# Patient Record
Sex: Female | Born: 1960
Health system: Southern US, Community
[De-identification: ages and names within clinical notes are randomized; demographics above are authoritative.]

## PROBLEM LIST (undated history)

## (undated) DIAGNOSIS — I471 Supraventricular tachycardia: Secondary | ICD-10-CM

## (undated) DIAGNOSIS — L732 Hidradenitis suppurativa: Secondary | ICD-10-CM

## (undated) DIAGNOSIS — R109 Unspecified abdominal pain: Secondary | ICD-10-CM

## (undated) DIAGNOSIS — E785 Hyperlipidemia, unspecified: Secondary | ICD-10-CM

## (undated) DIAGNOSIS — J329 Chronic sinusitis, unspecified: Secondary | ICD-10-CM

## (undated) DIAGNOSIS — I341 Nonrheumatic mitral (valve) prolapse: Secondary | ICD-10-CM

## (undated) DIAGNOSIS — N390 Urinary tract infection, site not specified: Secondary | ICD-10-CM

## (undated) DIAGNOSIS — R0600 Dyspnea, unspecified: Secondary | ICD-10-CM

## (undated) HISTORY — DX: Unspecified abdominal pain: R10.9

## (undated) HISTORY — DX: Dyspnea, unspecified: R06.00

## (undated) HISTORY — PX: HERNIA REPAIR: SHX51

## (undated) HISTORY — DX: Hidradenitis suppurativa: L73.2

## (undated) HISTORY — DX: Hyperlipidemia, unspecified: E78.5

## (undated) HISTORY — DX: Chronic sinusitis, unspecified: J32.9

## (undated) HISTORY — PX: CHOLECYSTECTOMY: SHX55

## (undated) HISTORY — PX: UMBILICAL HERNIA REPAIR: SHX196

## (undated) HISTORY — DX: Urinary tract infection, site not specified: N39.0

---

## 2006-03-11 ENCOUNTER — Encounter (INDEPENDENT_AMBULATORY_CARE_PROVIDER_SITE_OTHER): Payer: Self-pay | Admitting: Internal Medicine

## 2006-07-04 ENCOUNTER — Ambulatory Visit: Payer: Self-pay | Admitting: Internal Medicine

## 2006-07-19 ENCOUNTER — Ambulatory Visit: Payer: Self-pay | Admitting: Internal Medicine

## 2006-07-22 ENCOUNTER — Ambulatory Visit: Payer: Self-pay | Admitting: *Deleted

## 2006-07-30 ENCOUNTER — Ambulatory Visit: Payer: Self-pay | Admitting: Internal Medicine

## 2006-08-23 ENCOUNTER — Ambulatory Visit: Payer: Self-pay | Admitting: Internal Medicine

## 2006-08-23 DIAGNOSIS — D508 Other iron deficiency anemias: Secondary | ICD-10-CM | POA: Insufficient documentation

## 2006-08-23 DIAGNOSIS — E782 Mixed hyperlipidemia: Secondary | ICD-10-CM | POA: Insufficient documentation

## 2006-08-23 LAB — CONVERTED CEMR LAB
Glucose, Bld: 116 mg/dL
Hgb A1c MFr Bld: 7 %

## 2006-09-10 ENCOUNTER — Ambulatory Visit: Payer: Self-pay | Admitting: Internal Medicine

## 2006-09-23 ENCOUNTER — Ambulatory Visit: Payer: Self-pay | Admitting: Internal Medicine

## 2006-10-31 ENCOUNTER — Emergency Department (HOSPITAL_COMMUNITY): Admission: EM | Admit: 2006-10-31 | Discharge: 2006-10-31 | Payer: Self-pay | Admitting: Emergency Medicine

## 2006-10-31 DIAGNOSIS — R930 Abnormal findings on diagnostic imaging of skull and head, not elsewhere classified: Secondary | ICD-10-CM | POA: Insufficient documentation

## 2006-11-07 ENCOUNTER — Ambulatory Visit: Payer: Self-pay | Admitting: Internal Medicine

## 2006-11-07 LAB — CONVERTED CEMR LAB
Digitoxin Lvl: 0.4 ng/mL
LDL Cholesterol: 79 mg/dL

## 2006-11-11 ENCOUNTER — Ambulatory Visit (HOSPITAL_COMMUNITY): Admission: RE | Admit: 2006-11-11 | Discharge: 2006-11-11 | Payer: Self-pay | Admitting: Internal Medicine

## 2006-11-19 ENCOUNTER — Ambulatory Visit: Payer: Self-pay | Admitting: Internal Medicine

## 2006-11-19 DIAGNOSIS — I471 Supraventricular tachycardia, unspecified: Secondary | ICD-10-CM | POA: Insufficient documentation

## 2006-11-19 DIAGNOSIS — E119 Type 2 diabetes mellitus without complications: Secondary | ICD-10-CM

## 2006-11-19 DIAGNOSIS — E1159 Type 2 diabetes mellitus with other circulatory complications: Secondary | ICD-10-CM | POA: Insufficient documentation

## 2006-11-19 DIAGNOSIS — E320 Persistent hyperplasia of thymus: Secondary | ICD-10-CM | POA: Insufficient documentation

## 2006-11-19 DIAGNOSIS — I059 Rheumatic mitral valve disease, unspecified: Secondary | ICD-10-CM | POA: Insufficient documentation

## 2006-11-19 LAB — CONVERTED CEMR LAB: Digitoxin Lvl: 1.4 ng/mL

## 2006-12-19 ENCOUNTER — Ambulatory Visit: Payer: Self-pay | Admitting: Internal Medicine

## 2006-12-20 ENCOUNTER — Ambulatory Visit: Payer: Self-pay | Admitting: Internal Medicine

## 2006-12-26 ENCOUNTER — Ambulatory Visit: Payer: Self-pay | Admitting: Internal Medicine

## 2007-01-08 ENCOUNTER — Ambulatory Visit: Payer: Self-pay | Admitting: Family Medicine

## 2007-02-26 ENCOUNTER — Encounter (INDEPENDENT_AMBULATORY_CARE_PROVIDER_SITE_OTHER): Payer: Self-pay | Admitting: *Deleted

## 2007-03-03 ENCOUNTER — Telehealth (INDEPENDENT_AMBULATORY_CARE_PROVIDER_SITE_OTHER): Payer: Self-pay | Admitting: *Deleted

## 2007-03-18 ENCOUNTER — Ambulatory Visit: Payer: Self-pay | Admitting: Internal Medicine

## 2007-03-18 DIAGNOSIS — F341 Dysthymic disorder: Secondary | ICD-10-CM | POA: Insufficient documentation

## 2007-03-18 DIAGNOSIS — R350 Frequency of micturition: Secondary | ICD-10-CM | POA: Insufficient documentation

## 2007-03-18 LAB — CONVERTED CEMR LAB
Blood in Urine, dipstick: NEGATIVE
Ketones, urine, test strip: NEGATIVE
Protein, U semiquant: 30
Urobilinogen, UA: NEGATIVE
WBC Urine, dipstick: NEGATIVE
pH: 5.5

## 2007-03-25 LAB — CONVERTED CEMR LAB
Eosinophils Relative: 1 % (ref 0–5)
HCT: 38 % (ref 36.0–46.0)
Hemoglobin: 11.9 g/dL — ABNORMAL LOW (ref 12.0–15.0)
Lymphocytes Relative: 24 % (ref 12–46)
MCHC: 31.3 g/dL (ref 30.0–36.0)
MCV: 72.1 fL — ABNORMAL LOW (ref 78.0–100.0)
Neutro Abs: 8.9 10*3/uL — ABNORMAL HIGH (ref 1.7–7.7)
Platelets: 274 10*3/uL (ref 150–400)
RDW: 15.9 % — ABNORMAL HIGH (ref 11.5–14.0)

## 2007-03-28 ENCOUNTER — Ambulatory Visit: Payer: Self-pay | Admitting: Internal Medicine

## 2007-03-28 DIAGNOSIS — R143 Flatulence: Secondary | ICD-10-CM

## 2007-03-28 DIAGNOSIS — R142 Eructation: Secondary | ICD-10-CM

## 2007-03-28 DIAGNOSIS — R141 Gas pain: Secondary | ICD-10-CM | POA: Insufficient documentation

## 2007-03-28 DIAGNOSIS — B029 Zoster without complications: Secondary | ICD-10-CM | POA: Insufficient documentation

## 2007-03-28 LAB — CONVERTED CEMR LAB
Bilirubin Urine: NEGATIVE
Blood in Urine, dipstick: NEGATIVE
Protein, U semiquant: NEGATIVE
Specific Gravity, Urine: >=1.03
Urobilinogen, UA: 0.2
WBC Urine, dipstick: NEGATIVE
pH: 5

## 2007-04-28 ENCOUNTER — Telehealth (INDEPENDENT_AMBULATORY_CARE_PROVIDER_SITE_OTHER): Payer: Self-pay | Admitting: *Deleted

## 2007-04-28 ENCOUNTER — Telehealth (INDEPENDENT_AMBULATORY_CARE_PROVIDER_SITE_OTHER): Payer: Self-pay | Admitting: Nurse Practitioner

## 2007-06-26 ENCOUNTER — Encounter (INDEPENDENT_AMBULATORY_CARE_PROVIDER_SITE_OTHER): Payer: Self-pay | Admitting: Internal Medicine

## 2007-07-24 ENCOUNTER — Ambulatory Visit: Payer: Self-pay | Admitting: Internal Medicine

## 2007-07-24 LAB — CONVERTED CEMR LAB
Blood Glucose, Fingerstick: 139
Protein, U semiquant: 30
Specific Gravity, Urine: >=1.03
pH: 5

## 2007-07-26 ENCOUNTER — Encounter (INDEPENDENT_AMBULATORY_CARE_PROVIDER_SITE_OTHER): Payer: Self-pay | Admitting: Internal Medicine

## 2007-07-26 LAB — CONVERTED CEMR LAB
ALT: 33 units/L (ref 0–35)
AST: 31 units/L (ref 0–37)
Albumin: 3.8 g/dL (ref 3.5–5.2)
BUN: 7 mg/dL (ref 6–23)
Basophils Absolute: 0 10*3/uL (ref 0.0–0.1)
CO2: 25 meq/L (ref 19–32)
Calcium: 9.3 mg/dL (ref 8.4–10.5)
HCT: 38.4 % (ref 36.0–46.0)
HDL: 44 mg/dL (ref >39)
LDL Cholesterol: 71 mg/dL (ref 0–99)
Lymphocytes Relative: 20 % (ref 12–46)
Lymphs Abs: 2.3 10*3/uL (ref 0.7–4.0)
MCHC: 30.5 g/dL (ref 30.0–36.0)
MCV: 73.3 fL — ABNORMAL LOW (ref 78.0–100.0)
Potassium: 4.5 meq/L (ref 3.5–5.3)
RBC: 5.24 M/uL — ABNORMAL HIGH (ref 3.87–5.11)
Sodium: 139 meq/L (ref 135–145)
Total CHOL/HDL Ratio: 3.6
Triglycerides: 224 mg/dL — ABNORMAL HIGH (ref <150)
WBC: 11.5 10*3/uL — ABNORMAL HIGH (ref 4.0–10.5)

## 2007-07-31 ENCOUNTER — Telehealth (INDEPENDENT_AMBULATORY_CARE_PROVIDER_SITE_OTHER): Payer: Self-pay | Admitting: Internal Medicine

## 2007-10-21 ENCOUNTER — Ambulatory Visit: Payer: Self-pay | Admitting: Internal Medicine

## 2007-10-21 ENCOUNTER — Encounter (INDEPENDENT_AMBULATORY_CARE_PROVIDER_SITE_OTHER): Payer: Self-pay | Admitting: Internal Medicine

## 2007-10-21 DIAGNOSIS — R809 Proteinuria, unspecified: Secondary | ICD-10-CM | POA: Insufficient documentation

## 2007-10-21 LAB — CONVERTED CEMR LAB
Glucose, Urine, Semiquant: NEGATIVE
Hgb A1c MFr Bld: 6.8 %
Urobilinogen, UA: 0.2
pH: 5

## 2007-10-26 ENCOUNTER — Encounter (INDEPENDENT_AMBULATORY_CARE_PROVIDER_SITE_OTHER): Payer: Self-pay | Admitting: Internal Medicine

## 2007-10-26 LAB — CONVERTED CEMR LAB
Cholesterol: 162 mg/dL (ref 0–200)
Triglycerides: 225 mg/dL — ABNORMAL HIGH (ref <150)
VLDL: 45 mg/dL — ABNORMAL HIGH (ref 0–40)

## 2007-11-11 ENCOUNTER — Ambulatory Visit: Payer: Self-pay | Admitting: Family Medicine

## 2007-11-11 DIAGNOSIS — J209 Acute bronchitis, unspecified: Secondary | ICD-10-CM | POA: Insufficient documentation

## 2007-11-12 ENCOUNTER — Ambulatory Visit (HOSPITAL_COMMUNITY): Admission: RE | Admit: 2007-11-12 | Discharge: 2007-11-12 | Payer: Self-pay | Admitting: Internal Medicine

## 2007-11-21 ENCOUNTER — Ambulatory Visit: Payer: Self-pay | Admitting: Internal Medicine

## 2007-12-05 ENCOUNTER — Ambulatory Visit: Payer: Self-pay | Admitting: Internal Medicine

## 2007-12-11 ENCOUNTER — Ambulatory Visit: Payer: Self-pay | Admitting: Internal Medicine

## 2007-12-11 LAB — CONVERTED CEMR LAB: Blood Glucose, Fingerstick: 131

## 2008-01-13 ENCOUNTER — Ambulatory Visit: Payer: Self-pay | Admitting: Internal Medicine

## 2008-02-27 ENCOUNTER — Ambulatory Visit: Payer: Self-pay | Admitting: Internal Medicine

## 2008-04-16 ENCOUNTER — Ambulatory Visit: Payer: Self-pay | Admitting: Internal Medicine

## 2008-04-16 LAB — CONVERTED CEMR LAB
Blood Glucose, Fingerstick: 144
Blood in Urine, dipstick: NEGATIVE
Calcium: 9.7 mg/dL (ref 8.4–10.5)
Chloride: 103 meq/L (ref 96–112)
Cholesterol: 166 mg/dL (ref 0–200)
Digitoxin Lvl: 0.6 ng/mL — ABNORMAL LOW (ref 0.8–2.0)
Glucose, Urine, Semiquant: NEGATIVE
HDL: 48 mg/dL (ref >39)
LDL Cholesterol: 73 mg/dL (ref 0–99)
Sodium: 139 meq/L (ref 135–145)
Total CHOL/HDL Ratio: 3.5
VLDL: 45 mg/dL — ABNORMAL HIGH (ref 0–40)

## 2008-04-22 ENCOUNTER — Telehealth (INDEPENDENT_AMBULATORY_CARE_PROVIDER_SITE_OTHER): Payer: Self-pay | Admitting: Internal Medicine

## 2008-06-11 DIAGNOSIS — D259 Leiomyoma of uterus, unspecified: Secondary | ICD-10-CM | POA: Insufficient documentation

## 2008-07-06 ENCOUNTER — Ambulatory Visit: Payer: Self-pay | Admitting: Internal Medicine

## 2008-07-06 DIAGNOSIS — K5289 Other specified noninfective gastroenteritis and colitis: Secondary | ICD-10-CM | POA: Insufficient documentation

## 2008-07-06 DIAGNOSIS — N760 Acute vaginitis: Secondary | ICD-10-CM | POA: Insufficient documentation

## 2008-07-06 LAB — CONVERTED CEMR LAB
Bilirubin Urine: NEGATIVE
Chlamydia, DNA Probe: NEGATIVE
GC Probe Amp, Genital: NEGATIVE
Glucose, Urine, Semiquant: NEGATIVE
Specific Gravity, Urine: >=1.03
pH: 5

## 2008-07-15 ENCOUNTER — Telehealth (INDEPENDENT_AMBULATORY_CARE_PROVIDER_SITE_OTHER): Payer: Self-pay | Admitting: Internal Medicine

## 2008-07-26 ENCOUNTER — Telehealth (INDEPENDENT_AMBULATORY_CARE_PROVIDER_SITE_OTHER): Payer: Self-pay | Admitting: Internal Medicine

## 2008-07-29 ENCOUNTER — Ambulatory Visit: Payer: Self-pay | Admitting: Internal Medicine

## 2008-07-29 DIAGNOSIS — B9789 Other viral agents as the cause of diseases classified elsewhere: Secondary | ICD-10-CM | POA: Insufficient documentation

## 2008-07-29 LAB — CONVERTED CEMR LAB
Blood Glucose, Fingerstick: 137
Hgb A1c MFr Bld: 6.8 %

## 2008-09-23 ENCOUNTER — Telehealth (INDEPENDENT_AMBULATORY_CARE_PROVIDER_SITE_OTHER): Payer: Self-pay | Admitting: Internal Medicine

## 2008-09-24 ENCOUNTER — Ambulatory Visit: Payer: Self-pay | Admitting: Internal Medicine

## 2008-09-24 LAB — CONVERTED CEMR LAB

## 2008-10-03 ENCOUNTER — Encounter (INDEPENDENT_AMBULATORY_CARE_PROVIDER_SITE_OTHER): Payer: Self-pay | Admitting: Internal Medicine

## 2008-10-06 ENCOUNTER — Telehealth (INDEPENDENT_AMBULATORY_CARE_PROVIDER_SITE_OTHER): Payer: Self-pay | Admitting: *Deleted

## 2008-12-10 ENCOUNTER — Ambulatory Visit: Payer: Self-pay | Admitting: Internal Medicine

## 2008-12-10 LAB — CONVERTED CEMR LAB: Blood Glucose, Fingerstick: 195

## 2008-12-17 ENCOUNTER — Ambulatory Visit: Payer: Self-pay | Admitting: Internal Medicine

## 2008-12-17 LAB — CONVERTED CEMR LAB
Albumin: 3.6 g/dL (ref 3.5–5.2)
HDL: 49 mg/dL (ref >39)
Indirect Bilirubin: 0.3 mg/dL (ref 0.0–0.9)
Triglycerides: 254 mg/dL — ABNORMAL HIGH (ref <150)

## 2008-12-23 ENCOUNTER — Encounter (INDEPENDENT_AMBULATORY_CARE_PROVIDER_SITE_OTHER): Payer: Self-pay | Admitting: Internal Medicine

## 2008-12-23 ENCOUNTER — Telehealth (INDEPENDENT_AMBULATORY_CARE_PROVIDER_SITE_OTHER): Payer: Self-pay | Admitting: Internal Medicine

## 2009-01-18 ENCOUNTER — Inpatient Hospital Stay (HOSPITAL_COMMUNITY): Admission: AD | Admit: 2009-01-18 | Discharge: 2009-01-19 | Payer: Self-pay | Admitting: Obstetrics & Gynecology

## 2009-04-22 ENCOUNTER — Encounter (INDEPENDENT_AMBULATORY_CARE_PROVIDER_SITE_OTHER): Payer: Self-pay | Admitting: Internal Medicine

## 2009-04-22 ENCOUNTER — Ambulatory Visit: Payer: Self-pay | Admitting: Internal Medicine

## 2009-04-22 LAB — CONVERTED CEMR LAB
Blood Glucose, Fingerstick: 117
Blood in Urine, dipstick: NEGATIVE
Glucose, Urine, Semiquant: 250
Hgb A1c MFr Bld: 7.1 %
Ketones, urine, test strip: NEGATIVE
Nitrite: NEGATIVE
Protein, U semiquant: 30
Urobilinogen, UA: 0.2
pH: 5

## 2009-04-28 ENCOUNTER — Ambulatory Visit: Payer: Self-pay | Admitting: Internal Medicine

## 2009-04-28 LAB — CONVERTED CEMR LAB
ALT: 18 units/L (ref 0–35)
BUN: 9 mg/dL (ref 6–23)
Basophils Absolute: 0 10*3/uL (ref 0.0–0.1)
CO2: 24 meq/L (ref 19–32)
Calcium: 8.8 mg/dL (ref 8.4–10.5)
Eosinophils Absolute: 0.1 10*3/uL (ref 0.0–0.7)
HCT: 37.6 % (ref 36.0–46.0)
Lymphocytes Relative: 22 % (ref 12–46)
MCHC: 29.5 g/dL — ABNORMAL LOW (ref 30.0–36.0)
MCV: 75.7 fL — ABNORMAL LOW (ref 78.0–100.0)
Monocytes Absolute: 0.5 10*3/uL (ref 0.1–1.0)
Monocytes Relative: 4 % (ref 3–12)
Neutro Abs: 9.1 10*3/uL — ABNORMAL HIGH (ref 1.7–7.7)
Neutrophils Relative %: 73 % (ref 43–77)
Potassium: 5.2 meq/L (ref 3.5–5.3)
RBC: 4.97 M/uL (ref 3.87–5.11)
Sodium: 142 meq/L (ref 135–145)
Total Protein: 6.7 g/dL (ref 6.0–8.3)

## 2009-05-10 ENCOUNTER — Telehealth (INDEPENDENT_AMBULATORY_CARE_PROVIDER_SITE_OTHER): Payer: Self-pay | Admitting: Internal Medicine

## 2009-05-10 ENCOUNTER — Ambulatory Visit (HOSPITAL_COMMUNITY): Admission: RE | Admit: 2009-05-10 | Discharge: 2009-05-10 | Payer: Self-pay | Admitting: Internal Medicine

## 2009-05-12 ENCOUNTER — Ambulatory Visit: Payer: Self-pay | Admitting: Family Medicine

## 2009-05-12 DIAGNOSIS — J301 Allergic rhinitis due to pollen: Secondary | ICD-10-CM | POA: Insufficient documentation

## 2009-05-31 ENCOUNTER — Ambulatory Visit: Payer: Self-pay | Admitting: Internal Medicine

## 2009-05-31 LAB — CONVERTED CEMR LAB
Bilirubin Urine: NEGATIVE
Glucose, Urine, Semiquant: NEGATIVE
Ketones, urine, test strip: NEGATIVE
Nitrite: NEGATIVE
Specific Gravity, Urine: >=1.03
WBC Urine, dipstick: NEGATIVE
Whiff Test: POSITIVE

## 2009-06-01 ENCOUNTER — Encounter (INDEPENDENT_AMBULATORY_CARE_PROVIDER_SITE_OTHER): Payer: Self-pay | Admitting: Internal Medicine

## 2009-08-26 ENCOUNTER — Emergency Department (HOSPITAL_COMMUNITY): Admission: EM | Admit: 2009-08-26 | Discharge: 2009-08-26 | Payer: Self-pay | Admitting: Emergency Medicine

## 2009-11-25 ENCOUNTER — Ambulatory Visit: Payer: Self-pay | Admitting: Internal Medicine

## 2009-11-25 DIAGNOSIS — K5900 Constipation, unspecified: Secondary | ICD-10-CM | POA: Insufficient documentation

## 2009-11-25 DIAGNOSIS — R109 Unspecified abdominal pain: Secondary | ICD-10-CM | POA: Insufficient documentation

## 2009-11-25 DIAGNOSIS — D72829 Elevated white blood cell count, unspecified: Secondary | ICD-10-CM | POA: Insufficient documentation

## 2009-11-25 DIAGNOSIS — F17200 Nicotine dependence, unspecified, uncomplicated: Secondary | ICD-10-CM | POA: Insufficient documentation

## 2009-11-25 LAB — CONVERTED CEMR LAB
Bilirubin Urine: NEGATIVE
Blood in Urine, dipstick: NEGATIVE
Ketones, urine, test strip: NEGATIVE
Nitrite: NEGATIVE
Protein, U semiquant: NEGATIVE
Rapid HIV Screen: NEGATIVE
Specific Gravity, Urine: >=1.03
WBC Urine, dipstick: NEGATIVE
Whiff Test: NEGATIVE
pH: 5

## 2009-11-29 DIAGNOSIS — N72 Inflammatory disease of cervix uteri: Secondary | ICD-10-CM | POA: Insufficient documentation

## 2009-11-29 LAB — CONVERTED CEMR LAB
Basophils Absolute: 0 10*3/uL (ref 0.0–0.1)
Basophils Relative: 0 % (ref 0–1)
Chlamydia, Swab/Urine, PCR: POSITIVE — AB
Cholesterol: 174 mg/dL (ref 0–200)
Eosinophils Absolute: 0.2 10*3/uL (ref 0.0–0.7)
Eosinophils Relative: 1 % (ref 0–5)
GC Probe Amp, Urine: NEGATIVE
HCT: 37.8 % (ref 36.0–46.0)
HDL: 46 mg/dL (ref >39)
Hemoglobin: 11.4 g/dL — ABNORMAL LOW (ref 12.0–15.0)
Hgb A1c MFr Bld: 7 % — ABNORMAL HIGH (ref <5.7)
LDL Cholesterol: 83 mg/dL (ref 0–99)
Lymphocytes Relative: 20 % (ref 12–46)
Lymphs Abs: 2.4 10*3/uL (ref 0.7–4.0)
MCHC: 30.2 g/dL (ref 30.0–36.0)
MCV: 73.5 fL — ABNORMAL LOW (ref 78.0–100.0)
Monocytes Absolute: 0.5 10*3/uL (ref 0.1–1.0)
Monocytes Relative: 4 % (ref 3–12)
Neutro Abs: 9 10*3/uL — ABNORMAL HIGH (ref 1.7–7.7)
Neutrophils Relative %: 75 % (ref 43–77)
Platelets: 268 10*3/uL (ref 150–400)
RBC: 5.14 M/uL — ABNORMAL HIGH (ref 3.87–5.11)
RDW: 16.4 % — ABNORMAL HIGH (ref 11.5–15.5)
Total CHOL/HDL Ratio: 3.8
Triglycerides: 224 mg/dL — ABNORMAL HIGH (ref <150)
VLDL: 45 mg/dL — ABNORMAL HIGH (ref 0–40)
WBC: 12.1 10*3/uL — ABNORMAL HIGH (ref 4.0–10.5)

## 2009-11-30 ENCOUNTER — Telehealth (INDEPENDENT_AMBULATORY_CARE_PROVIDER_SITE_OTHER): Payer: Self-pay | Admitting: Internal Medicine

## 2009-12-09 ENCOUNTER — Telehealth (INDEPENDENT_AMBULATORY_CARE_PROVIDER_SITE_OTHER): Payer: Self-pay | Admitting: Internal Medicine

## 2009-12-10 ENCOUNTER — Emergency Department (HOSPITAL_COMMUNITY): Admission: EM | Admit: 2009-12-10 | Discharge: 2009-12-10 | Payer: Self-pay | Admitting: Emergency Medicine

## 2009-12-26 ENCOUNTER — Encounter (INDEPENDENT_AMBULATORY_CARE_PROVIDER_SITE_OTHER): Payer: Self-pay | Admitting: Internal Medicine

## 2010-01-06 ENCOUNTER — Ambulatory Visit: Payer: Self-pay | Admitting: Internal Medicine

## 2010-01-06 DIAGNOSIS — R609 Edema, unspecified: Secondary | ICD-10-CM | POA: Insufficient documentation

## 2010-01-06 DIAGNOSIS — M722 Plantar fascial fibromatosis: Secondary | ICD-10-CM | POA: Insufficient documentation

## 2010-01-06 LAB — CONVERTED CEMR LAB: Blood Glucose, Fingerstick: 199

## 2010-01-09 ENCOUNTER — Encounter (INDEPENDENT_AMBULATORY_CARE_PROVIDER_SITE_OTHER): Payer: Self-pay | Admitting: Internal Medicine

## 2010-01-12 LAB — CONVERTED CEMR LAB
Eosinophils Absolute: 0.2 10*3/uL (ref 0.0–0.7)
GC Probe Amp, Genital: NEGATIVE
Lymphocytes Relative: 24 % (ref 12–46)
Lymphs Abs: 3 10*3/uL (ref 0.7–4.0)
Monocytes Absolute: 0.5 10*3/uL (ref 0.1–1.0)
Monocytes Relative: 4 % (ref 3–12)
RDW: 16.5 % — ABNORMAL HIGH (ref 11.5–15.5)

## 2010-01-26 LAB — CONVERTED CEMR LAB
Retic Ct Pct: 1.2 % (ref 0.4–3.1)
Saturation Ratios: 8 % — ABNORMAL LOW (ref 20–55)
TIBC: 389 ug/dL (ref 250–470)
UIBC: 357 ug/dL

## 2010-02-21 ENCOUNTER — Telehealth (INDEPENDENT_AMBULATORY_CARE_PROVIDER_SITE_OTHER): Payer: Self-pay | Admitting: Internal Medicine

## 2010-02-22 ENCOUNTER — Ambulatory Visit: Payer: Self-pay | Admitting: Internal Medicine

## 2010-02-22 LAB — CONVERTED CEMR LAB: Chlamydia, DNA Probe: NEGATIVE

## 2010-02-23 ENCOUNTER — Telehealth (INDEPENDENT_AMBULATORY_CARE_PROVIDER_SITE_OTHER): Payer: Self-pay | Admitting: Internal Medicine

## 2010-03-02 ENCOUNTER — Encounter (INDEPENDENT_AMBULATORY_CARE_PROVIDER_SITE_OTHER): Payer: Self-pay | Admitting: Internal Medicine

## 2010-03-31 ENCOUNTER — Ambulatory Visit: Payer: Self-pay | Admitting: Internal Medicine

## 2010-03-31 DIAGNOSIS — R5383 Other fatigue: Secondary | ICD-10-CM

## 2010-03-31 DIAGNOSIS — R5381 Other malaise: Secondary | ICD-10-CM | POA: Insufficient documentation

## 2010-03-31 LAB — CONVERTED CEMR LAB
Blood Glucose, Fingerstick: 121
Glucose, Urine, Semiquant: NEGATIVE

## 2010-05-12 ENCOUNTER — Ambulatory Visit: Payer: Self-pay | Admitting: Internal Medicine

## 2010-05-12 ENCOUNTER — Telehealth (INDEPENDENT_AMBULATORY_CARE_PROVIDER_SITE_OTHER): Payer: Self-pay | Admitting: Internal Medicine

## 2010-05-16 LAB — CONVERTED CEMR LAB
ALT: 16 units/L (ref 0–35)
Albumin: 3.9 g/dL (ref 3.5–5.2)
Alkaline Phosphatase: 87 units/L (ref 39–117)
Chloride: 104 meq/L (ref 96–112)
Creatinine, Ser: 0.63 mg/dL (ref 0.40–1.20)
Hemoglobin: 11.5 g/dL — ABNORMAL LOW (ref 12.0–15.0)
MCHC: 31.4 g/dL (ref 30.0–36.0)
MCV: 71.6 fL — ABNORMAL LOW (ref 78.0–100.0)
Microalb Creat Ratio: 4.7 mg/g (ref 0.0–30.0)
Neutro Abs: 9.4 10*3/uL — ABNORMAL HIGH (ref 1.7–7.7)
Neutrophils Relative %: 68 % (ref 43–77)
Platelets: 296 10*3/uL (ref 150–400)
Potassium: 4.3 meq/L (ref 3.5–5.3)
RBC: 5.11 M/uL (ref 3.87–5.11)
Total Bilirubin: 0.3 mg/dL (ref 0.3–1.2)
Total Protein: 6.7 g/dL (ref 6.0–8.3)
WBC: 13.7 10*3/uL — ABNORMAL HIGH (ref 4.0–10.5)

## 2010-05-24 ENCOUNTER — Emergency Department (HOSPITAL_COMMUNITY)
Admission: EM | Admit: 2010-05-24 | Discharge: 2010-05-25 | Payer: Self-pay | Source: Home / Self Care | Admitting: Emergency Medicine

## 2010-06-09 ENCOUNTER — Ambulatory Visit (HOSPITAL_COMMUNITY)
Admission: RE | Admit: 2010-06-09 | Discharge: 2010-06-09 | Payer: Self-pay | Source: Home / Self Care | Attending: Internal Medicine | Admitting: Internal Medicine

## 2010-06-14 ENCOUNTER — Encounter (INDEPENDENT_AMBULATORY_CARE_PROVIDER_SITE_OTHER): Payer: Self-pay | Admitting: Internal Medicine

## 2010-06-16 ENCOUNTER — Encounter
Admission: RE | Admit: 2010-06-16 | Discharge: 2010-06-16 | Payer: Self-pay | Source: Home / Self Care | Attending: Internal Medicine | Admitting: Internal Medicine

## 2010-06-22 ENCOUNTER — Ambulatory Visit: Admit: 2010-06-22 | Payer: Self-pay | Admitting: Internal Medicine

## 2010-07-11 NOTE — Letter (Signed)
Summary: [facility]   Imported By: Arta Bruce 03/02/2010 10:36:14  _____________________________________________________________________  External Attachment:    Type:   Image     Comment:   External Document

## 2010-07-11 NOTE — Assessment & Plan Note (Signed)
Summary: Needs to be tested for STDs // tl  Nurse Visit   Impression & Recommendations:  Problem # 1:  SEXUAL ACTIVITY, HIGH RISK (ICD-V69.2)  Orders: T- GC Chlamydia (78295)  Complete Medication List: 1)  Metformin Hcl 500 Mg Tabs (Metformin hcl) .... 2 tabs by mouth two times a day with meals 2)  Atenolol 25 Mg Tabs (Atenolol) .Marland Kitchen.. 1 tab by mouth daily 3)  Lisinopril 5 Mg Tabs (Lisinopril) .Marland Kitchen.. 1 tab by mouth daily 4)  Lanoxin 0.25 Mg Tabs (Digoxin) .Marland Kitchen.. 1 1/2 tabs by mouth daily 5)  Claritin 10 Mg Tabs (Loratadine) .... Take 1 tablet by mouth once a day as needed runnynose,itching,sneezing 6)  Lovaza 1 Gm Caps (Omega-3-acid ethyl esters) .... 4 caps by mouth daily 7)  Glucometer  .... Once daily testing 8)  Glucometer Test Strips  .... Once daily glucose testing 9)  Metronidazole 500 Mg Tabs (Metronidazole) .Marland Kitchen.. 1 tab by mouth two times a day for 7 days 10)  Miralax Powd (Polyethylene glycol 3350) .Marland KitchenMarland KitchenMarland Kitchen 17 g in 8 oz water by mouth daily 11)  Azithromycin 250 Mg Tabs (Azithromycin) .... 4 tabs by mouth for one dose 12)  Nu-iron 150 Mg Caps (Polysaccharide iron complex) .Marland Kitchen.. 1 tab by mouth daily   Patient Instructions: 1)  We will notify you when the lab results come back. 2)  You should always practice safe sex; condoms have been provided to help you with that. 3)  Call this office if symptoms worsen.   Review of Systems       c/o abdominal cramping GU:  Complains of discharge.  CC: Thinks she has chlamydia, having abdominal cramping   Primary Care Cyndal Kasson:  Julieanne Manson MD  CC:  Thinks she has chlamydia and having abdominal cramping.  History of Present Illness: Having abdominal cramping, states she had chlamydia about a month ago, thinks it's come back.  Also having bloating and fullness, slight discharge, yellowish.  Doesn't want to get a pap, just a wet prep    Physical Exam  General:  alert, well-developed, well-nourished, and well-hydrated.      Allergies: 1)  Doxycycline Hyclate (Doxycycline Hyclate)  Orders Added: 1)  Est. Patient Level I [62130] 2)  T- GC Chlamydia [86578]

## 2010-07-11 NOTE — Miscellaneous (Signed)
Summary: Diabetic eye check:  12/26/09  Clinical Lists Changes  Observations: Added new observation of DBT EY CK DT: [facility]--Dr. Willis Modena:  No diabetic retinopathy (12/26/2009 9:18)

## 2010-07-11 NOTE — Progress Notes (Signed)
Summary: TESTED FOR STD ASAP  Phone Note Call from Patient Call back at [phone]   Caller: Patient Reason for Call: Acute Illness Complaint: Urinary/GYN Problems Summary of Call: PT THINKS SHE GOT AN STD AND WANTS TO GET TESTED FOR IT, SHE IS CRAMPING ALOT  Initial call taken by: Oscar La,  February 21, 2010 3:52 PM  Follow-up for Phone Call        Having abdominal cramping, had chlamydia about a month ago, thinks it's come back.  Also having bloating and fullness, slight discharge, yellowish.  Doesn't want to get a pap, just a wet prep and maybe a U/A.  Given triage appt. for 02/22/10. Follow-up by: Dutch Quint RN,  February 21, 2010 4:28 PM  Additional Follow-up for Phone Call Additional follow up Details #1::        In office.  Dutch Quint RN  February 22, 2010 5:07 PM

## 2010-07-11 NOTE — Progress Notes (Signed)
Summary: Needs another glucose meter  Phone Note Call from Patient   Summary of Call: Pt. states that she lost her meter and needs another one. Initial call taken by: Dutch Quint RN,  February 23, 2010 10:45 AM  Follow-up for Phone Call        Rx sent. Let her know Follow-up by: Julieanne Manson MD,  February 23, 2010 11:00 PM  Additional Follow-up for Phone Call Additional follow up Details #1::        MS Gambrell ALSO CALLING TO GET HER LAB RESULTS Additional Follow-up by: Leodis Rains,   February 24, 2010 5:01 PM    Additional Follow-up for Phone Call Additional follow up Details #2::    pt aware of lab results [facility]  February 27, 2010 11:05 AM   New/Updated Medications: WAVESENSE PRESTO W/DEVICE KIT (BLOOD GLUCOSE MONITORING SUPPL) daily testing of blood sugar WAVESENSE PRESTO TEST  STRP (GLUCOSE BLOOD) daily sugar testing Prescriptions: WAVESENSE PRESTO TEST  STRP (GLUCOSE BLOOD) daily sugar testing  #30 x 11   Entered and Authorized by:   Julieanne Manson MD   Signed by:   Julieanne Manson MD on 02/23/2010   Method used:   Faxed to ...       [facility] - [facility] (retail)       [address] [place], [place]  [address]       Ph: [phone]       Fax: [phone]   RxID:   [id number] WAVESENSE PRESTO W/DEVICE KIT (BLOOD GLUCOSE MONITORING SUPPL) daily testing of blood sugar  #1 x 0   Entered and Authorized by:   Julieanne Manson MD   Signed by:   Julieanne Manson MD on 02/23/2010   Method used:   Faxed to ...       [facility] - [facility] (retail)       [address] [place], [place]  [address]       Ph: [phone]       Fax: [phone]   RxID:   [id number]

## 2010-07-11 NOTE — Progress Notes (Signed)
  Medications Added AZITHROMYCIN 250 MG TABS (AZITHROMYCIN) 4 tabs by mouth for one dose       Phone Note Outgoing Call   Summary of Call: Called pt--cannot get Wellbutrin any longer--she plans to try patches--starting on 14 mg for 1 month, then 7 mg fo 2 weeks.  Stop smoking day 1. Let her know about chlamydia and to get the Doxy and have partner treated, other labs and need to get CBC scheduled in 2 weeks--please call to schedule the latter tomorrow--see append to labs if this does not make sense Initial call taken by: Julieanne Manson MD,  November 30, 2009 9:33 PM  Follow-up for Phone Call        No answer at [phone]. Follow-up by: Vesta Mixer CMA,  December 01, 2009 11:48 AM  Additional Follow-up for Phone Call Additional follow up Details #1::        No answer still......... Tiffany McCoy CMA  December 02, 2009 10:00 AM     Additional Follow-up for Phone Call Additional follow up Details #2::    Pt aware, also she has been itching a lot from the doxy, but she is still taking it.  She is taking bendryl with it.  Also in regards to rechecking her cbc in 2 weeks, she has not been taking her iron while on the antibx because it said to not take any vitamins while on it.  So she won't start back on iron until Sunday, July 3.  Should she still come on 7/6 to recheck cbc? Follow-up by: Tiffany McCoy CMA,  December 05, 2009 4:53 PM  Additional Follow-up for Phone Call Additional follow up Details #3:: Details for Additional Follow-up Action Taken: Stop Doxycycline. To pick up 1 gm of Azithromycin from [facility] tomorrow and take all of it as one dose. Reschedule CBC in 1 month Giannis Corpuz MD  December 06, 2009 5:01 PM   Pt aware and appt scheduled. Additional Follow-up by: Tiffany McCoy CMA,  December 06, 2009 5:03 PM  New/Updated Medications: AZITHROMYCIN 250 MG TABS (AZITHROMYCIN) 4 tabs by mouth for one dose Prescriptions: AZITHROMYCIN 250 MG TABS (AZITHROMYCIN) 4 tabs by mouth  for one dose  #4 x 0   Entered and Authorized by:   Tephanie Escorcia MD   Signed by:   Syrus Nakama MD on 12/06/2009   Method used:   Faxed to ...       [facility] - [facility] (retail)       [address] [place], [place]  [address]       Ph: [phone]       Fax: [phone]   RxID:   [id number]

## 2010-07-11 NOTE — Progress Notes (Signed)
Summary: constipation  Phone Note Call from Patient   Summary of Call: pt in office for lab draw and has been having alot of constipation and she wants to know what she can do to get some relief.  Please call her ASAP. Initial call taken by: Levon Hedger,  May 12, 2010 5:12 PM  Follow-up for Phone Call        Drink 6 glasses of water daily, Eat 5 servings of fruits and veggies daily.  Take Metamucil or Citrucel in 8 oz water daily.  Avoid a lot of starches. Follow-up by: Julieanne Manson MD,  May 16, 2010 6:14 PM  Additional Follow-up for Phone Call Additional follow up Details #1::        left message on machine for pt to return call to the office. Levon Hedger  May 18, 2010 12:32 PM  Levon Hedger  May 18, 2010 12:49 PM pt informed of above information.  Additional Follow-up by: Levon Hedger,  May 18, 2010 12:50 PM

## 2010-07-11 NOTE — Assessment & Plan Note (Signed)
Summary: FU WITH DR Kiernan Atkerson IN 6 WKSCBC//GK   Vital Signs:  Patient profile:   50 year old female Menstrual status:  regular Weight:      256 pounds Temp:     98.2 degrees F Pulse rate:   76 / minute Pulse rhythm:   regular Resp:     18 per minute BP sitting:   118 / 80  (left arm) Cuff size:   large  Vitals Entered By: Vesta Mixer CMA (January 06, 2010 4:02 PM) CC: f/u smoking cessation and constipation no better with either one. Is Patient Diabetic? Yes CBG Result 199  Does patient need assistance? Ambulation Normal   Primary Care Provider:  Julieanne Manson MD  CC:  f/u smoking cessation and constipation no better with either one.Marland Kitchen  History of Present Illness: 1.  Constipation:  Never tried the MIralax.  Wants to try a natural body cleanser that her sister recommends instead.  Just started the body cleanser.  Unsurprisingly, not improved.  2.  Feet and legs swelling:  More so in evening.  Better after sleeping.  Not really watching sodium intake.   Has noted this just recently--but then adds was at [employer] walking around and was on an airplane for travel to [place] and back--just returned.  3.  Bottoms of feet started hurting for a bout 1 month.  On hard floors a lot.  Wears black flats a lot.  Barefoot at times at home.  Floors carpeted at home.    4.  Tobacco Abuse: Could not get the Wellbutrin.  Has not started the patches.  Allergies (verified): 1)  Doxycycline Hyclate (Doxycycline Hyclate)  Physical Exam  General:  Obese, NAD Lungs:  Normal respiratory effort, chest expands symmetrically. Lungs are clear to auscultation, no crackles or wheezes. Heart:  Normal rate and regular rhythm. S1 and S2 normal without gallop, murmur, click, rub or other extra sounds.  Radial pulses normal and equal Abdomen:  soft and non-tender.   Extremities:  Tender on plantar aspect of heels--right more so than left No edema of legs today   Impression &  Recommendations:  Problem # 1:  CONSTIPATION (ICD-564.00) Discussed trying Miralax if her bowel cleansing plan not successful. Her updated medication list for this problem includes:    Miralax Powd (Polyethylene glycol 3350) .Marland KitchenMarland KitchenMarland KitchenMarland Kitchen 17 g in 8 oz water by mouth daily  Problem # 2:  Hx of EDEMA (ICD-782.3) No findings today Discussed avoiding sodium rich foods and added sodium, elevation of legs, weight loss as ways of avoiding edema.   Suspect also related to being on feet for extended periods of time in the heat and with air travel.  Problem # 3:  PLANTAR FASCIITIS, BILATERAL (ICD-728.71) Discussed getting Heel cups bilaterally and not to wear Mule shoes No barefootedness when bearing weight Stretches two times a day  To call if does not resolve and will consider PT  Problem # 4:  CERVICITIS, CHLAMYDIAL (ICD-616.0) Recheck to see if cleared--self swab Orders: T- GC Chlamydia (04540)  Problem # 5:  TOBACCO ABUSE (ICD-305.1) Nicotine patches  Complete Medication List: 1)  Metformin Hcl 500 Mg Tabs (Metformin hcl) .... 2 tabs by mouth two times a day with meals 2)  Atenolol 25 Mg Tabs (Atenolol) .Marland Kitchen.. 1 tab by mouth daily 3)  Lisinopril 5 Mg Tabs (Lisinopril) .Marland Kitchen.. 1 tab by mouth daily 4)  Lanoxin 0.25 Mg Tabs (Digoxin) .Marland Kitchen.. 1 1/2 tabs by mouth daily 5)  Claritin 10 Mg Tabs (Loratadine) .Marland KitchenMarland KitchenMarland Kitchen  Take 1 tablet by mouth once a day as needed runnynose,itching,sneezing 6)  Lovaza 1 Gm Caps (Omega-3-acid ethyl esters) .... 4 caps by mouth daily 7)  Glucometer  .... Once daily testing 8)  Glucometer Test Strips  .... Once daily glucose testing 9)  Metronidazole 500 Mg Tabs (Metronidazole) .Marland Kitchen.. 1 tab by mouth two times a day for 7 days 10)  Miralax Powd (Polyethylene glycol 3350) .Marland KitchenMarland KitchenMarland Kitchen 17 g in 8 oz water by mouth daily 11)  Azithromycin 250 Mg Tabs (Azithromycin) .... 4 tabs by mouth for one dose  Other Orders: Capillary Blood Glucose/CBG (34742) T-CBC w/Diff (59563-87564)  Patient  Instructions: 1)  Need to use heel cups in all shoes--get at medical supply company 2)  Get started on nicotine patches 3)  Follow up with Dr. Delrae Alfred in 3 months--heels

## 2010-07-11 NOTE — Assessment & Plan Note (Signed)
Summary: FU ON HER DIABETES///KT   Vital Signs:  Patient profile:   50 year old female Menstrual status:  regular Weight:      251.4 pounds Temp:     97.4 degrees F oral Pulse rate:   68 / minute Pulse rhythm:   regular Resp:     18 per minute BP sitting:   116 / 78  (left arm) Cuff size:   large  Vitals Entered By: Michelle Nasuti (March 31, 2010 4:01 PM) CC: RECENT LIFESTYLES CHANGES. MULTIPLE CONCERNS WITH FAMILY OVER THE PAST TWO WEEKS. SINCE CHANGES IN LIFESTYLE PT C/O INCREASE IN FATIGUE AND INCREASE IN HA Is Patient Diabetic? Yes Pain Assessment Patient in pain? yes      Intensity: 6 Type: aching CBG Result 121 CBG Device ID MON B [id number]  Does patient need assistance? Ambulation Normal   Primary Care Kevan Prouty:  Julieanne Manson MD  CC:  RECENT LIFESTYLES CHANGES. MULTIPLE CONCERNS WITH FAMILY OVER THE PAST TWO WEEKS. SINCE CHANGES IN LIFESTYLE PT C/O INCREASE IN FATIGUE AND INCREASE IN HA.  History of Present Illness: 1.  DM:  Never picked up glucometer, so does not know what her sugars have been running. No dry mouth.  No polyuria.  Does get dizzy on an occasion and wonders if sugar is low.  Has not had flu vaccine--not sure if she wants.  Our lab reports out a 5.5%  2.  Plantar fasciitis:  has been doing exercise intermittently.  Did not get heel cups.    3.  Social issues:  youngest daughter recently had a "nervous breakdown"  Was hospitalized for 1 week--psychotic episode and on Abilify.  Not clear if felt related to depression.  Mother in hospital with anemia and DVT in [place].  Not certain she feels she needs counseling.  4.  Tobacco Abuse:  with all of difficulties recently, now smoking more.    5.  Fatigue:  for past 3 days.  6.  Hyperlipidemia:  not taking Lovaza--cannot say why stopped.  Also not taking Lisinopril--again, just stopped taking.  Allergies: 1)  Doxycycline Hyclate (Doxycycline Hyclate)  Physical Exam  General:  Morbidly obese. Neck:  No  deformities, masses, or tenderness noted. Lungs:  Normal respiratory effort, chest expands symmetrically. Lungs are clear to auscultation, no crackles or wheezes. Heart:  Normal rate and regular rhythm. S1 and S2 normal without gallop, murmur, click, rub or other extra sounds.  Radial pulses normal and equal. Abdomen:  Bowel sounds positive,abdomen soft and non-tender without masses, organomegaly or hernias noted.   Impression & Recommendations:  Problem # 1:  FATIGUE (ICD-780.79) Possibly early viral illness Orders: T- Hemoglobin A1C (35573-22025) T-Comprehensive Metabolic Panel (42706-23762) T-CBC No Diff (83151-76160) T-TSH (73710-62694)  Problem # 2:  PLANTAR FASCIITIS, BILATERAL (ICD-728.71) IMproved with exercises--encouraged heel cups  Problem # 3:  TOBACCO ABUSE (ICD-305.1) Encouraged pt. to quit. To call [facility] cessation program  Problem # 4:  DM, UNCOMPLICATED, TYPE II (ICD-250.00) Not clear if getting good A1C readings from our lab today--send to [facility] to confirm Her updated medication list for this problem includes:    Metformin Hcl 500 Mg Tabs (Metformin hcl) .Marland Kitchen... 2 tabs by mouth two times a day with meals    Lisinopril 5 Mg Tabs (Lisinopril) .Marland Kitchen... 1 tab by mouth daily  Orders: T- Hemoglobin A1C (85462-70350) T-Urine Microalbumin w/creat. ratio [phone])  Problem # 5:  Social issues Discussed possibility of counseling here if she chooses  Complete Medication List: 1)  Metformin  Hcl 500 Mg Tabs (Metformin hcl) .... 2 tabs by mouth two times a day with meals 2)  Atenolol 25 Mg Tabs (Atenolol) .Marland Kitchen.. 1 tab by mouth daily 3)  Lisinopril 5 Mg Tabs (Lisinopril) .Marland Kitchen.. 1 tab by mouth daily 4)  Lanoxin 0.25 Mg Tabs (Digoxin) .Marland Kitchen.. 1 1/2 tabs by mouth daily 5)  Claritin 10 Mg Tabs (Loratadine) .... Take 1 tablet by mouth once a day as needed runnynose,itching,sneezing 6)  Lovaza 1 Gm Caps (Omega-3-acid ethyl esters) .... 4 caps by mouth daily 7)  Glenetta Borg W/device Kit (Blood glucose monitoring suppl) .... Daily testing of blood sugar 8)  Wavesense Presto Test Strp (Glucose blood) .... Daily sugar testing 9)  Miralax Powd (Polyethylene glycol 3350) .Marland KitchenMarland KitchenMarland Kitchen 17 g in 8 oz water by mouth daily 10)  Nu-iron 150 Mg Caps (Polysaccharide iron complex) .Marland Kitchen.. 1 tab by mouth daily  Other Orders: Influenza Vaccine NON MCR (60454)  Patient Instructions: 1)  Encourage flu vaccine when feeling better. 2)  Follow up with Dr. Delrae Alfred in 6 months --to check and see if we cover [employer]  then--will check cholesterol at next visit. 3)  Encourage you to restart Lovaza and Lisinopril Prescriptions: LOVAZA 1 GM CAPS (OMEGA-3-ACID ETHYL ESTERS) 4 caps by mouth daily  #120 x 6   Entered and Authorized by:   Julieanne Manson MD   Signed by:   Julieanne Manson MD on 03/31/2010   Method used:   Electronically to        [facility] [phone]* (retail)       [address]       [place], [place]  [address]       Ph: [phone]       Fax: [phone]   RxID:   [id number] MIRALAX  POWD (POLYETHYLENE GLYCOL 3350) 17 g in 8 oz water by mouth daily  #1 month x 6   Entered and Authorized by:   Julieanne Manson MD   Signed by:   Julieanne Manson MD on 03/31/2010   Method used:   Electronically to        [facility] [phone] (retail)       [address]       [place], [place]  [address]       Ph: [phone]       Fax: [phone]   RxID:   [id number] LANOXIN 0.25 MG  TABS (DIGOXIN) 1 1/2 tabs by mouth daily  #45 Each x 6   Entered and Authorized by:   Julieanne Manson MD   Signed by:   Julieanne Manson MD on 03/31/2010   Method used:   Electronically to        [facility] [phone]* (retail)       [address]       [place], [place]  [address]       Ph: [phone]       Fax: [phone]   RxID:   [id number] LISINOPRIL 5 MG  TABS (LISINOPRIL) 1 tab by mouth daily  #30 x 6   Entered and Authorized by:   Julieanne Manson MD   Signed by:   Julieanne Manson MD on 03/31/2010   Method used:   Electronically to        [facility] [phone]* (retail)       [address]       [place], [place]  [address]       Ph: [phone]  Fax: [phone]   RxID:   [id number] ATENOLOL 25 MG  TABS (ATENOLOL) 1 tab by mouth daily  #30 Each x 6   Entered and Authorized by:   Julieanne Manson MD   Signed by:   Julieanne Manson MD on 03/31/2010   Method used:   Electronically to        [facility] [phone]* (retail)       [address]       [place], [place]  [address]       Ph: [phone]       Fax: [phone]   RxID:   [id number] METFORMIN HCL 500 MG  TABS (METFORMIN HCL) 2 tabs by mouth two times a day with meals  #120 Each x 6   Entered and Authorized by:   Julieanne Manson MD   Signed by:   Julieanne Manson MD on 03/31/2010   Method used:   Electronically to        [facility] [phone]* (retail)       [address]       [place], [place]  [address]       Ph: [phone]       Fax: [phone]   RxID:   [id number]    Orders Added: 1)  T- Hemoglobin A1C [83036-23375] 2)  T-Comprehensive Metabolic Panel [80053-22900] 3)  T-CBC No Diff [[address]27-10000] 4)  T-TSH [37628-31517] 5)  T-Urine Microalbumin w/creat. ratio [82043-[address]] 6)  Est. Patient Level IV [61607] 7)  Influenza Vaccine NON MCR [00028]   Immunizations Administered:  Influenza Vaccine # 1:    Vaccine Type: Fluvax Non-MCR    Site: left deltoid    Mfr: GlaxoSmithKline    Dose: 0.5 ml    Route: IM    Given by: Michelle Nasuti    Exp. Date: 12/02/2010    Lot #: [id number]    VIS given: 01/03/10 version given March 31, 2010.  Flu Vaccine Consent Questions:    Do you have a history of severe allergic reactions to this vaccine? no    Any prior history of allergic reactions to egg and/or gelatin? no    Do you have a sensitivity to the preservative Thimersol? no    Do you have a past  history of Guillan-Barre Syndrome? no    Do you currently have an acute febrile illness? no    Have you ever had a severe reaction to latex? no    Vaccine information given and explained to patient? yes    Are you currently pregnant? no   Immunizations Administered:  Influenza Vaccine # 1:    Vaccine Type: Fluvax Non-MCR    Site: left deltoid    Mfr: GlaxoSmithKline    Dose: 0.5 ml    Route: IM    Given by: Michelle Nasuti    Exp. Date: 12/02/2010    Lot #: [id number]    VIS given: 01/03/10 version given March 31, 2010.     Laboratory Results   Urine Tests  Date/Time Received: March 31, 2010 Date/Time Reported: 4:59 PM  Routine Urinalysis   Color: lt. yellow Appearance: Clear Glucose: negative   (Normal Range: Negative) Bilirubin: negative   (Normal Range: Negative) Ketone: trace (5)   (Normal Range: Negative) Spec. Gravity: 1.025   (Normal Range: 1.003-1.035) Blood: negative   (Normal Range: Negative) pH: 6.0   (Normal Range: 5.0-8.0) Protein: negative   (Normal Range: Negative) Urobilinogen: 0.2   (Normal Range: 0-1) Nitrite: negative   (Normal Range: Negative)  Leukocyte Esterace: trace   (Normal Range: Negative)     Blood Tests     CBG Random:: 121

## 2010-07-11 NOTE — Progress Notes (Signed)
Summary: REACTION TO MEDICATION   Phone Note Call from Patient Call back at Home Phone [phone]   Reason for Call: Talk to Nurse Summary of Call: Breanna Berger PT. MS [name] IS CALLING ANDSAYING THAT A  MEDICINE THAT DR Rubbie Goostree PRESCRIBED, IS CAUSINIG HER TO HAVE A REACTION (RASH) TO THE SCOND MEDICATION THAT WAS GIVEN.  AND SHE WANTS TO KNOW WHAT TO DO. OR SHOULD SHE [facility] THE ER[facility] Initial call taken by: Leodis Rains,  December 09, 2009 4:56 PM  Follow-up for Phone Call        Spoke with pt and she is doing better, the itching and rash are almost completely gone. Follow-up by: Vesta Mixer CMA,  December 15, 2009 4:56 PM

## 2010-07-11 NOTE — Assessment & Plan Note (Signed)
Summary: LAST SEEN 12/???///KT   Vital Signs:  Patient profile:   50 year old female Menstrual status:  regular Weight:      254 pounds BMI:     38.20 Temp:     98.3 degrees F Pulse rate:   68 / minute Pulse rhythm:   regular Resp:     18 per minute BP sitting:   104 / 70  (left arm) Cuff size:   large  Vitals Entered By: Vesta Mixer CMA (November 25, 2009 12:43 PM) CC: f/u dm/htn   Pt is fasting Is Patient Diabetic? Yes Pain Assessment Patient in pain? yes     Location: back Intensity: 6 CBG Result 125  Does patient need assistance? Ambulation Normal   Primary Care Provider:  Julieanne Manson MD  CC:  f/u dm/htn   Pt is fasting.  History of Present Illness: 1.  Pt. has questions about labs done last fall/winter.  States she never heard anything, but documented that she was told.  Has not followed up as asked however.  2.  Anemia:  still need stool cards done--pt. given those again today.  Is taking iron once daily regularly.  Still with heavy periods.  3.  Hyperlipidemia:  for some reason, cholesterol not done with last labs, though ordered.  Pt. has not had redrawn as asked-not sure why this was not scheduled.  She is fasting today.  4.  Elevated WBC:  has been so for at least past 2 years--just needs routine follow up.  5.  Low back pain:  Problem for past week.  No injury that she recalls.  Has not had before that she recalls.  Has had some urinary frequency.  No dysuria.  No fever.  No hematuria.  Trying to drink more fluids.  Is having some vaginal discharge.  No odor and white. Pt. states could be her usual vaginal lubrication.  Having discomfort actually all the way around her lower abdomen to the low back where she is complaining of pain.  Discomfort without change when eats, urinates, defecates.  Stools are harder and having a BM every couple of days.  Some discomfort trying to defecate--but not the same discomfort as above.  6.  Resumed smoking secondary to  stressors about 1 month ago.  Smoking 1/2 ppd.  Would like some help with quitting again as she cannot get started.  Feeling a little overwhelmed, but not really depressed.   7.  DM:  Sugars running in 150-160 range.  "Trying to work on diet and exercise."    Allergies (verified): No Known Drug Allergies  Physical Exam  General:  NAD Lungs:  Normal respiratory effort, chest expands symmetrically. Lungs are clear to auscultation, no crackles or wheezes. Heart:  Normal rate and regular rhythm. S1 and S2 normal without gallop, murmur, click, rub or other extra sounds. Abdomen:  No flank tenderness, some LLQ tenderness and suprapubic tendernesssoft, normal bowel sounds, no masses, no guarding, no rigidity, no rebound tenderness, no hepatomegaly, and no splenomegaly.     Impression & Recommendations:  Problem # 1:  PELVIC  PAIN (ICD-789.09) Suspect related to problem #2 Orders: T-GC Probe, urine [id number]) T-Chlamydia  Probe, urine [phone]) KOH/ WET Mount [phone]) UA Dipstick w/o Micro (manual) (06269):  normal  Problem # 2:  CONSTIPATION (ICD-564.00) To increase fluid intake Her updated medication list for this problem includes:    Miralax Powd (Polyethylene glycol 3350) .Marland KitchenMarland KitchenMarland KitchenMarland Kitchen 17 g in 8 oz water by mouth daily  Problem #  3:  SEXUAL ACTIVITY, HIGH RISK (ICD-V69.2) Pt. admits at end of exam that had intercourse with someone and condom broke--worried about STDs Hx of BTL Orders: T-HIV Antibody  (Reflex) [id number]) T-Syphilis Test (RPR) [id number]) T-GC Probe, urine [id number]) T-Chlamydia  Probe, urine [id number]) KOH/ WET Mount [phone])  Problem # 4:  LEUKOCYTOSIS, CHRONIC (ICD-288.60)  Orders: T-CBC w/Diff ([id number])  Problem # 5:  ANEMIA, IRON DEFICIENCY, MICROCYTIC (ICD-280.8)  Orders: T-CBC w/Diff ([id number])  Problem # 6:  HYPERLIPIDEMIA, MIXED (ICD-272.2)  Her updated medication list for this problem includes:    Lovaza 1 Gm Caps (Omega-3-acid  ethyl esters) .Marland KitchenMarland KitchenMarland KitchenMarland Kitchen 4 caps by mouth daily  Orders: T-Lipid Profile ([id number])  Problem # 7:  TOBACCO ABUSE (ICD-305.1) Start Wellbutrin.  Complete Medication List: 1)  Metformin Hcl 500 Mg Tabs (Metformin hcl) .... 2 tabs by mouth two times a day with meals 2)  Atenolol 25 Mg Tabs (Atenolol) .Marland Kitchen.. 1 tab by mouth daily 3)  Lisinopril 5 Mg Tabs (Lisinopril) .Marland Kitchen.. 1 tab by mouth daily 4)  Lanoxin 0.25 Mg Tabs (Digoxin) .Marland Kitchen.. 1 1/2 tabs by mouth daily 5)  Claritin 10 Mg Tabs (Loratadine) .... Take 1 tablet by mouth once a day as needed runnynose,itching,sneezing 6)  Lovaza 1 Gm Caps (Omega-3-acid ethyl esters) .... 4 caps by mouth daily 7)  Glucometer  .... Once daily testing 8)  Glucometer Test Strips  .... Once daily glucose testing 9)  Metronidazole 500 Mg Tabs (Metronidazole) .Marland Kitchen.. 1 tab by mouth two times a day for 7 days 10)  Miralax Powd (Polyethylene glycol 3350) .Marland KitchenMarland KitchenMarland Kitchen 17 g in 8 oz water by mouth daily 11)  Wellbutrin Sr 150 Mg Xr12h-tab (Bupropion hcl) .Marland Kitchen.. 1 tab by mouth daily for 3 days, then increase to 1 tab two times a day.  quit smoking in second week of taking wellbutrin  Other Orders: Capillary Blood Glucose/CBG (82948) Hgb A1C (43329JJ)  Patient Instructions: 1)  Follow up with Dr. Delrae Alfred in 6 weeks for smoking cessation, constipation Prescriptions: WELLBUTRIN SR 150 MG XR12H-TAB (BUPROPION HCL) 1 tab by mouth daily for 3 days, then increase to 1 tab two times a day.  Quit smoking in second week of taking Wellbutrin  #60 x 2   Entered and Authorized by:   Julieanne Manson MD   Signed by:   Julieanne Manson MD on 11/25/2009   Method used:   Faxed to ...       [facility] (retail)       [address] [place], [place]  [address]       Ph: [phone]       Fax: [phone]   RxID:   [id number] MIRALAX  POWD (POLYETHYLENE GLYCOL 3350) 17 g in 8 oz water by mouth daily  #1 month x 11   Entered and Authorized by:    Julieanne Manson MD   Signed by:   Julieanne Manson MD on 11/25/2009   Method used:   Faxed to ...       [facility] - [facility] (retail)       [address] [place], [place]  [address]       Ph: [phone]       Fax: [phone]   RxID:   [id number]   Laboratory Results   Urine Tests    Routine Urinalysis   Glucose: negative   (Normal Range: Negative) Bilirubin: negative   (Normal  Range: Negative) Ketone: negative   (Normal Range: Negative) Spec. Gravity: >=1.030   (Normal Range: 1.003-1.035) Blood: negative   (Normal Range: Negative) pH: 5.0   (Normal Range: 5.0-8.0) Protein: negative   (Normal Range: Negative) Urobilinogen: negative   (Normal Range: 0-1) Nitrite: negative   (Normal Range: Negative) Leukocyte Esterace: negative   (Normal Range: Negative)     Blood Tests     CBG Random:: 125    Wet Mount/KOH Source: self swab WBC/hpf: 1-5 Bacteria/hpf: 1+ Clue cells/hpf: none  Negative whiff Yeast/hpf: none Trichomonas/hpf: none  Other Tests  Rapid HIV: negative

## 2010-07-12 ENCOUNTER — Encounter: Payer: Self-pay | Admitting: Internal Medicine

## 2010-08-04 ENCOUNTER — Telehealth (INDEPENDENT_AMBULATORY_CARE_PROVIDER_SITE_OTHER): Payer: Self-pay | Admitting: Internal Medicine

## 2010-08-08 ENCOUNTER — Telehealth (INDEPENDENT_AMBULATORY_CARE_PROVIDER_SITE_OTHER): Payer: Self-pay | Admitting: Internal Medicine

## 2010-08-08 NOTE — Progress Notes (Signed)
Summary: NEEDS TEST STRIPS  Phone Note Call from Patient Call back at Home Phone [phone]   Summary of Call: Briany Aye PT. MS [employer] SAYS SHE NEEDS A REX SENT TO [facility] ON [address] FOR WAVE SENSE PRESTO TEST STRIPS. SHE'S NEVER HAD A RX BECAUSE THE BOX CAME WITH SOME. Initial call taken by: Leodis Rains,  August 04, 2010 12:35 PM  Follow-up for Phone Call        Pt. no longer has an orange card -- [facility] [address] to call [facility] to have strips refills transferred.  Pt. aware to call [facility] pharmacy later today.  Dutch Quint RN  August 04, 2010 3:50 PM

## 2010-08-21 LAB — CBC
HCT: 39.5 % (ref 36.0–46.0)
MCH: 23.1 pg — ABNORMAL LOW (ref 26.0–34.0)
MCHC: 31.9 g/dL (ref 30.0–36.0)
RDW: 16.2 % — ABNORMAL HIGH (ref 11.5–15.5)

## 2010-08-21 LAB — COMPREHENSIVE METABOLIC PANEL
AST: 15 U/L (ref 0–37)
Albumin: 3.3 g/dL — ABNORMAL LOW (ref 3.5–5.2)
Alkaline Phosphatase: 77 U/L (ref 39–117)
BUN: 9 mg/dL (ref 6–23)
Calcium: 9.2 mg/dL (ref 8.4–10.5)
Creatinine, Ser: 0.7 mg/dL (ref 0.4–1.2)
Glucose, Bld: 159 mg/dL — ABNORMAL HIGH (ref 70–99)
Sodium: 135 mEq/L (ref 135–145)
Total Bilirubin: 0.3 mg/dL (ref 0.3–1.2)
Total Protein: 6.9 g/dL (ref 6.0–8.3)

## 2010-08-21 LAB — DIFFERENTIAL
Basophils Absolute: 0 10*3/uL (ref 0.0–0.1)
Eosinophils Absolute: 0.4 10*3/uL (ref 0.0–0.7)
Eosinophils Relative: 3 % (ref 0–5)
Lymphocytes Relative: 30 % (ref 12–46)
Neutrophils Relative %: 62 % (ref 43–77)

## 2010-08-21 LAB — URINALYSIS, ROUTINE W REFLEX MICROSCOPIC
Bilirubin Urine: NEGATIVE
Hgb urine dipstick: NEGATIVE
Protein, ur: NEGATIVE mg/dL
Urobilinogen, UA: 0.2 mg/dL (ref 0.0–1.0)

## 2010-08-21 LAB — URINE CULTURE: Colony Count: 25000

## 2010-08-21 LAB — LIPASE, BLOOD: Lipase: 25 U/L (ref 11–59)

## 2010-08-22 NOTE — Progress Notes (Signed)
Summary: GLUCOSE METER REQUEST   Phone Note Call from Patient   Caller: Patient Summary of Call: PT CALLED AND SHE SAID THAT SHE HAS A PRIVATE INSURANCE NOW SHE NEEDS A METER ACCUCHECK COMPACT PLUS AND STRIPS AND PT USES [facility] [address] . PLEASE CALL PT [phone] THANK YOU   PT CALLED BK AGAIN TODAY BECAUSE SHE HASN'T HEARD ANYTHING BK AND WANTED TO STRESS THAT SHE HASN'T TESTED IN SEVERAL DAYS AND WANTED TO STRESS THAT SHE'S HAVING PROBLEMS WITH HER DM Initial call taken by: Cheryll Dessert,  August 08, 2010 10:45 AM  Follow-up for Phone Call        States couldn't get the strips for the wavesense.  Needs new Rx for accucheck compact strips -- already bought the meter.  [facility] [address]  Needs enough strips to check CBG three times per day. Follow-up by: Dutch Quint RN,  August 10, 2010 5:44 PM  Additional Follow-up for Phone Call Additional follow up Details #1::        Insurance won't cover three times a day check as she was controlled last time we checked an A1C.  They will generally only cover once daily testing when only taking oral meds. Does she have Medicaid or another insurance? Additional Follow-up by: Julieanne Manson MD,  August 10, 2010 6:12 PM    Additional Follow-up for Phone Call Additional follow up Details #2::    Left message on answering machine for pt. to return call.  Dutch Quint RN  August 14, 2010 2:35 PM  Already picked up testing supplies and monitor.  Advised pt. of provider's response - verbalized understanding and agreement.  Has [facility]. -- will have to find another provider since we don't accept [facility] Medicare.  Dutch Quint RN  August 15, 2010 12:58 PM   New/Updated Medications: ACCU-CHEK COMPACT PLUS CARE  KIT (BLOOD GLUCOSE MONITORING SUPPL) Twice daily blood sugar checks.  250.00 ACCU-CHEK COMPACT  STRP (GLUCOSE BLOOD) Bid blood sugar checks 250.00 Prescriptions: ACCU-CHEK COMPACT  STRP (GLUCOSE  BLOOD) Bid blood sugar checks 250.00  #45 x 11   Entered and Authorized by:   Julieanne Manson MD   Signed by:   Julieanne Manson MD on 08/10/2010   Method used:   [facility] [address] [phone][facility] (retail)       [address]       [place], [place]  [id number]       Ph: [phone] or [phone]       Fax: [phone]   RxID:   [id number] ACCU-CHEK COMPACT PLUS CARE  KIT (BLOOD GLUCOSE MONITORING SUPPL) Twice daily blood sugar checks.  250.00  #1 x 0   Entered and Authorized by:   Julieanne Manson MD   Signed by:   Julieanne Manson MD on 08/10/2010   Method used:   [facility]        [facility] [address] [phone][facility] (retail)       [address]       [place], [place]  [id number]       Ph: [phone] or [phone]       Fax: [phone]   RxID:   [id number]

## 2010-08-27 LAB — URINALYSIS, ROUTINE W REFLEX MICROSCOPIC
Bilirubin Urine: NEGATIVE
Ketones, ur: NEGATIVE mg/dL
Nitrite: NEGATIVE
Protein, ur: NEGATIVE mg/dL
Urobilinogen, UA: 0.2 mg/dL (ref 0.0–1.0)

## 2010-08-27 LAB — CBC
Hemoglobin: 11 g/dL — ABNORMAL LOW (ref 12.0–15.0)
MCH: 24.5 pg — ABNORMAL LOW (ref 26.0–34.0)
MCV: 75.1 fL — ABNORMAL LOW (ref 78.0–100.0)
RBC: 4.51 MIL/uL (ref 3.87–5.11)

## 2010-08-27 LAB — DIFFERENTIAL
Eosinophils Absolute: 0.3 10*3/uL (ref 0.0–0.7)
Lymphs Abs: 3.5 10*3/uL (ref 0.7–4.0)
Monocytes Relative: 5 % (ref 3–12)
Neutrophils Relative %: 65 % (ref 43–77)

## 2010-08-27 LAB — POCT I-STAT, CHEM 8
Creatinine, Ser: 0.8 mg/dL (ref 0.4–1.2)
Glucose, Bld: 135 mg/dL — ABNORMAL HIGH (ref 70–99)
Hemoglobin: 12.2 g/dL (ref 12.0–15.0)
Potassium: 4.6 mEq/L (ref 3.5–5.1)

## 2010-09-03 LAB — POCT I-STAT, CHEM 8
BUN: 7 mg/dL (ref 6–23)
Calcium, Ion: 1.14 mmol/L (ref 1.12–1.32)
Chloride: 105 mEq/L (ref 96–112)
Creatinine, Ser: 0.6 mg/dL (ref 0.4–1.2)
Glucose, Bld: 160 mg/dL — ABNORMAL HIGH (ref 70–99)
HCT: 43 % (ref 36.0–46.0)

## 2010-09-03 LAB — POCT CARDIAC MARKERS: Troponin i, poc: <0.05 ng/mL (ref 0.00–0.09)

## 2010-09-17 LAB — URINALYSIS, ROUTINE W REFLEX MICROSCOPIC
Nitrite: NEGATIVE
Specific Gravity, Urine: >1.03 — ABNORMAL HIGH (ref 1.005–1.030)
pH: 6 (ref 5.0–8.0)

## 2010-09-17 LAB — POCT PREGNANCY, URINE
Preg Test, Ur: NEGATIVE
Preg Test, Ur: NEGATIVE

## 2010-09-17 LAB — WET PREP, GENITAL: Yeast Wet Prep HPF POC: NONE SEEN

## 2010-12-15 ENCOUNTER — Emergency Department (HOSPITAL_COMMUNITY)
Admission: EM | Admit: 2010-12-15 | Discharge: 2010-12-16 | Disposition: A | Discharge status: Home / Self Care | Payer: No Typology Code available for payment source | Attending: Emergency Medicine | Admitting: Emergency Medicine

## 2010-12-15 DIAGNOSIS — M542 Cervicalgia: Secondary | ICD-10-CM | POA: Insufficient documentation

## 2010-12-15 DIAGNOSIS — E119 Type 2 diabetes mellitus without complications: Secondary | ICD-10-CM | POA: Insufficient documentation

## 2010-12-15 DIAGNOSIS — M549 Dorsalgia, unspecified: Secondary | ICD-10-CM | POA: Insufficient documentation

## 2010-12-15 DIAGNOSIS — I059 Rheumatic mitral valve disease, unspecified: Secondary | ICD-10-CM | POA: Insufficient documentation

## 2010-12-27 ENCOUNTER — Emergency Department (HOSPITAL_COMMUNITY): Payer: 59

## 2010-12-27 ENCOUNTER — Emergency Department (HOSPITAL_COMMUNITY)
Admission: EM | Admit: 2010-12-27 | Discharge: 2010-12-27 | Disposition: A | Discharge status: Home / Self Care | Payer: 59 | Attending: Emergency Medicine | Admitting: Emergency Medicine

## 2010-12-27 DIAGNOSIS — E119 Type 2 diabetes mellitus without complications: Secondary | ICD-10-CM | POA: Insufficient documentation

## 2010-12-27 DIAGNOSIS — R002 Palpitations: Secondary | ICD-10-CM | POA: Insufficient documentation

## 2010-12-27 DIAGNOSIS — R0609 Other forms of dyspnea: Secondary | ICD-10-CM | POA: Insufficient documentation

## 2010-12-27 DIAGNOSIS — R0989 Other specified symptoms and signs involving the circulatory and respiratory systems: Secondary | ICD-10-CM | POA: Insufficient documentation

## 2010-12-27 DIAGNOSIS — R05 Cough: Secondary | ICD-10-CM | POA: Insufficient documentation

## 2010-12-27 DIAGNOSIS — R0602 Shortness of breath: Secondary | ICD-10-CM | POA: Insufficient documentation

## 2010-12-27 DIAGNOSIS — R059 Cough, unspecified: Secondary | ICD-10-CM | POA: Insufficient documentation

## 2010-12-27 DIAGNOSIS — Z79899 Other long term (current) drug therapy: Secondary | ICD-10-CM | POA: Insufficient documentation

## 2010-12-27 DIAGNOSIS — K5900 Constipation, unspecified: Secondary | ICD-10-CM | POA: Insufficient documentation

## 2010-12-27 LAB — CBC
Hemoglobin: 11.8 g/dL — ABNORMAL LOW (ref 12.0–15.0)
MCH: 23 pg — ABNORMAL LOW (ref 26.0–34.0)
MCV: 71.7 fL — ABNORMAL LOW (ref 78.0–100.0)
RBC: 5.13 MIL/uL — ABNORMAL HIGH (ref 3.87–5.11)
WBC: 11.1 10*3/uL — ABNORMAL HIGH (ref 4.0–10.5)

## 2010-12-27 LAB — TROPONIN I: Troponin I: <0.3 ng/mL (ref <0.30)

## 2010-12-27 LAB — BASIC METABOLIC PANEL
CO2: 26 mEq/L (ref 19–32)
Calcium: 9 mg/dL (ref 8.4–10.5)
Chloride: 104 mEq/L (ref 96–112)
GFR calc Af Amer: >60 mL/min (ref >60)
Sodium: 137 mEq/L (ref 135–145)

## 2010-12-27 LAB — CK TOTAL AND CKMB (NOT AT ARMC): Relative Index: 1.1 (ref 0.0–2.5)

## 2011-07-09 ENCOUNTER — Other Ambulatory Visit: Payer: Self-pay | Admitting: Cardiology

## 2011-11-16 ENCOUNTER — Emergency Department (INDEPENDENT_AMBULATORY_CARE_PROVIDER_SITE_OTHER)
Admission: EM | Admit: 2011-11-16 | Discharge: 2011-11-16 | Disposition: A | Discharge status: Home / Self Care | Payer: Self-pay | Source: Home / Self Care | Attending: Emergency Medicine | Admitting: Emergency Medicine

## 2011-11-16 ENCOUNTER — Encounter (HOSPITAL_COMMUNITY): Payer: Self-pay | Admitting: *Deleted

## 2011-11-16 DIAGNOSIS — IMO0002 Reserved for concepts with insufficient information to code with codable children: Secondary | ICD-10-CM

## 2011-11-16 DIAGNOSIS — L0231 Cutaneous abscess of buttock: Secondary | ICD-10-CM

## 2011-11-16 DIAGNOSIS — R739 Hyperglycemia, unspecified: Secondary | ICD-10-CM

## 2011-11-16 DIAGNOSIS — R7309 Other abnormal glucose: Secondary | ICD-10-CM

## 2011-11-16 DIAGNOSIS — L03112 Cellulitis of left axilla: Secondary | ICD-10-CM

## 2011-11-16 HISTORY — DX: Nonrheumatic mitral (valve) prolapse: I34.1

## 2011-11-16 LAB — GLUCOSE, CAPILLARY: Glucose-Capillary: 271 mg/dL — ABNORMAL HIGH (ref 70–99)

## 2011-11-16 MED ORDER — HYDROCODONE-ACETAMINOPHEN 5-325 MG PO TABS: 2.0000 | ORAL_TABLET | ORAL | Status: AC | PRN

## 2011-11-16 MED ORDER — BACITRACIN 500 UNIT/GM EX OINT
1.0000 "application " | TOPICAL_OINTMENT | Freq: Once | CUTANEOUS | Status: AC
  Administered 2011-11-16: 1 via TOPICAL

## 2011-11-16 MED ORDER — IBUPROFEN 600 MG PO TABS: 600.0000 mg | ORAL_TABLET | Freq: Four times a day (QID) | ORAL | Status: AC | PRN

## 2011-11-16 MED ORDER — SULFAMETHOXAZOLE-TRIMETHOPRIM 800-160 MG PO TABS: 1.0000 | ORAL_TABLET | Freq: Two times a day (BID) | ORAL | Status: AC

## 2011-11-16 MED ORDER — LIDOCAINE-EPINEPHRINE 2 %-1:100000 IJ SOLN
5.0000 mL | Freq: Once | INTRAMUSCULAR | Status: AC
  Administered 2011-11-16: 5 mL

## 2011-11-16 NOTE — Discharge Instructions (Signed)
Warm compresses 4x/day. Keep your blood sugar under good control. Return here or follow up with your doctor in 2 days for a wound check. Return sooner if you get worse, have a fever >100.4, or any other concerns. Take the medication as written. Take 1 gram of tylenol with the motrin up to 4 times a day as needed for pain and fever. This is an effective combination for pain. Take the hydrocodone/norco only for severe pain. Do not take the tylenol and hydrocodone/norco as they both have tylenol in them and too much can hurt your liver.

## 2011-11-16 NOTE — ED Provider Notes (Signed)
History     CSN: [id number]  Arrival date & time 11/16/11  8295   First MD Initiated Contact with Patient 11/16/11 1912      Chief Complaint  Patient presents with  . Recurrent Skin Infections    (Consider location/radiation/quality/duration/timing/severity/associated sxs/prior treatment) HPI Comments: Patient reports painful mass gradually increasing size over her right buttock starting several days ago. Doesn't recall any trauma to the area. Reports no drainage. Pain is worse with palpation. She has been using rubbing alcohol on it without relief. No other alleviating factors. No nausea, vomiting, fevers. Patient is a diabetic, and She states that her sugars have been running higher than usual, in the 200s. They're normally below 200.  patient also noted a painful area posterior to her left axilla starting earlier today.  ROS as noted in HPI. All other ROS negative.   Patient is a 51 y.o. female presenting with abscess. The history is provided by the patient. No language interpreter was used.  Abscess  This is a new problem. The current episode started less than one week ago. The onset was gradual. The problem has been gradually worsening. The abscess is present on the right buttock. The abscess is characterized by redness and painfulness. It is unknown what she was exposed to. The abscess first occurred at home. Pertinent negatives include no fever and no vomiting. There were no sick contacts.    Past Medical History  Diagnosis Date  . Diabetes mellitus   . Hypertension   . MVP (mitral valve prolapse)     Past Surgical History  Procedure Date  . Hernia repair   . Cholecystectomy   . Cesarean section     History reviewed. No pertinent family history.  History  Substance Use Topics  . Smoking status: Current Everyday Smoker  . Smokeless tobacco: Not on file  . Alcohol Use: No    OB History    Grav Para Term Preterm Abortions TAB SAB Ect Mult Living                   Review of Systems  Constitutional: Negative for fever.  Gastrointestinal: Negative for vomiting.    Allergies  Doxycycline hyclate  Home Medications   Current Outpatient Rx  Name Route Sig Dispense Refill  . ATENOLOL 25 MG PO TABS Oral Take 25 mg by mouth daily.    Marland Kitchen DIGOXIN 0.25 MG PO TABS Oral Take 250 mcg by mouth daily.    Marland Kitchen METFORMIN HCL 500 MG PO TABS Oral Take 500 mg by mouth 2 (two) times daily with a meal.    . HYDROCODONE-ACETAMINOPHEN 5-325 MG PO TABS Oral Take 2 tablets by mouth every 4 (four) hours as needed for pain. 20 tablet 0  . IBUPROFEN 600 MG PO TABS Oral Take 1 tablet (600 mg total) by mouth every 6 (six) hours as needed for pain. 30 tablet 0  . SULFAMETHOXAZOLE-TRIMETHOPRIM 800-160 MG PO TABS Oral Take 1 tablet by mouth 2 (two) times daily. X 10 days 20 tablet 0    BP 141/70  Pulse 74  Temp(Src) 99.1 F (37.3 C) (Oral)  Resp 18  SpO2 100%  Physical Exam  Nursing note and vitals reviewed. Constitutional: She is oriented to person, place, and time. She appears well-developed and well-nourished. No distress.  HENT:  Head: Normocephalic and atraumatic.  Eyes: Conjunctivae and EOM are normal.  Neck: Normal range of motion.  Cardiovascular: Normal rate.   Pulmonary/Chest: Effort normal.  Abdominal: She exhibits no  distension.  Musculoskeletal: Normal range of motion.       Legs:      3 x 3.5 cm area of erythema and induration with small amount of central fluctuance. 1 x 1.5 cm area of induration posterior to left axilla. No central fluctuance. Marked both these sites with a permanent marker for reference.  Neurological: She is alert and oriented to person, place, and time.  Skin: Skin is warm and dry.  Psychiatric: She has a normal mood and affect. Her behavior is normal. Judgment and thought content normal.    ED Course  INCISION AND DRAINAGE Date/Time: 11/16/2011 8:42 PM Performed by: Luiz Blare Authorized by: Luiz Blare Consent: Verbal consent obtained. Risks and benefits: risks, benefits and alternatives were discussed Consent given by: patient Patient understanding: patient states understanding of the procedure being performed Patient consent: the patient's understanding of the procedure matches consent given Site marked: the operative site was marked Required items: required blood products, implants, devices, and special equipment available Patient identity confirmed: verbally with patient Time out: Immediately prior to procedure a "time out" was called to verify the correct patient, procedure, equipment, support staff and site/side marked as required. Type: abscess Body area: lower extremity (right buttock) Anesthesia: local infiltration Local anesthetic: lidocaine 2% with epinephrine Anesthetic total: 3 ml Patient sedated: no Scalpel size: 11 Incision type: cruciate. Complexity: simple Drainage: purulent Drainage amount: scant Wound treatment: wound left open Patient tolerance: Patient tolerated the procedure well with no immediate complications. Comments: Marked area of induration for reference. Performed blunt dissection to break up loculations. Sending off for culture. Irrigated the wound with remainder of lidocaine with epi solution, 7 mL. Applied bacitracin, sterile pressure dressing.   (including critical care time)  Labs Reviewed  GLUCOSE, CAPILLARY - Abnormal; Notable for the following:    Glucose-Capillary 271 (*)    All other components within normal limits  CULTURE, ROUTINE-ABSCESS   No results found.   1. Abscess of buttock, right   2. Cellulitis of axilla, left   3. Hyperglycemia      MDM  There is nothing to I&D under her left axilla this time. Will have her start warm compresses. Marked area of induration for reference. She is returning in 2 days for a wound check, and we will I&D this if necessary at that time. Sending home with Bactrim, warm compresses, pain  medication. Emphasized importance of keeping her sugar under adequate control. Discussed signs and symptoms that should prompt return to the ED. Patient agrees with plan.  Luiz Blare, MD 11/16/11 2047

## 2011-11-16 NOTE — ED Notes (Signed)
Bacitracin  Ointment  Applied   dsd

## 2011-11-16 NOTE — ED Notes (Signed)
Pt  Has  Two  Boils  One  Is  Under l  Armpit  And  The  Other  Is  R   Upper  Thigh/buttock  Area       Which  She  Has  Had  For  4  Days      She  Is  A  Diabetic

## 2011-11-19 ENCOUNTER — Telehealth (HOSPITAL_COMMUNITY): Payer: Self-pay | Admitting: *Deleted

## 2011-11-19 LAB — CULTURE, ROUTINE-ABSCESS

## 2011-11-19 NOTE — ED Notes (Signed)
Wound culture positive for MRSA.  Pt adequately treated at visit with Septra-DS.  Pt notified and MRSA education performed.

## 2011-12-17 ENCOUNTER — Other Ambulatory Visit: Payer: Self-pay | Admitting: Cardiology

## 2012-01-04 ENCOUNTER — Encounter (HOSPITAL_COMMUNITY): Payer: Self-pay | Admitting: *Deleted

## 2012-01-04 ENCOUNTER — Emergency Department (INDEPENDENT_AMBULATORY_CARE_PROVIDER_SITE_OTHER)
Admission: EM | Admit: 2012-01-04 | Discharge: 2012-01-04 | Disposition: A | Discharge status: Home / Self Care | Payer: Self-pay | Source: Home / Self Care

## 2012-01-04 DIAGNOSIS — J019 Acute sinusitis, unspecified: Secondary | ICD-10-CM

## 2012-01-04 LAB — POCT RAPID STREP A: Streptococcus, Group A Screen (Direct): NEGATIVE

## 2012-01-04 MED ORDER — AMOXICILLIN 500 MG PO CAPS: 500.0000 mg | ORAL_CAPSULE | Freq: Two times a day (BID) | ORAL | Status: AC

## 2012-01-04 MED ORDER — FLUTICASONE PROPIONATE 50 MCG/ACT NA SUSP: 2.0000 | Freq: Every day | NASAL | Status: DC

## 2012-01-04 MED ORDER — CETIRIZINE HCL 10 MG PO TABS: 10.0000 mg | ORAL_TABLET | Freq: Every day | ORAL | Status: DC

## 2012-01-04 NOTE — ED Notes (Signed)
pT  REPORTS  SYMPTOMS  OF  BILATERAL  EARACHE  SORETHROAT  SWOLLEN  GLANDS  AS   WELL  AS  SOME  SINUS  CONGESTION  AND  NASAL  STUFFYNESS   SYMPTOMS  SX  1  WEEK           PT   REPORTS  THE  SYMPTOMS  ARE  WORSE  AT  NIGHT

## 2012-01-04 NOTE — ED Provider Notes (Signed)
Medical screening examination/treatment/procedure(s) were performed by non-physician practitioner and as supervising physician I was immediately available for consultation/collaboration.  Leslee Home, M.D.   Reuben Likes, MD 01/04/12 2142

## 2012-01-04 NOTE — ED Provider Notes (Signed)
History     CSN: [id number]  Arrival date & time 01/04/12  1628   First MD Initiated Contact with Patient 01/04/12 1734      Chief Complaint  Patient presents with  . Otalgia    HPI   History of Present Illness   Patient Identification Breanna Berger is a 51 y.o. female.  Patient information was obtained from patient.  Chief Complaint  Otalgia  The patient complains of nasal congestion, sore throat, dry cough, hoarseness, earache bilateral, sweats and chills. Onset of symptoms was gradual starting 6 days ago and has been gradually worsening since that time. The patient has not been seen by another provider for this illness, her PCP is Dr. Delrae Alfred. Other symptoms include plugged sensation right ear and nasal, facial, and chest congestion.  Nasal discharge is yellow-green in color.  Care prior to arrival consisted of Mucinex DM, with minimal relief.  Patient has not taken temperature at home, but says she feels chills at bedtime.  Symptoms are worse at night time.  She denies any nausea/vomiting or decreased appetite.    Past Medical History  Diagnosis Date  . Diabetes mellitus   . Hypertension   . MVP (mitral valve prolapse)    No family history on file. No current facility-administered medications for this encounter.   Current Outpatient Prescriptions  Medication Sig Dispense Refill  . amoxicillin (AMOXIL) 500 MG capsule Take 1 capsule (500 mg total) by mouth 2 (two) times daily.  20 capsule  0  . atenolol (TENORMIN) 25 MG tablet Take 25 mg by mouth daily.      . cetirizine (ZYRTEC) 10 MG tablet Take 1 tablet (10 mg total) by mouth daily.  30 tablet  0  . digoxin (LANOXIN) 0.25 MG tablet Take 250 mcg by mouth daily.      . fluticasone (FLONASE) 50 MCG/ACT nasal spray Place 2 sprays into the nose daily.  16 g  0  . metFORMIN (GLUCOPHAGE) 500 MG tablet Take 500 mg by mouth 2 (two) times daily with a meal.       Allergies  Allergen Reactions  . Doxycycline Hyclate    REACTION: rash   History   Social History  . Marital Status: Single    Spouse Name: N/A    Number of Children: N/A  . Years of Education: N/A   Occupational History  . Not on file.   Social History Main Topics  . Smoking status: Current Everyday Smoker  . Smokeless tobacco: Not on file  . Alcohol Use: No  . Drug Use: No  . Sexually Active:    Other Topics Concern  . Not on file   Social History Narrative  . No narrative on file   Review of Systems Pertinent items are noted in HPI.    Physical Exam   BP 128/72  Pulse 72  Temp 98.6 F (37 C) (Oral)  SpO2 100% Head: Normocephalic, without obvious abnormality, atraumatic Eyes: positive findings: conjunctiva: trace injection bilaterally Ears: abnormal TM right ear - erythematous and dull, no bulging or drainage; normal TM left ear Nose: Nares normal. Septum midline. Mucosa normal. No drainage or sinus tenderness. Throat: lips, mucosa, and tongue normal; teeth and gums normal Neck: no adenopathy and supple, symmetrical, trachea midline Lungs: clear to auscultation bilaterally Heart: regular rate and rhythm, S1, S2 normal, no murmur, click, rub or gallop Skin: Skin color, texture, turgor normal. No rashes or lesions  ED Course    Past Medical History  Diagnosis  Date  . Diabetes mellitus   . Hypertension   . MVP (mitral valve prolapse)     Past Surgical History  Procedure Date  . Hernia repair   . Cholecystectomy   . Cesarean section     No family history on file.  History  Substance Use Topics  . Smoking status: Current Everyday Smoker  . Smokeless tobacco: Not on file  . Alcohol Use: No    Review of Systems  Per HPI  Allergies  Doxycycline hyclate  Home Medications   Current Outpatient Rx  Name Route Sig Dispense Refill  . AMOXICILLIN 500 MG PO CAPS Oral Take 1 capsule (500 mg total) by mouth 2 (two) times daily. 20 capsule 0  . ATENOLOL 25 MG PO TABS Oral Take 25 mg by mouth daily.    Marland Kitchen  CETIRIZINE HCL 10 MG PO TABS Oral Take 1 tablet (10 mg total) by mouth daily. 30 tablet 0  . DIGOXIN 0.25 MG PO TABS Oral Take 250 mcg by mouth daily.    Marland Kitchen FLUTICASONE PROPIONATE 50 MCG/ACT NA SUSP Nasal Place 2 sprays into the nose daily. 16 g 0  . METFORMIN HCL 500 MG PO TABS Oral Take 500 mg by mouth 2 (two) times daily with a meal.      BP 128/72  Pulse 72  Temp 98.6 F (37 C) (Oral)  SpO2 100%  Physical Exam  See above.  ED Course  Procedures (including critical care time)   Labs Reviewed  POCT RAPID STREP A ([facility] C[facility] ONLY)  NEGATIVE  1. Sinusitis acute       MDM   Acute sinusitis: viral vs. Bacterial.  Rapid strep NEGATIVE.  Because symptoms are gradually worsening, will treat with Amoxicillin x 10 days.  Will treat symptoms with Flonase, Zyrtec, and Mucinex.  If symptoms do not improve in 7-10 days, advised patient to follow up with PCP.  Red flags reviewed per Discharge instructions.         Barnabas Lister, MD 01/04/12 [phone]

## 2012-01-07 ENCOUNTER — Telehealth (HOSPITAL_COMMUNITY): Payer: Self-pay | Admitting: *Deleted

## 2012-01-07 NOTE — ED Notes (Signed)
Pt. called and said she lost the Rx. bottles of Amoxicillin and Zyrtec. She left them in her daughters car in a bag and her daughter threw the bag away.  She had already taken 5 of the 20 pills.  She asked if I could call it to [facility]. I told her they won't fill our Rx.'s any longer but she can call an verify this and call me back. Discussed with Dr. Alfonse Ras- Coll.  She approved for me to call the Amoxicillin 500 mg. BID x 7 days and the Zyrtec as written.  Pt. called back on VM and said [facility] would not fill it and to call it to [facility] [address]  Rx.'s called to the pharmacist @ [phone]. I called pt. back and told her I got her message and had called the Rx.'s into [facility]. Vassie Moselle 01/07/2012

## 2012-03-26 ENCOUNTER — Emergency Department (INDEPENDENT_AMBULATORY_CARE_PROVIDER_SITE_OTHER)
Admission: EM | Admit: 2012-03-26 | Discharge: 2012-03-26 | Disposition: A | Discharge status: Home / Self Care | Payer: No Typology Code available for payment source | Source: Home / Self Care

## 2012-03-26 DIAGNOSIS — L0390 Cellulitis, unspecified: Secondary | ICD-10-CM

## 2012-03-26 DIAGNOSIS — L0291 Cutaneous abscess, unspecified: Secondary | ICD-10-CM

## 2012-03-26 DIAGNOSIS — E1165 Type 2 diabetes mellitus with hyperglycemia: Secondary | ICD-10-CM

## 2012-03-26 DIAGNOSIS — E119 Type 2 diabetes mellitus without complications: Secondary | ICD-10-CM

## 2012-03-26 LAB — GLUCOSE, CAPILLARY: Glucose-Capillary: 291 mg/dL — ABNORMAL HIGH (ref 70–99)

## 2012-03-26 MED ORDER — ATENOLOL 25 MG PO TABS: 25.0000 mg | ORAL_TABLET | Freq: Every day | ORAL | Status: DC

## 2012-03-26 MED ORDER — SULFAMETHOXAZOLE-TRIMETHOPRIM 800-160 MG PO TABS: 1.0000 | ORAL_TABLET | Freq: Two times a day (BID) | ORAL | Status: DC

## 2012-03-26 MED ORDER — DIGOXIN 250 MCG PO TABS: 0.2500 mg | ORAL_TABLET | Freq: Every day | ORAL | Status: DC

## 2012-03-26 MED ORDER — METFORMIN HCL 1000 MG PO TABS: 1000.0000 mg | ORAL_TABLET | Freq: Two times a day (BID) | ORAL | Status: DC

## 2012-03-26 NOTE — ED Provider Notes (Signed)
CC:  Check on diabetes, bumps on buttock. 1 week.  51 y.o. female with Type II Diabetes. [facility] patient with no primary care source at this time. Trying to stretch prescriptions due to lack of care/insurance/finances. Has not been taking metformin as prescribed, only once a day. Last A1C about 2 months ago before [facility] closed - around 9. Pt checks her sugars occasionally and states they run around 130-150. Does have a lot of thirst and frequent urination recently.   Developed 2 "bumpus" on left buttock about a week ago. Only mildly tender, no drainage, not growing, not itchy. Cannot see them so not sure if red/inflamed. Denies fever/chills. Does have hx MRSA skin infection.  ROS:  Intermittent stomach pain, constipation. No nausea/vomiting/diarrhea/dysuria. No current chest pain/pressure, dyspnea  MEDS:  Metformin 1000 mg BID, Digoxin 0.25 mg daily, Atenolol 25 mg daily.   Past Medical History  Diagnosis Date  . Diabetes mellitus   . Hypertension   . MVP (mitral valve prolapse)   Atrial fibrillation (pt not sure but hx of fast/irregular heart rate for which she has been hospitalized and for which she takes digoxin.  Past Surgical History  Procedure Date  . Hernia repair   . Cholecystectomy   . Cesarean section    History   Social History  . Marital Status: Single    Spouse Name: N/A    Number of Children: N/A  . Years of Education: N/A   Occupational History  . Not on file.   Social History Main Topics  . Smoking status: Current Every Day Smoker  . Smokeless tobacco: Not on file  . Alcohol Use: No  . Drug Use: No  . Sexually Active:    Other Topics Concern  . Not on file   Social History Narrative  . No narrative on file   Family HX:  No-contributory  Filed Vitals:   03/26/12 2019  BP: 120/80  Pulse: 69  Temp: 99.7 F (37.6 C)  Resp: 17   GEN:  Obese, no distress HEENT:  NCAT, EOMI CV/RESP:  RRR, no murmur, CTAB SKIN:  0.5 cm erythematous  papule on left buttock, no drainage, non-fluctuant. Second nodule, 1 cm, indurated, non-fluctuant, red in center. Mildly tender.  POCT Blood Sugar;  291  A/P 51 y.o. obese female with 1.  Diabetes - poorly controlled. Refill metformin. Encouraged adherence to diet, exercise, blood sugar checks. Handouts given. Needs to establish care with new PCP. 2.  Skin abscesses - nothing to I&D. Will give 7 days Bactrim. Return to hospital for fever/chills/drainage or increase in size of nodules. 3.  HTN;  Controlled 4.  A Fib or other arrythmia:  Continue/refill digoxin, atenolol.  Napoleon Form, MD   Napoleon Form, MD 03/26/12 [phone]

## 2012-05-03 ENCOUNTER — Other Ambulatory Visit (HOSPITAL_COMMUNITY): Payer: Self-pay | Admitting: Family Medicine

## 2012-05-05 ENCOUNTER — Emergency Department (INDEPENDENT_AMBULATORY_CARE_PROVIDER_SITE_OTHER)
Admission: EM | Admit: 2012-05-05 | Discharge: 2012-05-05 | Disposition: A | Discharge status: Home / Self Care | Payer: No Typology Code available for payment source | Source: Home / Self Care

## 2012-05-05 ENCOUNTER — Encounter (HOSPITAL_COMMUNITY): Payer: Self-pay

## 2012-05-05 DIAGNOSIS — E119 Type 2 diabetes mellitus without complications: Secondary | ICD-10-CM

## 2012-05-05 DIAGNOSIS — E782 Mixed hyperlipidemia: Secondary | ICD-10-CM

## 2012-05-05 DIAGNOSIS — F17200 Nicotine dependence, unspecified, uncomplicated: Secondary | ICD-10-CM

## 2012-05-05 DIAGNOSIS — J301 Allergic rhinitis due to pollen: Secondary | ICD-10-CM

## 2012-05-05 LAB — COMPREHENSIVE METABOLIC PANEL
BUN: 8 mg/dL (ref 6–23)
Calcium: 9.5 mg/dL (ref 8.4–10.5)
GFR calc Af Amer: >90 mL/min (ref >90)
Glucose, Bld: 330 mg/dL — ABNORMAL HIGH (ref 70–99)
Total Protein: 7.2 g/dL (ref 6.0–8.3)

## 2012-05-05 MED ORDER — METFORMIN HCL 1000 MG PO TABS: 1000.0000 mg | ORAL_TABLET | Freq: Two times a day (BID) | ORAL | Status: DC

## 2012-05-05 MED ORDER — CETIRIZINE HCL 10 MG PO TABS: 10.0000 mg | ORAL_TABLET | Freq: Every day | ORAL | Status: DC

## 2012-05-05 NOTE — ED Provider Notes (Signed)
History    CSN: [id number]  Arrival date & time 05/05/12  1246   None    Chief Complaint  Patient presents with  . Eye Drainage  . Itchy Eye  . Nasal Congestion  . Medication Refill    HPI Pt says that she is out of her metformin medication and she has been having some difficulty getting her prescriptions refilled.  She is having itchy eyes and watery eyes.  They have been red, itchy and watery.  Pt has been rubbing eyes a lot.  Pt is checking her BS 2 times per day but BS have been in 200-300 range.     Past Medical History  Diagnosis Date  . Diabetes mellitus   . Hypertension   . MVP (mitral valve prolapse)     Past Surgical History  Procedure Date  . Hernia repair   . Cholecystectomy   . Cesarean section     No family history on file.  History  Substance Use Topics  . Smoking status: Current Every Day Smoker  . Smokeless tobacco: Not on file  . Alcohol Use: No    OB History    Grav Para Term Preterm Abortions TAB SAB Ect Mult Living                  Review of Systems  Constitutional: Negative.   HENT: Positive for congestion, rhinorrhea, sneezing and postnasal drip.   Eyes: Positive for discharge and itching.       Clear eye discharge   Respiratory: Negative.   Cardiovascular: Negative.   Gastrointestinal: Negative.   Genitourinary: Positive for urgency and frequency.  Musculoskeletal: Negative.   Neurological: Negative.   Psychiatric/Behavioral: Negative.     Allergies  Doxycycline hyclate  Home Medications   Current Outpatient Rx  Name  Route  Sig  Dispense  Refill  . ATENOLOL 25 MG PO TABS   Oral   Take 25 mg by mouth daily.         Marland Kitchen DIGOXIN 0.25 MG PO TABS   Oral   Take 250 mcg by mouth daily.         Marland Kitchen METFORMIN HCL 1000 MG PO TABS   Oral   Take 1 tablet (1,000 mg total) by mouth 2 (two) times daily with a meal.   60 tablet   0   . ATENOLOL 25 MG PO TABS   Oral   Take 1 tablet (25 mg total) by mouth daily.   30  tablet   0   . CETIRIZINE HCL 10 MG PO TABS   Oral   Take 1 tablet (10 mg total) by mouth daily.   30 tablet   0   . DIGOXIN 0.25 MG PO TABS   Oral   Take 1 tablet (0.25 mg total) by mouth daily.   30 tablet   0   . FLUTICASONE PROPIONATE 50 MCG/ACT NA SUSP   Nasal   Place 2 sprays into the nose daily.   16 g   0   . METFORMIN HCL 500 MG PO TABS   Oral   Take 500 mg by mouth 2 (two) times daily with a meal.         . SULFAMETHOXAZOLE-TRIMETHOPRIM 800-160 MG PO TABS   Oral   Take 1 tablet by mouth 2 (two) times daily.   14 tablet   0     BP 123/67  Pulse 76  Temp 98.2 F (36.8 C) (Oral)  Resp 21  SpO2 100%  Physical Exam  Constitutional: She is oriented to person, place, and time. She appears well-developed and well-nourished. No distress.  HENT:  Head: Normocephalic and atraumatic.  Nose: Mucosal edema and rhinorrhea present.  Eyes: Conjunctivae normal and EOM are normal. Pupils are equal, round, and reactive to light.  Neck: Normal range of motion. Neck supple. No JVD present. No thyromegaly present.  Pulmonary/Chest: Effort normal and breath sounds normal.  Abdominal: Soft. Bowel sounds are normal. She exhibits no distension and no mass. There is no tenderness. There is no guarding.  Musculoskeletal: Normal range of motion. She exhibits edema. She exhibits no tenderness.  Neurological: She is alert and oriented to person, place, and time.  Skin: Skin is warm and dry. No erythema.  Psychiatric: She has a normal mood and affect. Her behavior is normal. Judgment and thought content normal.    ED Course  Procedures (including critical care time)  Labs Reviewed - No data to display No results found.   No diagnosis found.    MDM  Type 2 DM Hyperglycemia Fatty Liver Elevated Liver Enzymes Noncompliance with meds Tobacco Abuse   A1c CMP cbg Start cetirizine 10 mg daily Visine Allergy OTC recommended Pt declined a flu vaccine Refilled  metformin 1000 mg po bid Counseled on diet, exercise, smoking cessation      Cleora Fleet, MD 05/05/12 2150

## 2012-05-05 NOTE — ED Notes (Signed)
Patient c/o itchy watery eyes past couple of days with slight yellowish d/c from both eyes, nasal congestion as well. Patient also needs refill on former medications

## 2012-05-06 LAB — TSH: TSH: 1.214 u[IU]/mL (ref 0.350–4.500)

## 2012-05-09 ENCOUNTER — Telehealth (HOSPITAL_COMMUNITY): Payer: Self-pay

## 2012-05-09 NOTE — Telephone Encounter (Signed)
Message copied by Lestine Mount on Fri May 09, 2012 10:38 AM ------      Message from: Vassie Moselle      Created: Tue May 06, 2012  2:51 PM      Regarding: Labs                   ----- Message -----         From: Lab In [place] Interface         Sent: 05/05/2012   3:12 PM           To: [facility] Follow Up

## 2012-06-06 ENCOUNTER — Encounter (HOSPITAL_COMMUNITY): Payer: Self-pay

## 2012-06-06 ENCOUNTER — Emergency Department (INDEPENDENT_AMBULATORY_CARE_PROVIDER_SITE_OTHER)
Admission: EM | Admit: 2012-06-06 | Discharge: 2012-06-06 | Disposition: A | Discharge status: Home / Self Care | Payer: No Typology Code available for payment source | Source: Home / Self Care

## 2012-06-06 DIAGNOSIS — J209 Acute bronchitis, unspecified: Secondary | ICD-10-CM

## 2012-06-06 MED ORDER — GUAIFENESIN 100 MG/5ML PO LIQD: 200.0000 mg | Freq: Three times a day (TID) | ORAL | Status: DC | PRN

## 2012-06-06 MED ORDER — ALBUTEROL SULFATE HFA 108 (90 BASE) MCG/ACT IN AERS: 2.0000 | INHALATION_SPRAY | Freq: Four times a day (QID) | RESPIRATORY_TRACT | Status: DC | PRN

## 2012-06-06 MED ORDER — LEVOFLOXACIN 500 MG PO TABS: 750.0000 mg | ORAL_TABLET | Freq: Every day | ORAL | Status: DC

## 2012-06-06 NOTE — ED Provider Notes (Signed)
History     CSN: [id number]  Arrival date & time 06/06/12  1242   None     Chief Complaint  Patient presents with  . URI    (Consider location/radiation/quality/duration/timing/severity/associated sxs/prior treatment) HPI Patient comes to the clinic with upper respiratory symptoms of stuffy nose cough, congestion, wheezing, yellow clear phlegm and runny nose. She also had low grade fever. The symptoms have been going on for past 5 days. She denies any chest pain, admits to having headache watery eyes.  Past Medical History  Diagnosis Date  . Diabetes mellitus   . Hypertension   . MVP (mitral valve prolapse)     Past Surgical History  Procedure Date  . Hernia repair   . Cholecystectomy   . Cesarean section     No family history on file.  History  Substance Use Topics  . Smoking status: Current Every Day Smoker  . Smokeless tobacco: Not on file  . Alcohol Use: No    OB History    Grav Para Term Preterm Abortions TAB SAB Ect Mult Living                  Review of Systems  Chest: No chest pain GI: No nausea vomiting or diarrhea See history of present illness for other review of systems Allergies  Doxycycline hyclate  Home Medications   Current Outpatient Rx  Name  Route  Sig  Dispense  Refill  . ATENOLOL 25 MG PO TABS   Oral   Take 25 mg by mouth daily.         Marland Kitchen DIGOXIN 0.25 MG PO TABS   Oral   Take 250 mcg by mouth daily.         Marland Kitchen METFORMIN HCL 1000 MG PO TABS   Oral   Take 1 tablet (1,000 mg total) by mouth 2 (two) times daily with a meal.   60 tablet   0   . ALBUTEROL SULFATE HFA 108 (90 BASE) MCG/ACT IN AERS   Inhalation   Inhale 2 puffs into the lungs every 6 (six) hours as needed for wheezing.   1 Inhaler   0   . ATENOLOL 25 MG PO TABS   Oral   Take 1 tablet (25 mg total) by mouth daily.   30 tablet   0   . CETIRIZINE HCL 10 MG PO TABS   Oral   Take 1 tablet (10 mg total) by mouth daily.   30 tablet   4   . DIGOXIN  0.25 MG PO TABS   Oral   Take 1 tablet (0.25 mg total) by mouth daily.   30 tablet   0   . GUAIFENESIN 100 MG/5ML PO LIQD   Oral   Take 10 mLs (200 mg total) by mouth 3 (three) times daily as needed for cough.   120 mL   0   . LEVOFLOXACIN 500 MG PO TABS   Oral   Take 1.5 tablets (750 mg total) by mouth daily.   5 tablet   0     BP 122/73  Pulse 72  Temp 98.3 F (36.8 C) (Oral)  Resp 21  SpO2 99%  Physical Exam  Constitutional:   Patient is a well-developed and well-nourished female in no acute distress and cooperative with exam. Head: Normocephalic and atraumatic Mouth: Mucus membranes moist Eyes: PERRL, EOMI, conjunctivae normal Neck: Supple, No Thyromegaly Cardiovascular: RRR, S1 normal, S2 normal Pulmonary/Chest: Bibasilar wheezing Abdominal: Soft. Non-tender, non-distended, bowel  sounds are normal, no masses, organomegaly, or guarding present.     ED Course  Procedures (including critical care time)  Labs Reviewed - No data to display No results found.   1. Acute bronchitis       MDM   We'll start the patient on Levaquin 500 mg by mouth daily for 5 days Albuterol inhaler 2 puffs every 4 hours when necessary for wheezing Will also give Robitussin 2 teaspoon every 4 hours when necessary for upper respiratory symptoms       Meredeth Ide, MD 06/06/12 1338

## 2012-06-06 NOTE — ED Notes (Signed)
Patient complains of cough congestion some wheezing nausea past 3 days

## 2012-06-18 ENCOUNTER — Encounter (HOSPITAL_COMMUNITY): Payer: Self-pay

## 2012-06-18 ENCOUNTER — Emergency Department (INDEPENDENT_AMBULATORY_CARE_PROVIDER_SITE_OTHER)
Admission: EM | Admit: 2012-06-18 | Discharge: 2012-06-18 | Disposition: A | Discharge status: Home / Self Care | Payer: No Typology Code available for payment source | Source: Home / Self Care

## 2012-06-18 DIAGNOSIS — F17200 Nicotine dependence, unspecified, uncomplicated: Secondary | ICD-10-CM

## 2012-06-18 DIAGNOSIS — J301 Allergic rhinitis due to pollen: Secondary | ICD-10-CM

## 2012-06-18 LAB — GLUCOSE, CAPILLARY: Glucose-Capillary: 173 mg/dL — ABNORMAL HIGH (ref 70–99)

## 2012-06-18 MED ORDER — ATENOLOL 25 MG PO TABS: 25.0000 mg | ORAL_TABLET | Freq: Every day | ORAL | Status: DC

## 2012-06-18 MED ORDER — ALBUTEROL SULFATE HFA 108 (90 BASE) MCG/ACT IN AERS: 2.0000 | INHALATION_SPRAY | Freq: Four times a day (QID) | RESPIRATORY_TRACT | Status: DC | PRN

## 2012-06-18 MED ORDER — DIGOXIN 250 MCG PO TABS: 0.2500 mg | ORAL_TABLET | Freq: Every day | ORAL | Status: DC

## 2012-06-18 MED ORDER — METFORMIN HCL 1000 MG PO TABS: 1000.0000 mg | ORAL_TABLET | Freq: Two times a day (BID) | ORAL | Status: DC

## 2012-06-18 NOTE — ED Notes (Signed)
Follow up- re check bronchitis- still complains of SOB cough and slight congestion

## 2012-06-18 NOTE — ED Provider Notes (Signed)
History   CSN: [id number]  Arrival date & time 06/18/12  1515  Chief Complaint  Patient presents with  . Follow-up  . Medication Refill  HPI The patient is presenting today to followup from her recent illness.  She had a severe bout of asthma exacerbation and acute bronchitis.  She was treated with levofloxacin and she is doing much better now.  She is not completely resolved because she reports that she still uses her albuterol inhaler nightly but she reports significant improvement.  She's having a nonproductive cough rarely.  The patient reports that she has been testing her blood glucose regularly and she has had high numbers in the 200 range.  She reports that she is taking her metformin.  She takes it twice per day.  She does continue to try to stop smoking.  She has not been successful at this time.  She reports that she did have one episode of brief palpitations when she was taking albuterol more regularly.  That has since completely resolved.  She continues to take her beta blocker and digoxin.   Past Medical History  Diagnosis Date  . Diabetes mellitus   . Hypertension   . MVP (mitral valve prolapse)     Past Surgical History  Procedure Date  . Hernia repair   . Cholecystectomy   . Cesarean section     No family history on file.  History  Substance Use Topics  . Smoking status: Current Every Day Smoker  . Smokeless tobacco: Not on file  . Alcohol Use: No    OB History    Grav Para Term Preterm Abortions TAB SAB Ect Mult Living                  Review of Systems  Constitutional: Negative.   HENT: Negative.   Eyes: Negative.   Respiratory: Negative.   Cardiovascular: Negative.   Gastrointestinal: Negative.   Musculoskeletal: Negative.   Hematological: Negative.   Psychiatric/Behavioral: Negative.     Allergies  Doxycycline hyclate  Home Medications   Current Outpatient Rx  Name  Route  Sig  Dispense  Refill  . ALBUTEROL SULFATE HFA 108 (90 BASE)  MCG/ACT IN AERS   Inhalation   Inhale 2 puffs into the lungs every 6 (six) hours as needed for wheezing.   1 Inhaler   1   . ATENOLOL 25 MG PO TABS   Oral   Take 1 tablet (25 mg total) by mouth daily.   30 tablet   3   . CETIRIZINE HCL 10 MG PO TABS   Oral   Take 1 tablet (10 mg total) by mouth daily.   30 tablet   4   . DIGOXIN 0.25 MG PO TABS   Oral   Take 250 mcg by mouth daily.         Marland Kitchen DIGOXIN 0.25 MG PO TABS   Oral   Take 1 tablet (0.25 mg total) by mouth daily.   30 tablet   3   . GUAIFENESIN 100 MG/5ML PO LIQD   Oral   Take 10 mLs (200 mg total) by mouth 3 (three) times daily as needed for cough.   120 mL   0   . LEVOFLOXACIN 500 MG PO TABS   Oral   Take 1.5 tablets (750 mg total) by mouth daily.   5 tablet   0   . METFORMIN HCL 1000 MG PO TABS   Oral   Take 1 tablet (1,000  mg total) by mouth 2 (two) times daily with a meal.   60 tablet   3     BP 122/71  Pulse 62  Temp 98.7 F (37.1 C) (Oral)  Resp 18  SpO2 100%  Physical Exam  Nursing note and vitals reviewed. Constitutional: She is oriented to person, place, and time. She appears well-developed and well-nourished. No distress.  HENT:  Head: Normocephalic and atraumatic.  Eyes: EOM are normal. Pupils are equal, round, and reactive to light.  Neck: Normal range of motion. Neck supple.  Cardiovascular: Normal rate, regular rhythm and normal heart sounds.   Pulmonary/Chest: Effort normal and breath sounds normal.  Abdominal: Soft. Bowel sounds are normal.  Musculoskeletal: Normal range of motion. She exhibits no edema and no tenderness.  Neurological: She is alert and oriented to person, place, and time.  Skin: Skin is warm and dry.  Psychiatric: She has a normal mood and affect. Her behavior is normal. Judgment and thought content normal.    ED Course  Procedures (including critical care time)  Labs Reviewed - No data to display No results found.  1. TOBACCO ABUSE   2. ALLERGIC  RHINITIS, SEASONAL    Finger stick Blood Sugar 173   MDM  IMPRESSION  HTN  Palpatations  Type 2 DM, poorly controlled as evidenced by an A1c of 9.8%  Tobacco dependence  RECOMMENDATIONS / PLAN Pt declined to add new diabetes medications to her regimen.  Strongly encouraged her to stop smoking cigarettes and offered medical assistance Pt will come up here in 1 week to get a flu vaccine  FOLLOW UP 2 months   The patient was given clear instructions to go to ER or return to medical center if symptoms don't improve, worsen or new problems develop.  The patient verbalized understanding.  The patient was told to call to get lab results if they haven't heard anything in the next week.            Cleora Fleet, MD 06/18/12 1851

## 2012-07-28 ENCOUNTER — Encounter (HOSPITAL_COMMUNITY): Payer: Self-pay

## 2012-07-28 ENCOUNTER — Emergency Department (INDEPENDENT_AMBULATORY_CARE_PROVIDER_SITE_OTHER)
Admission: EM | Admit: 2012-07-28 | Discharge: 2012-07-28 | Disposition: A | Discharge status: Home / Self Care | Payer: No Typology Code available for payment source | Source: Home / Self Care | Attending: Family Medicine | Admitting: Family Medicine

## 2012-07-28 ENCOUNTER — Emergency Department (INDEPENDENT_AMBULATORY_CARE_PROVIDER_SITE_OTHER): Payer: No Typology Code available for payment source

## 2012-07-28 ENCOUNTER — Ambulatory Visit (HOSPITAL_COMMUNITY)
Admit: 2012-07-28 | Discharge: 2012-07-28 | Disposition: A | Discharge status: Home / Self Care | Payer: No Typology Code available for payment source | Attending: Family Medicine | Admitting: Family Medicine

## 2012-07-28 DIAGNOSIS — M79609 Pain in unspecified limb: Secondary | ICD-10-CM

## 2012-07-28 DIAGNOSIS — F17200 Nicotine dependence, unspecified, uncomplicated: Secondary | ICD-10-CM

## 2012-07-28 DIAGNOSIS — R609 Edema, unspecified: Secondary | ICD-10-CM

## 2012-07-28 DIAGNOSIS — M25561 Pain in right knee: Secondary | ICD-10-CM

## 2012-07-28 DIAGNOSIS — M25569 Pain in unspecified knee: Secondary | ICD-10-CM

## 2012-07-28 DIAGNOSIS — J301 Allergic rhinitis due to pollen: Secondary | ICD-10-CM

## 2012-07-28 DIAGNOSIS — J209 Acute bronchitis, unspecified: Secondary | ICD-10-CM

## 2012-07-28 DIAGNOSIS — E119 Type 2 diabetes mellitus without complications: Secondary | ICD-10-CM

## 2012-07-28 DIAGNOSIS — J309 Allergic rhinitis, unspecified: Secondary | ICD-10-CM

## 2012-07-28 DIAGNOSIS — J40 Bronchitis, not specified as acute or chronic: Secondary | ICD-10-CM

## 2012-07-28 MED ORDER — ALBUTEROL SULFATE (5 MG/ML) 0.5% IN NEBU
2.5000 mg | INHALATION_SOLUTION | Freq: Once | RESPIRATORY_TRACT | Status: AC
  Administered 2012-07-28: 2.5 mg via RESPIRATORY_TRACT

## 2012-07-28 MED ORDER — ALBUTEROL SULFATE HFA 108 (90 BASE) MCG/ACT IN AERS: 2.0000 | INHALATION_SPRAY | Freq: Four times a day (QID) | RESPIRATORY_TRACT | Status: DC | PRN

## 2012-07-28 MED ORDER — CETIRIZINE-PSEUDOEPHEDRINE ER 5-120 MG PO TB12: ORAL_TABLET | ORAL | Status: DC

## 2012-07-28 MED ORDER — NICOTINE 14 MG/24HR TD PT24: 1.0000 | MEDICATED_PATCH | TRANSDERMAL | Status: DC

## 2012-07-28 MED ORDER — IPRATROPIUM BROMIDE 0.02 % IN SOLN
0.5000 mg | Freq: Once | RESPIRATORY_TRACT | Status: AC
  Administered 2012-07-28: 0.5 mg via RESPIRATORY_TRACT

## 2012-07-28 MED ORDER — ALBUTEROL SULFATE (5 MG/ML) 0.5% IN NEBU
INHALATION_SOLUTION | RESPIRATORY_TRACT | Status: AC
  Filled 2012-07-28: qty 0.5

## 2012-07-28 MED ORDER — AMOXICILLIN 875 MG PO TABS: 875.0000 mg | ORAL_TABLET | Freq: Two times a day (BID) | ORAL | Status: DC

## 2012-07-28 NOTE — Progress Notes (Signed)
VASCULAR LAB PRELIMINARY  PRELIMINARY  PRELIMINARY  PRELIMINARY  Right lower extremity venous duplex completed.    Preliminary report:  Right:  No evidence of DVT, superficial thrombosis, or Baker's cyst.  Marsha Gundlach, RVS 07/28/2012, 7:02 PM

## 2012-07-28 NOTE — ED Notes (Signed)
Vascular lab called doppler negative for DVT

## 2012-07-28 NOTE — ED Notes (Signed)
Patient has venous doppler to right leg today at [facility] report to admitting. Spoke with [employer]

## 2012-07-28 NOTE — ED Provider Notes (Signed)
History     CSN: [id number]  Arrival date & time 07/28/12  1519   First MD Initiated Contact with Patient 07/28/12 1549     Chief Complaint  Patient presents with  . URI   The history is provided by the patient.   Pt says that she has been having bronchitis.  She says that she wheezing and having some cough and congestion.  She says that she is also having some pain behind the right knee but says that she is concerned about a blood clot.  She says that it hurts behind the right knee.  She says no swelling in the right knee or leg.  She says it has been present for about 2 weeks.  She says she was having chills a few days ago but started taking some old amoxicillin prescription that she had at home and started to feel better.  She has been taking the amoxicillin for the last 4 days.   She says that she has not been taking the cetirizine that was prescribed to her back in November.   She says that she stopped smoking 4 days ago.      Past Medical History  Diagnosis Date  . Diabetes mellitus   . Hypertension   . MVP (mitral valve prolapse)     Past Surgical History  Procedure Laterality Date  . Hernia repair    . Cholecystectomy    . Cesarean section      No family history on file.  History  Substance Use Topics  . Smoking status: Current Every Day Smoker  . Smokeless tobacco: Not on file  . Alcohol Use: No    OB History   Grav Para Term Preterm Abortions TAB SAB Ect Mult Living                 Review of Systems  HENT: Positive for congestion and rhinorrhea.   Respiratory: Positive for cough, shortness of breath and wheezing.   All other systems reviewed and are negative.    Allergies  Doxycycline hyclate  Home Medications   Current Outpatient Rx  Name  Route  Sig  Dispense  Refill  . albuterol (PROVENTIL HFA;VENTOLIN HFA) 108 (90 BASE) MCG/ACT inhaler   Inhalation   Inhale 2 puffs into the lungs every 6 (six) hours as needed for wheezing.   1 Inhaler   1    . atenolol (TENORMIN) 25 MG tablet   Oral   Take 1 tablet (25 mg total) by mouth daily.   30 tablet   3   . cetirizine (ZYRTEC) 10 MG tablet   Oral   Take 1 tablet (10 mg total) by mouth daily.   30 tablet   4   . digoxin (LANOXIN) 0.25 MG tablet   Oral   Take 250 mcg by mouth daily.         . digoxin (LANOXIN) 0.25 MG tablet   Oral   Take 1 tablet (0.25 mg total) by mouth daily.   30 tablet   3   . guaiFENesin (ROBITUSSIN) 100 MG/5ML liquid   Oral   Take 10 mLs (200 mg total) by mouth 3 (three) times daily as needed for cough.   120 mL   0   . levofloxacin (LEVAQUIN) 500 MG tablet   Oral   Take 1.5 tablets (750 mg total) by mouth daily.   5 tablet   0   . metFORMIN (GLUCOPHAGE) 1000 MG tablet   Oral  Take 1 tablet (1,000 mg total) by mouth 2 (two) times daily with a meal.   60 tablet   3     BP 147/89  Pulse 73  Temp(Src) 98.6 F (37 C) (Oral)  SpO2 95%  Physical Exam  Nursing note and vitals reviewed. Constitutional: She is oriented to person, place, and time. She appears well-developed and well-nourished. No distress.  HENT:  Head: Normocephalic and atraumatic.  Right Ear: External ear normal.  Left Ear: External ear normal.  Nose: Mucosal edema and rhinorrhea present.  Eyes: Conjunctivae and EOM are normal. Pupils are equal, round, and reactive to light.  Neck: Normal range of motion. Neck supple. No JVD present. No tracheal deviation present. No thyromegaly present.  Cardiovascular: Normal rate, regular rhythm and normal heart sounds.   Pulmonary/Chest: Effort normal. No respiratory distress. She has wheezes. She has no rales. She exhibits no tenderness.  Abdominal: Soft. Bowel sounds are normal. She exhibits no distension and no mass. There is no tenderness. There is no rebound and no guarding.  Musculoskeletal: Normal range of motion. She exhibits tenderness. She exhibits no edema.  Tenderness behind the right knee no calf cords or edema  present  Lymphadenopathy:    She has no cervical adenopathy.  Neurological: She is alert and oriented to person, place, and time.  Skin: Skin is warm and dry.  Psychiatric: She has a normal mood and affect. Her behavior is normal. Judgment and thought content normal.    ED Course  Procedures (including critical care time)  Labs Reviewed - No data to display No results found.  No diagnosis found.  MDM  IMPRESSION  Right knee pain   Bronchitis  Smoker  Allergic Rhinitis  RECOMMENDATIONS / PLAN Send for xray right knee and doppler US to rule out DVT Amoxicillin 875 mg po bid  Zyrtec D take 1 po BID Albuterol HFA Duoneb treatment given in office today Xray right knee Update:  Xray negative for acute, Doppler US neg for DVT and baker's cyst  FOLLOW UP 1 month   The patient was given clear instructions to go to ER or return to medical center if symptoms don't improve, worsen or new problems develop.  The patient verbalized understanding.  The patient was told to call to get lab results if they haven't heard anything in the next week.            Cleora Fleet, MD 07/28/12 2016

## 2012-07-28 NOTE — ED Notes (Signed)
Patient states has a history of bronchitis has been congested Hurts to cough, pain to the back of her upper right leg

## 2012-08-04 ENCOUNTER — Telehealth (HOSPITAL_COMMUNITY): Payer: Self-pay

## 2012-08-04 NOTE — Telephone Encounter (Signed)
Message copied by Lestine Mount on Mon Aug 04, 2012 10:02 AM ------      Message from: Cleora Fleet      Created: Mon Jul 28, 2012  8:17 PM      Regarding: xray results        Please notify patient that the x-ray of her knee and the Doppler ultrasound of her right lower extremity negative for DVT and the x-ray was also negative for any acute findings.                  Rodney Langton, MD, CDE, FAAFP      [facility]      [facility]      [place], [place]        ------

## 2012-08-13 ENCOUNTER — Encounter (HOSPITAL_COMMUNITY): Payer: Self-pay | Admitting: *Deleted

## 2012-08-13 ENCOUNTER — Emergency Department (INDEPENDENT_AMBULATORY_CARE_PROVIDER_SITE_OTHER)
Admission: EM | Admit: 2012-08-13 | Discharge: 2012-08-13 | Disposition: A | Discharge status: Home / Self Care | Payer: No Typology Code available for payment source | Source: Home / Self Care

## 2012-08-13 DIAGNOSIS — J301 Allergic rhinitis due to pollen: Secondary | ICD-10-CM

## 2012-08-13 DIAGNOSIS — J309 Allergic rhinitis, unspecified: Secondary | ICD-10-CM

## 2012-08-13 DIAGNOSIS — F17200 Nicotine dependence, unspecified, uncomplicated: Secondary | ICD-10-CM

## 2012-08-13 MED ORDER — METHYLPREDNISOLONE 4 MG PO KIT: PACK | ORAL | Status: DC

## 2012-08-13 MED ORDER — ALBUTEROL SULFATE HFA 108 (90 BASE) MCG/ACT IN AERS: 2.0000 | INHALATION_SPRAY | Freq: Four times a day (QID) | RESPIRATORY_TRACT | Status: DC | PRN

## 2012-08-13 MED ORDER — FLUTICASONE PROPIONATE 50 MCG/ACT NA SUSP: 2.0000 | Freq: Every day | NASAL | Status: DC

## 2012-08-13 MED ORDER — CETIRIZINE-PSEUDOEPHEDRINE ER 5-120 MG PO TB12: ORAL_TABLET | ORAL | Status: DC

## 2012-08-13 NOTE — ED Provider Notes (Signed)
History     CSN: [id number]  Arrival date & time 08/13/12  1114   First MD Initiated Contact with Patient 08/13/12 1139      Chief Complaint  Patient presents with  . URI    Shin with history of allergic rhinitis, tobacco abuse, type 2 diabetes mellitus, hypertension and mitral valve prolapse presents with sinus congestion, postnasal drip and some intermittent cough. She denies any shortness of breath, no fever chills, no chest pain.   HPI  Past Medical History  Diagnosis Date  . Diabetes mellitus   . Hypertension   . MVP (mitral valve prolapse)     Past Surgical History  Procedure Laterality Date  . Hernia repair    . Cholecystectomy    . Cesarean section      No family history on file.  History  Substance Use Topics  . Smoking status: Current Every Day Smoker  . Smokeless tobacco: Not on file  . Alcohol Use: No    OB History   Grav Para Term Preterm Abortions TAB SAB Ect Mult Living                  Review of Systems  Positive sinus congestion, positive postnasal drip, some intermittent cough. Some intermittent wheezing.  No fever chills, no shortness of breath, no chest pain, no abdominal pain, no diarrhea, no focal weakness.  Allergies  Doxycycline hyclate  Home Medications   Current Outpatient Rx  Name  Route  Sig  Dispense  Refill  . albuterol (PROVENTIL HFA;VENTOLIN HFA) 108 (90 BASE) MCG/ACT inhaler   Inhalation   Inhale 2 puffs into the lungs every 6 (six) hours as needed for wheezing.   1 Inhaler   5   . amoxicillin (AMOXIL) 875 MG tablet   Oral   Take 1 tablet (875 mg total) by mouth 2 (two) times daily.   20 tablet   0   . atenolol (TENORMIN) 25 MG tablet   Oral   Take 1 tablet (25 mg total) by mouth daily.   30 tablet   3   . cetirizine-pseudoephedrine (ZYRTEC-D) 5-120 MG per tablet      Take 1 po every 12 hours prn nasal and head congestion   30 tablet   0   . digoxin (LANOXIN) 0.25 MG tablet   Oral   Take 1 tablet  (0.25 mg total) by mouth daily.   30 tablet   3   . fluticasone (FLONASE) 50 MCG/ACT nasal spray   Nasal   Place 2 sprays into the nose daily.   1 g   2   . metFORMIN (GLUCOPHAGE) 1000 MG tablet   Oral   Take 1 tablet (1,000 mg total) by mouth 2 (two) times daily with a meal.   60 tablet   3   . methylPREDNISolone (MEDROL, PAK,) 4 MG tablet      follow package directions   21 tablet   0   . nicotine (NICODERM CQ) 14 mg/24hr patch   Transdermal   Place 1 patch onto the skin daily.   28 patch   0     BP 128/48  Pulse 96  Temp(Src) 98.4 F (36.9 C) (Oral)  Resp 18  SpO2 100%  Physical Exam Awake alert oriented x3 Positive postnasal drip No frontal or maxillary sinus tenderness Good breath sounds bilaterally no rhonchi or wheezes at this time S1-S2 No edema  ED Course  Procedures (including critical care time)  Labs Reviewed -  No data to display No results found.   1. TOBACCO ABUSE   2. Allergic rhinitis       MDM   Patient counseled to quit tobacco use, continue nicotine patches.  For allergic rhinitis with postnasal drip and cough prescribed patient Flonase, Zyrtec,  She says she has intermittent wheezing, will prescribe her a short course of Medrol Dosepak and give her refill of her albuterol inhaler. Again on exam she has no wheezing excellent air movement bilaterally and she's not short of breath. Could have mild reactive airway disease due to her ongoing allergies and postnasal drip.        Leroy Sea, MD 08/13/12 [phone]

## 2012-08-13 NOTE — ED Notes (Signed)
Patient complains of sinus congestion and drainage with cough and chest congestion x 2 weeks.

## 2012-08-13 NOTE — ED Notes (Signed)
Written prescription given to patient for Metformin 1000mg  BiD with meals number 60. Refills 2. By Dr. Thedore Mins.

## 2012-08-18 ENCOUNTER — Telehealth (HOSPITAL_COMMUNITY): Payer: Self-pay

## 2012-08-18 ENCOUNTER — Encounter (HOSPITAL_COMMUNITY): Payer: Self-pay

## 2012-08-18 ENCOUNTER — Emergency Department (INDEPENDENT_AMBULATORY_CARE_PROVIDER_SITE_OTHER)
Admission: EM | Admit: 2012-08-18 | Discharge: 2012-08-18 | Disposition: A | Discharge status: Home / Self Care | Payer: No Typology Code available for payment source | Source: Home / Self Care

## 2012-08-18 DIAGNOSIS — J209 Acute bronchitis, unspecified: Secondary | ICD-10-CM

## 2012-08-18 DIAGNOSIS — J019 Acute sinusitis, unspecified: Secondary | ICD-10-CM

## 2012-08-18 DIAGNOSIS — R0982 Postnasal drip: Secondary | ICD-10-CM

## 2012-08-18 MED ORDER — LEVOFLOXACIN 750 MG PO TABS: 750.0000 mg | ORAL_TABLET | Freq: Every day | ORAL | Status: DC

## 2012-08-18 MED ORDER — OXYMETAZOLINE HCL 0.05 % NA SOLN: 2.0000 | Freq: Two times a day (BID) | NASAL | Status: DC

## 2012-08-18 NOTE — ED Provider Notes (Signed)
History     CSN: [id number]  Arrival date & time 08/18/12  1653   First MD Initiated Contact with Patient 08/18/12 1716      Chief Complaint  Patient presents with  . URI    (Consider location/radiation/quality/duration/timing/severity/associated sxs/prior treatment) HPI Mrs. Hattabaugh is a 52 year old woman with no PMH of asthma or lung problems, who developed an URI 3 months ago.  She initially was treated with 5 days of Levaquin and her symptoms appeared to resolve temporarily. Saw Dr. Laural Benes 06/18/12, but no specific treatment recommended except to stop smoking. Seasonal allergies were noted on her problem list. She saw him again on 07/28/12 and was put on Amoxicillin, Zyrtec D, and Albuterol HFA.  Was given a duoneb in the office at that time.  Saw Dr. Thedore Mins 08/13/12 for non-resolution of symptoms.  Given a Medrol dosepack, which she completed today.  No fever or chills.  Has a cough productive of light yellow mucous.  She also reports that she is able to smell her nasal drainage.  Past Medical History  Diagnosis Date  . Diabetes mellitus   . Hypertension   . MVP (mitral valve prolapse)     Past Surgical History  Procedure Laterality Date  . Hernia repair    . Cholecystectomy    . Cesarean section      No family history on file.  History  Substance Use Topics  . Smoking status: Current Every Day Smoker  . Smokeless tobacco: Not on file  . Alcohol Use: No    OB History   Grav Para Term Preterm Abortions TAB SAB Ect Mult Living                  Review of Systems +Pain/pressure in sinuses, sinus congestion.  No energy.  + nausea and decreased appetite.  No vomiting or diarrhea.  No chest pain.    Allergies  Doxycycline hyclate  Home Medications   Current Outpatient Rx  Name  Route  Sig  Dispense  Refill  . albuterol (PROVENTIL HFA;VENTOLIN HFA) 108 (90 BASE) MCG/ACT inhaler   Inhalation   Inhale 2 puffs into the lungs every 6 (six) hours as needed for  wheezing.   1 Inhaler   5   . amoxicillin (AMOXIL) 875 MG tablet   Oral   Take 1 tablet (875 mg total) by mouth 2 (two) times daily.   20 tablet   0   . atenolol (TENORMIN) 25 MG tablet   Oral   Take 1 tablet (25 mg total) by mouth daily.   30 tablet   3   . cetirizine-pseudoephedrine (ZYRTEC-D) 5-120 MG per tablet      Take 1 po every 12 hours prn nasal and head congestion   30 tablet   0   . digoxin (LANOXIN) 0.25 MG tablet   Oral   Take 1 tablet (0.25 mg total) by mouth daily.   30 tablet   3   . fluticasone (FLONASE) 50 MCG/ACT nasal spray   Nasal   Place 2 sprays into the nose daily.   1 g   2   . metFORMIN (GLUCOPHAGE) 1000 MG tablet   Oral   Take 1 tablet (1,000 mg total) by mouth 2 (two) times daily with a meal.   60 tablet   3   . methylPREDNISolone (MEDROL, PAK,) 4 MG tablet      follow package directions   21 tablet   0   . nicotine (  NICODERM CQ) 14 mg/24hr patch   Transdermal   Place 1 patch onto the skin daily.   28 patch   0     BP 129/82  Pulse 84  Temp(Src) 98.7 F (37.1 C) (Oral)  SpO2 97%  Physical Exam General: Prominent nasal congestion but otherwise no acute distress. HEENT: Normocephalic, atraumatic. PERRLA. EOMI. Nasal mucosa boggy and extremely swollen. Posterior pharyngeal cobblestoning. No tonsillar exudates. Postnasal drainage appreciated. Neck: Supple, no thyromegaly, no lymphadenopathy, no jugular venous distention.  Chest: Lungs clear to auscultation bilaterally with good air movement. No wheezes appreciated. Heart: Regular rate, and rhythm. No murmurs, rubs, or gallops. Abdomen: Soft, nontender, nondistended with normal active bowel sounds. Extremities: No clubbing, edema, or cyanosis. Skin: Warm and dry. No rashes. Psychiatric: Mood and affect normal. ED Course  Procedures (including critical care time)  Labs Reviewed - No data to display No results found.   No diagnosis found.    MDM  1. Sinusitis with  postnasal drip and resultant bronchitis: The patient's symptoms initially improved on Levaquin but she only received 5 days of treatment. I suspect she has a sinus infection that is triggering significant postnasal drainage and resultant bronchial irritation. We'll treat for a two-week course with Levaquin at 750 mg. Patient was instructed that if her symptoms do not resolve after a full two-week course of antibiotics, she would need to return to have a CT scan of her sinuses done to evaluate these further. Patient was also prescribed Afrin nasal spray to use 20 minutes before attempting to take a dose of her Flonase, as her nasal tissue is so swollen, the Flonase cannot get up into the sinus area to affect the swelling there. The patient was instructed to continue her Zyrtec-D.        Maryruth Bun Rama, MD 08/18/12 [phone]

## 2012-08-18 NOTE — ED Notes (Signed)
Patient complains of not feeling any better Cough, chest congestion yellow discharge from nose

## 2012-11-06 ENCOUNTER — Telehealth: Payer: Self-pay | Admitting: Family Medicine

## 2012-11-07 ENCOUNTER — Telehealth: Payer: Self-pay | Admitting: *Deleted

## 2012-11-07 NOTE — Telephone Encounter (Signed)
11/07/12 Patient not available message left to return call P.Luiz Ochoa

## 2012-11-12 ENCOUNTER — Ambulatory Visit: Payer: No Typology Code available for payment source

## 2012-11-21 ENCOUNTER — Ambulatory Visit: Payer: No Typology Code available for payment source | Attending: Family Medicine

## 2012-11-21 ENCOUNTER — Telehealth: Payer: Self-pay | Admitting: Family Medicine

## 2012-11-21 DIAGNOSIS — E119 Type 2 diabetes mellitus without complications: Secondary | ICD-10-CM

## 2012-11-21 LAB — POCT GLYCOSYLATED HEMOGLOBIN (HGB A1C): Hemoglobin A1C: 8.8

## 2012-11-21 MED ORDER — METFORMIN HCL 1000 MG PO TABS: 1000.0000 mg | ORAL_TABLET | Freq: Two times a day (BID) | ORAL | Status: DC

## 2012-11-21 NOTE — Telephone Encounter (Signed)
Pt needs meds

## 2012-11-24 ENCOUNTER — Ambulatory Visit: Payer: No Typology Code available for payment source | Attending: Family Medicine | Admitting: Internal Medicine

## 2012-11-24 VITALS — BP 125/82 | HR 74 | Temp 98.8°F | Resp 16 | Ht 67.5 in | Wt 258.0 lb

## 2012-11-24 DIAGNOSIS — I471 Supraventricular tachycardia: Secondary | ICD-10-CM

## 2012-11-24 DIAGNOSIS — E119 Type 2 diabetes mellitus without complications: Secondary | ICD-10-CM | POA: Insufficient documentation

## 2012-11-24 DIAGNOSIS — J301 Allergic rhinitis due to pollen: Secondary | ICD-10-CM | POA: Insufficient documentation

## 2012-11-24 MED ORDER — LORATADINE 10 MG PO TABS
10.0000 mg | ORAL_TABLET | Freq: Every day | ORAL | Status: DC | PRN
Start: 2012-11-24 — End: 2013-02-12

## 2012-11-24 NOTE — Progress Notes (Signed)
Patient ID: Breanna Berger, female   DOB: 18-May-1961, 52 y.o.   MRN: [id number] Patient Demographics  Breanna Berger, is a 52 y.o. female  WUJ:[id number]  [id number]  DOB - March 16, 1961  Chief Complaint  Patient presents with  . Follow-up    breast abcess, rt shuolder pain        Subjective:   Breanna Berger today is here for a follow up visit. She is here for a regular followup visit, she claims she recently had a small boil on her left breast that has completely resolved. Her only complaints are bilateral eye itchiness and redness that completely resolves after she put some unknown eyedrops.  Patient has No headache, No chest pain, No abdominal pain - No Nausea, No new weakness tingling or numbness, No Cough - SOB.   Objective:    Filed Vitals:   11/24/12 1105  BP: 125/82  Pulse: 74  Temp: 98.8 F (37.1 C)  TempSrc: Oral  Resp: 16  Height: 5' 7.5" (1.715 m)  Weight: 258 lb (117.028 kg)  SpO2: 99%     ALLERGIES:   Allergies  Allergen Reactions  . Doxycycline Hyclate     REACTION: rash    PAST MEDICAL HISTORY: Past Medical History  Diagnosis Date  . Diabetes mellitus   . Hypertension   . MVP (mitral valve prolapse)     MEDICATIONS AT HOME: Prior to Admission medications   Medication Sig Start Date End Date Taking? Authorizing Provider  atenolol (TENORMIN) 25 MG tablet Take 1 tablet (25 mg total) by mouth daily. 06/18/12  Yes Clanford Cyndie Mull, MD  digoxin (LANOXIN) 0.25 MG tablet Take 1 tablet (0.25 mg total) by mouth daily. 06/18/12  Yes Clanford Cyndie Mull, MD  metFORMIN (GLUCOPHAGE) 1000 MG tablet Take 1 tablet (1,000 mg total) by mouth 2 (two) times daily with a meal. 11/21/12  Yes Clanford L Johnson, MD  nicotine (NICODERM CQ) 14 mg/24hr patch Place 1 patch onto the skin daily. 07/28/12  Yes Clanford Cyndie Mull, MD  albuterol (PROVENTIL HFA;VENTOLIN HFA) 108 (90 BASE) MCG/ACT inhaler Inhale 2 puffs into the lungs every 6 (six) hours as needed for  wheezing. 08/13/12   Leroy Sea, MD  cetirizine-pseudoephedrine (ZYRTEC-D) 5-120 MG per tablet Take 1 po every 12 hours prn nasal and head congestion 08/13/12   Leroy Sea, MD  fluticasone (FLONASE) 50 MCG/ACT nasal spray Place 2 sprays into the nose daily. 08/13/12   Leroy Sea, MD  oxymetazoline (AFRIN NASAL SPRAY) 0.05 % nasal spray Place 2 sprays into the nose 2 (two) times daily. Do not use for more than 3 days in a row. Use 20 minutes prior to administering your Flonase dose. 08/18/12   Maryruth Bun Rama, MD     Exam  General appearance :Awake, alert, not in any distress. Speech Clear. Not toxic Looking HEENT: Atraumatic and Normocephalic, pupils equally reactive to light and accomodation Neck: supple, no JVD. No cervical lymphadenopathy.  Chest:Good air entry bilaterally, no added sounds  CVS: S1 S2 regular, no murmurs.  Abdomen: Bowel sounds present, Non tender and not distended with no gaurding, rigidity or rebound. Extremities: B/L Lower Ext shows no edema, both legs are warm to touch Neurology: Awake alert, and oriented X 3, CN II-XII intact, Non focal Breast Exam-no masses felt, no axillary lymphadenpathy. Skin and nipple appear normal Skin:No Rash Wounds:N/A    Data Review   CBC No results found for this basename: WBC, HGB, HCT, PLT, MCV, MCH, MCHC, RDW, NEUTRABS,  LYMPHSABS, MONOABS, EOSABS, BASOSABS, BANDABS, BANDSABD,  in the last 168 hours  Chemistries   No results found for this basename: NA, K, CL, CO2, GLUCOSE, BUN, CREATININE, GFRCGP, CALCIUM, MG, AST, ALT, ALKPHOS, BILITOT,  in the last 168 hours ------------------------------------------------------------------------------------------------------------------  Recent Labs  11/21/12 1806  HGBA1C 8.8   ------------------------------------------------------------------------------------------------------------------ No results found for this basename: CHOL, HDL, LDLCALC, TRIG, CHOLHDL, LDLDIRECT,  in  the last 72 hours ------------------------------------------------------------------------------------------------------------------ No results found for this basename: TSH, T4TOTAL, FREET3, T3FREE, THYROIDAB,  in the last 72 hours ------------------------------------------------------------------------------------------------------------------ No results found for this basename: VITAMINB12, FOLATE, FERRITIN, TIBC, IRON, RETICCTPCT,  in the last 72 hours  Coagulation profile  No results found for this basename: INR, PROTIME,  in the last 168 hours    Assessment & Plan   Diabetes - Last A1c in 6/13 was 8.8- will continue with metformin 1000 mg twice a day, recheck in the next 3-6 months, if still elevated will add a second agent  History of paroxysmal SVT - Doing well on digoxin and atenolol. - Will check a dig level prior to next visit  Seasonal allergies - As needed Claritin.  Gen. health maintenance - Mammography- will order - Pap smear- will refer to GYN - When she turns 52 in a few months she will need referral to GI for a colonoscopy or a FOBT stool. -refer to Opthalmology for retinopathy screening  Follow up in 2 months

## 2012-11-24 NOTE — Progress Notes (Signed)
Patient states breast abcess has resolved; states rt shoulder pain and redness and itching in eyes when she wakes in the mornings.

## 2012-11-26 ENCOUNTER — Ambulatory Visit: Payer: No Typology Code available for payment source | Attending: Family Medicine

## 2012-11-26 DIAGNOSIS — J301 Allergic rhinitis due to pollen: Secondary | ICD-10-CM

## 2012-11-26 DIAGNOSIS — E119 Type 2 diabetes mellitus without complications: Secondary | ICD-10-CM

## 2012-11-26 DIAGNOSIS — I471 Supraventricular tachycardia: Secondary | ICD-10-CM

## 2012-11-26 LAB — LIPID PANEL
Cholesterol: 206 mg/dL — ABNORMAL HIGH (ref 0–200)
Total CHOL/HDL Ratio: 5.2 Ratio

## 2012-11-26 LAB — TSH: TSH: 1.134 u[IU]/mL (ref 0.350–4.500)

## 2012-11-26 LAB — CBC
Hemoglobin: 12.7 g/dL (ref 12.0–15.0)
MCHC: 32 g/dL (ref 30.0–36.0)

## 2012-12-15 ENCOUNTER — Other Ambulatory Visit: Payer: Self-pay | Admitting: Family Medicine

## 2012-12-15 ENCOUNTER — Telehealth: Payer: Self-pay | Admitting: Family Medicine

## 2012-12-15 DIAGNOSIS — D72829 Elevated white blood cell count, unspecified: Secondary | ICD-10-CM

## 2012-12-15 NOTE — Telephone Encounter (Signed)
Patient needs medication sent to pharmacy  Atenolol and digoxin

## 2012-12-15 NOTE — Telephone Encounter (Signed)
Pt was seen on 11/21/12 and discussed meds with Dr.  Dr put in script for metformin but not for atenolol (TENORMIN) 25 MG tablet and digoxin (LANOXIN) 0.25 MG tablet, pt waiting.

## 2012-12-16 ENCOUNTER — Other Ambulatory Visit: Payer: Self-pay | Admitting: Family Medicine

## 2012-12-16 MED ORDER — ATENOLOL 25 MG PO TABS: 25.0000 mg | ORAL_TABLET | Freq: Every day | ORAL | Status: DC

## 2012-12-16 MED ORDER — DIGOXIN 250 MCG PO TABS: 0.2500 mg | ORAL_TABLET | Freq: Every day | ORAL | Status: DC

## 2012-12-17 ENCOUNTER — Ambulatory Visit: Payer: No Typology Code available for payment source

## 2012-12-19 NOTE — Telephone Encounter (Signed)
Patient needs medication refills sent to pharm

## 2012-12-22 ENCOUNTER — Telehealth: Payer: Self-pay

## 2012-12-22 NOTE — Telephone Encounter (Signed)
Appointment given for 7/22

## 2012-12-22 NOTE — Telephone Encounter (Signed)
Message copied by Lestine Mount on Mon Dec 22, 2012  5:44 PM ------      Message from: Maretta Bees      Created: Mon Dec 22, 2012  3:19 PM       Please schedule follow up visit in 1 week ------

## 2012-12-30 ENCOUNTER — Ambulatory Visit: Payer: No Typology Code available for payment source | Attending: Family Medicine | Admitting: Family Medicine

## 2012-12-30 VITALS — BP 117/79 | HR 79 | Temp 98.5°F | Resp 16 | Wt 256.0 lb

## 2012-12-30 DIAGNOSIS — R809 Proteinuria, unspecified: Secondary | ICD-10-CM

## 2012-12-30 DIAGNOSIS — F17200 Nicotine dependence, unspecified, uncomplicated: Secondary | ICD-10-CM

## 2012-12-30 DIAGNOSIS — Z1231 Encounter for screening mammogram for malignant neoplasm of breast: Secondary | ICD-10-CM

## 2012-12-30 DIAGNOSIS — E119 Type 2 diabetes mellitus without complications: Secondary | ICD-10-CM

## 2012-12-30 DIAGNOSIS — I10 Essential (primary) hypertension: Secondary | ICD-10-CM | POA: Insufficient documentation

## 2012-12-30 DIAGNOSIS — J3489 Other specified disorders of nose and nasal sinuses: Secondary | ICD-10-CM | POA: Insufficient documentation

## 2012-12-30 DIAGNOSIS — Z0000 Encounter for general adult medical examination without abnormal findings: Secondary | ICD-10-CM

## 2012-12-30 DIAGNOSIS — E782 Mixed hyperlipidemia: Secondary | ICD-10-CM

## 2012-12-30 DIAGNOSIS — Z1211 Encounter for screening for malignant neoplasm of colon: Secondary | ICD-10-CM | POA: Insufficient documentation

## 2012-12-30 DIAGNOSIS — D72829 Elevated white blood cell count, unspecified: Secondary | ICD-10-CM

## 2012-12-30 MED ORDER — DIGOXIN 250 MCG PO TABS: 0.2500 mg | ORAL_TABLET | Freq: Every day | ORAL | Status: DC

## 2012-12-30 MED ORDER — ATENOLOL 25 MG PO TABS: 25.0000 mg | ORAL_TABLET | Freq: Every day | ORAL | Status: DC

## 2012-12-30 MED ORDER — ALBUTEROL SULFATE HFA 108 (90 BASE) MCG/ACT IN AERS: 2.0000 | INHALATION_SPRAY | Freq: Four times a day (QID) | RESPIRATORY_TRACT | Status: DC | PRN

## 2012-12-30 MED ORDER — NICOTINE 14 MG/24HR TD PT24: 1.0000 | MEDICATED_PATCH | TRANSDERMAL | Status: DC

## 2012-12-30 MED ORDER — SITAGLIPTIN PHOSPHATE 100 MG PO TABS: 100.0000 mg | ORAL_TABLET | Freq: Every day | ORAL | Status: DC

## 2012-12-30 MED ORDER — METFORMIN HCL 1000 MG PO TABS: 1000.0000 mg | ORAL_TABLET | Freq: Two times a day (BID) | ORAL | Status: DC

## 2012-12-30 NOTE — Patient Instructions (Signed)
Blood Sugar Monitoring, Adult GLUCOSE METERS FOR SELF-MONITORING OF BLOOD GLUCOSE  It is important to be able to correctly measure your blood sugar (glucose). You can use a blood glucose monitor (a small battery-operated device) to check your glucose level at any time. This allows you and your caregiver to monitor your diabetes and to determine how well your treatment plan is working. The process of monitoring your blood glucose with a glucose meter is called self-monitoring of blood glucose (SMBG). When people with diabetes control their blood sugar, they have better health. To test for glucose with a typical glucose meter, place the disposable strip in the meter. Then place a small sample of blood on the "test strip." The test strip is coated with chemicals that combine with glucose in blood. The meter measures how much glucose is present. The meter displays the glucose level as a number. Several new models can record and store a number of test results. Some models can connect to personal computers to store test results or print them out.  Newer meters are often easier to use than older models. Some meters allow you to get blood from places other than your fingertip. Some new models have automatic timing, error codes, signals, or barcode readers to help with proper adjustment (calibration). Some meters have a large display screen or spoken instructions for people with visual impairments.  INSTRUCTIONS FOR USING GLUCOSE METERS  Wash your hands with soap and warm water, or clean the area with alcohol. Dry your hands completely.  Prick the side of your fingertip with a lancet (a sharp-pointed tool used by hand).  Hold the hand down and gently milk the finger until a small drop of blood appears. Catch the blood with the test strip.  Follow the instructions for inserting the test strip and using the SMBG meter. Most meters require the meter to be turned on and the test strip to be inserted before applying  the blood sample.  Record the test result.  Read the instructions carefully for both the meter and the test strips that go with it. Meter instructions are found in the user manual. Keep this manual to help you solve any problems that may arise. Many meters use "error codes" when there is a problem with the meter, the test strip, or the blood sample on the strip. You will need the manual to understand these error codes and fix the problem.  New devices are available such as laser lancets and meters that can test blood taken from "alternative sites" of the body, other than fingertips. However, you should use standard fingertip testing if your glucose changes rapidly. Also, use standard testing if:  You have eaten, exercised, or taken insulin in the past 2 hours.  You think your glucose is low.  You tend to not feel symptoms of low blood glucose (hypoglycemia).  You are ill or under stress.  Clean the meter as directed by the manufacturer.  Test the meter for accuracy as directed by the manufacturer.  Take your meter with you to your caregiver's office. This way, you can test your glucose in front of your caregiver to make sure you are using the meter correctly. Your caregiver can also take a sample of blood to test using a routine lab method. If values on the glucose meter are close to the lab results, you and your caregiver will see that your meter is working well and you are using good technique. Your caregiver will advise you about what   to do if the results do not match. FREQUENCY OF TESTING  Your caregiver will tell you how often you should check your blood glucose. This will depend on your type of diabetes, your current level of diabetes control, and your types of medicines. The following are general guidelines, but your care plan may be different. Record all your readings and the time of day you took them for review with your caregiver.   Diabetes type 1.  When you are using insulin  with good diabetic control (either multiple daily injections or via a pump), you should check your glucose 4 times a day.  If your diabetes is not well controlled, you may need to monitor more frequently, including before meals and 2 hours after meals, at bedtime, and occasionally between 2 a.m. and 3 a.m.  You should always check your glucose before a dose of insulin or before changing the rate on your insulin pump.  Diabetes type 2.  Guidelines for SMBG in diabetes type 2 are not as well defined.  If you are on insulin, follow the guidelines above.  If you are on medicines, but not insulin, and your glucose is not well controlled, you should test at least twice daily.  If you are not on insulin, and your diabetes is controlled with medicines or diet alone, you should test at least once daily, usually before breakfast.  A weekly profile will help your caregiver advise you on your care plan. The week before your visit, check your glucose before a meal and 2 hours after a meal at least daily. You may want to test before and after a different meal each day so you and your caregiver can tell how well controlled your blood sugars are throughout the course of a 24 hour period.  Gestational diabetes (diabetes during pregnancy).  Frequent testing is often necessary. Accurate timing is important.  If you are not on insulin, check your glucose 4 times a day. Check it before breakfast and 1 hour after the start of each meal.  If you are on insulin, check your glucose 6 times a day. Check it before each meal and 1 hour after the first bite of each meal.  General guidelines.  More frequent testing is required at the start of insulin treatment. Your caregiver will instruct you.  Test your glucose any time you suspect you have low blood sugar (hypoglycemia).  You should test more often when you change medicines, when you have unusual stress or illness, or in other unusual circumstances. OTHER  THINGS TO KNOW ABOUT GLUCOSE METERS  Measurement Range. Most glucose meters are able to read glucose levels over a broad range of values from as low as 0 to as high as 600 mg/dL. If you get an extremely high or low reading from your meter, you should first confirm it with another reading. Report very high or very low readings to your caregiver.  Whole Blood Glucose versus Plasma Glucose. Some older home glucose meters measure glucose in your whole blood. In a lab or when using some newer home glucose meters, the glucose is measured in your plasma (one component of blood). The difference can be important. It is important for you and your caregiver to know whether your meter gives its results as "whole blood equivalent" or "plasma equivalent."  Display of High and Low Glucose Values. Part of learning how to operate a meter is understanding what the meter results mean. Know how high and low glucose concentrations are displayed  on your meter.  Factors that Affect Glucose Meter Performance. The accuracy of your test results depends on many factors and varies depending on the brand and type of meter. These factors include:  Low red blood cell count (anemia).  Substances in your blood (such as uric acid, vitamin C, and others).  Environmental factors (temperature, humidity, altitude).  Name-brand versus generic test strips.  Calibration. Make sure your meter is set up properly. It is a good idea to do a calibration test with a control solution recommended by the manufacturer of your meter whenever you begin using a fresh bottle of test strips. This will help verify the accuracy of your meter.  Improperly stored, expired, or defective test strips. Keep your strips in a dry place with the lid on.  Soiled meter.  Inadequate blood sample. NEW TECHNOLOGIES FOR GLUCOSE TESTING Alternative site testing Some glucose meters allow testing blood from alternative sites. These include the:  Upper  arm.  Forearm.  Base of the thumb.  Thigh. Sampling blood from alternative sites may be desirable. However, it may have some limitations. Blood in the fingertips show changes in glucose levels more quickly than blood in other parts of the body. This means that alternative site test results may be different from fingertip test results, not because of the meter's ability to test accurately, but because the actual glucose concentration can be different.  Continuous Glucose Monitoring Devices to measure your blood glucose continuously are available, and others are in development. These methods can be more expensive than self-monitoring with a glucose meter. However, it is uncertain how effective and reliable these devices are. Your caregiver will advise you if this approach makes sense for you. IF BLOOD SUGARS ARE CONTROLLED, PEOPLE WITH DIABETES REMAIN HEALTHIER.  SMBG is an important part of the treatment plan of patients with diabetes mellitus. Below are reasons for using SMBG:   It confirms that your glucose is at a specific, healthy level.  It detects hypoglycemia and severe hyperglycemia.  It allows you and your caregiver to make adjustments in response to changes in lifestyle for individuals requiring medicine.  It determines the need for starting insulin therapy in temporary diabetes that happens during pregnancy (gestational diabetes). Document Released: 05/31/2003 Document Revised: 08/20/2011 Document Reviewed: 09/21/2010 [facility] Patient Information 2014 [place], [place]. Smoking Cessation, Tips for Success YOU CAN QUIT SMOKING If you are ready to quit smoking, congratulations! You have chosen to help yourself be healthier. Cigarettes bring nicotine, tar, carbon monoxide, and other irritants into your body. Your lungs, heart, and blood vessels will be able to work better without these poisons. There are many different ways to quit smoking. Nicotine gum, nicotine patches, a nicotine  inhaler, or nicotine nasal spray can help with physical craving. Hypnosis, support groups, and medicines help break the habit of smoking. Here are some tips to help you quit for good.  Throw away all cigarettes.  Clean and remove all ashtrays from your home, work, and car.  On a card, write down your reasons for quitting. Carry the card with you and read it when you get the urge to smoke.  Cleanse your body of nicotine. Drink enough water and fluids to keep your urine clear or pale yellow. Do this after quitting to flush the nicotine from your body.  Learn to predict your moods. Do not let a bad situation be your excuse to have a cigarette. Some situations in your life might tempt you into wanting a cigarette.  Never have "  just one" cigarette. It leads to wanting another and another. Remind yourself of your decision to quit.  Change habits associated with smoking. If you smoked while driving or when feeling stressed, try other activities to replace smoking. Stand up when drinking your coffee. Brush your teeth after eating. Sit in a different chair when you read the paper. Avoid alcohol while trying to quit, and try to drink fewer caffeinated beverages. Alcohol and caffeine may urge you to smoke.  Avoid foods and drinks that can trigger a desire to smoke, such as sugary or spicy foods and alcohol.  Ask people who smoke not to smoke around you.  Have something planned to do right after eating or having a cup of coffee. Take a walk or exercise to perk you up. This will help to keep you from overeating.  Try a relaxation exercise to calm you down and decrease your stress. Remember, you may be tense and nervous for the first 2 weeks after you quit, but this will pass.  Find new activities to keep your hands busy. Play with a pen, coin, or rubber band. Doodle or draw things on paper.  Brush your teeth right after eating. This will help cut down on the craving for the taste of tobacco after meals.  You can try mouthwash, too.  Use oral substitutes, such as lemon drops, carrots, a cinnamon stick, or chewing gum, in place of cigarettes. Keep them handy so they are available when you have the urge to smoke.  When you have the urge to smoke, try deep breathing.  Designate your home as a nonsmoking area.  If you are a heavy smoker, ask your caregiver about a prescription for nicotine chewing gum. It can ease your withdrawal from nicotine.  Reward yourself. Set aside the cigarette money you save and buy yourself something nice.  Look for support from others. Join a support group or smoking cessation program. Ask someone at home or at work to help you with your plan to quit smoking.  Always ask yourself, "Do I need this cigarette or is this just a reflex?" Tell yourself, "Today, I choose not to smoke," or "I do not want to smoke." You are reminding yourself of your decision to quit, even if you do smoke a cigarette. HOW WILL I FEEL WHEN I QUIT SMOKING?  The benefits of not smoking start within days of quitting.  You may have symptoms of withdrawal because your body is used to nicotine (the addictive substance in cigarettes). You may crave cigarettes, be irritable, feel very hungry, cough often, get headaches, or have difficulty concentrating.  The withdrawal symptoms are only temporary. They are strongest when you first quit but will go away within 10 to 14 days.  When withdrawal symptoms occur, stay in control. Think about your reasons for quitting. Remind yourself that these are signs that your body is healing and getting used to being without cigarettes.  Remember that withdrawal symptoms are easier to treat than the major diseases that smoking can cause.  Even after the withdrawal is over, expect periodic urges to smoke. However, these cravings are generally short-lived and will go away whether you smoke or not. Do not smoke!  If you relapse and smoke again, do not lose hope. Most  smokers quit 3 times before they are successful.  If you relapse, do not give up! Plan ahead and think about what you will do the next time you get the urge to smoke. LIFE AS A NONSMOKER:  MAKE IT FOR A MONTH, MAKE IT FOR LIFE Day 1: Hang this page where you will see it every day. Day 2: Get rid of all ashtrays, matches, and lighters. Day 3: Drink water. Breathe deeply between sips. Day 4: Avoid places with smoke-filled air, such as bars, clubs, or the smoking section of restaurants. Day 5: Keep track of how much money you save by not smoking. Day 6: Avoid boredom. Keep a good book with you or go to the movies. Day 7: Reward yourself! One week without smoking! Day 8: Make a dental appointment to get your teeth cleaned. Day 9: Decide how you will turn down a cigarette before it is offered to you. Day 10: Review your reasons for quitting. Day 11: Distract yourself. Stay active to keep your mind off smoking and to relieve tension. Take a walk, exercise, read a book, do a crossword puzzle, or try a new hobby. Day 12: Exercise. Get off the bus before your stop or use stairs instead of escalators. Day 13: Call on friends for support and encouragement. Day 14: Reward yourself! Two weeks without smoking! Day 15: Practice deep breathing exercises. Day 16: Bet a friend that you can stay a nonsmoker. Day 17: Ask to sit in nonsmoking sections of restaurants. Day 18: Hang up "No Smoking" signs. Day 19: Think of yourself as a nonsmoker. Day 20: Each morning, tell yourself you will not smoke. Day 21: Reward yourself! Three weeks without smoking! Day 22: Think of smoking in negative ways. Remember how it stains your teeth, gives you bad breath, and leaves you short of breath. Day 23: Eat a nutritious breakfast. Day 24:Do not relive your days as a smoker. Day 25: Hold a pencil in your hand when talking on the telephone. Day 26: Tell all your friends you do not smoke. Day 27: Think about how much better  food tastes. Day 28: Remember, one cigarette is one too many. Day 29: Take up a hobby that will keep your hands busy. Day 30: Congratulations! One month without smoking! Give yourself a big reward. Your caregiver can direct you to community resources or hospitals for support, which may include:  Group support.  Education.  Hypnosis.  Subliminal therapy. Document Released: 02/24/2004 Document Revised: 08/20/2011 Document Reviewed: 03/14/2009 [facility] Patient Information 2014 [place], [place].

## 2012-12-30 NOTE — Progress Notes (Signed)
Patient complains of nasal and chest congestion past week

## 2012-12-30 NOTE — Progress Notes (Signed)
Patient ID: Breanna Berger, female   DOB: January 08, 1961, 52 y.o.   MRN: [id number]  CC: nasal congestion  HPI: Patient complains of nasal and chest congestion past week.  She says that it is getting much better. She has been using her albuterol inhaler and that has helped her symptoms of wheezing which has resolved now.  Pt wanting to discuss her results from labs done to reveal persistent leukocytosis.  No fever or chills.  No nausea or vomiting.  Pt reports that she has been working hard to change her diet and get her DM under better control.   Allergies  Allergen Reactions  . Doxycycline Hyclate     REACTION: rash   Past Medical History  Diagnosis Date  . Diabetes mellitus   . Hypertension   . MVP (mitral valve prolapse)    Current Outpatient Prescriptions on File Prior to Visit  Medication Sig Dispense Refill  . fluticasone (FLONASE) 50 MCG/ACT nasal spray Place 2 sprays into the nose daily.  1 g  2  . loratadine (CLARITIN) 10 MG tablet Take 1 tablet (10 mg total) by mouth daily as needed for allergies.  30 tablet  11  . [DISCONTINUED] cetirizine (ZYRTEC) 10 MG tablet Take 1 tablet (10 mg total) by mouth daily.  30 tablet  4   No current facility-administered medications on file prior to visit.   History reviewed. No pertinent family history. History   Social History  . Marital Status: Single    Spouse Name: N/A    Number of Children: N/A  . Years of Education: N/A   Occupational History  . Not on file.   Social History Main Topics  . Smoking status: Current Every Day Smoker  . Smokeless tobacco: Not on file  . Alcohol Use: No  . Drug Use: No  . Sexually Active:    Other Topics Concern  . Not on file   Social History Narrative  . No narrative on file    Review of Systems  Constitutional: Negative for fever, chills, diaphoresis, activity change, appetite change and fatigue.  HENT: Negative for ear pain, nosebleeds, congestion, facial swelling, rhinorrhea, neck  pain, neck stiffness and ear discharge.   Eyes: Negative for pain, discharge, redness, itching and visual disturbance.  Respiratory: Negative for cough, choking, chest tightness, shortness of breath, wheezing and stridor.   Cardiovascular: Negative for chest pain, palpitations and leg swelling.  Gastrointestinal: Negative for abdominal distention.  Genitourinary: Negative for dysuria, urgency, frequency, hematuria, flank pain, decreased urine volume, difficulty urinating and dyspareunia.  Musculoskeletal: Negative for back pain, joint swelling, arthralgias and gait problem.  Neurological: Negative for dizziness, tremors, seizures, syncope, facial asymmetry, speech difficulty, weakness, light-headedness, numbness and headaches.  Hematological: Negative for adenopathy. Does not bruise/bleed easily.  Psychiatric/Behavioral: Negative for hallucinations, behavioral problems, confusion, dysphoric mood, decreased concentration and agitation.    Objective:   Filed Vitals:   12/30/12 1011  BP: 117/79  Pulse: 79  Temp: 98.5 F (36.9 C)  Resp: 16    Physical Exam  Constitutional: Appears well-developed and well-nourished. No distress.  HENT: Normocephalic. External right and left ear normal. Oropharynx is clear and moist.  swollen nasal turbinates bilateral. Eyes: Conjunctivae and EOM are normal. PERRLA, no scleral icterus.  Neck: Normal ROM. Neck supple. No JVD. No tracheal deviation. No thyromegaly.  CVS: RRR, S1/S2 +, no murmurs, no gallops, no carotid bruit.  Pulmonary: Effort and breath sounds normal, no stridor, rhonchi, wheezes, rales.  Abdominal: Soft. BS +,  no distension, tenderness, rebound or guarding.  Musculoskeletal: Normal range of motion. No edema and no tenderness.  Lymphadenopathy: No lymphadenopathy noted, cervical, inguinal. Neuro: Alert. Normal reflexes, muscle tone coordination. No cranial nerve deficit. Skin: Skin is warm and dry. No rash noted. Not diaphoretic. No  erythema. No pallor.  Psychiatric: Normal mood and affect. Behavior, judgment, thought content normal.   Lab Results  Component Value Date   WBC 12.1* 11/26/2012   HGB 12.7 11/26/2012   HCT 39.7 11/26/2012   MCV 72.6* 11/26/2012   PLT 251 11/26/2012   Lab Results  Component Value Date   CREATININE 0.58 05/05/2012   BUN 8 05/05/2012   NA 136 05/05/2012   K 4.1 05/05/2012   CL 99 05/05/2012   CO2 29 05/05/2012    Lab Results  Component Value Date   HGBA1C 8.8 11/21/2012   Lipid Panel     Component Value Date/Time   CHOL 206* 11/26/2012 1044   TRIG 328* 11/26/2012 1044   HDL 40 11/26/2012 1044   CHOLHDL 5.2 11/26/2012 1044   VLDL 66* 11/26/2012 1044   LDLCALC 100* 11/26/2012 1044       Assessment and plan:   Patient Active Problem List   Diagnosis Date Noted  . FATIGUE 03/31/2010  . PLANTAR FASCIITIS, BILATERAL 01/06/2010  . EDEMA 01/06/2010  . LEUKOCYTOSIS, CHRONIC 11/25/2009  . TOBACCO ABUSE 11/25/2009  . CONSTIPATION 11/25/2009  . PELVIC  PAIN 11/25/2009  . ALLERGIC RHINITIS, SEASONAL 05/12/2009  . GASTROENTERITIS, ACUTE 07/06/2008  . MICROALBUMINURIA 10/21/2007  . ABDOMINAL BLOATING 03/28/2007  . DYSTHYMIA, SITUATIONAL 03/18/2007  . FREQUENCY, URINARY 03/18/2007  . DM, UNCOMPLICATED, TYPE II 11/19/2006  . HYPERPLASIA, PERSISTENT THYMUS 11/19/2006  . MITRAL VALVE PROLAPSE 11/19/2006  . PSVT 11/19/2006  . Nonspecific (abnormal) findings on radiological and other examination of body structure 10/31/2006  . CHEST XRAY, ABNORMAL 10/31/2006  . HYPERLIPIDEMIA, MIXED 08/23/2006  . ANEMIA, IRON DEFICIENCY, MICROCYTIC 08/23/2006   Diabetes - Plan: metFORMIN (GLUCOPHAGE) 1000 MG tablet  TOBACCO ABUSE  MICROALBUMINURIA  LEUKOCYTOSIS, CHRONIC  HYPERLIPIDEMIA, MIXED  DM, UNCOMPLICATED, TYPE II  Add Januvia 100 mg po daily  Refilled regular medications today.  Reviewed labs with patient today.   Pt was referred to hematology for eval of chronic  leukocytosis  Continue blood sugar monitoring.   Follow up in 4 months  The patient was given clear instructions to go to ER or return to medical center if symptoms don't improve, worsen or new problems develop.  The patient verbalized understanding.  The patient was told to call to get any lab results if not heard anything in the next week.    Rodney Langton, MD, CDE, FAAFP [facility] [place], [place]

## 2013-01-02 ENCOUNTER — Telehealth: Payer: Self-pay

## 2013-01-02 NOTE — Telephone Encounter (Signed)
Patient's short-term disability forms in Dr. Cain Saupe box to be completed

## 2013-01-05 ENCOUNTER — Ambulatory Visit: Payer: No Typology Code available for payment source | Admitting: Home Health Services

## 2013-01-14 ENCOUNTER — Telehealth: Payer: Self-pay | Admitting: Radiology

## 2013-01-14 NOTE — Telephone Encounter (Signed)
Forms given to Amy in my box, unsure if this is WC. Patient not seen here in epic.

## 2013-01-15 ENCOUNTER — Telehealth: Payer: Self-pay

## 2013-01-15 NOTE — Telephone Encounter (Signed)
Patient would like to get a blood test for type and screen Does she need to be seen or can you pur the order in? If so can you put the order in

## 2013-01-15 NOTE — Addendum Note (Signed)
Addended by: Cathren Harsh on: 01/15/2013 04:23 PM   Modules accepted: Orders

## 2013-01-21 ENCOUNTER — Ambulatory Visit (INDEPENDENT_AMBULATORY_CARE_PROVIDER_SITE_OTHER): Payer: No Typology Code available for payment source | Admitting: Home Health Services

## 2013-01-21 ENCOUNTER — Other Ambulatory Visit: Payer: Self-pay | Admitting: Family Medicine

## 2013-01-21 ENCOUNTER — Telehealth: Payer: Self-pay | Admitting: Hematology and Oncology

## 2013-01-21 ENCOUNTER — Ambulatory Visit: Payer: No Typology Code available for payment source | Attending: Family Medicine

## 2013-01-21 DIAGNOSIS — E119 Type 2 diabetes mellitus without complications: Secondary | ICD-10-CM

## 2013-01-21 DIAGNOSIS — D508 Other iron deficiency anemias: Secondary | ICD-10-CM

## 2013-01-21 DIAGNOSIS — D72829 Elevated white blood cell count, unspecified: Secondary | ICD-10-CM

## 2013-01-21 NOTE — Progress Notes (Signed)
Pt presented for retinal scan as part of DM annual maintenance.   Scan was completed and submitted for evaluation.  Informed patient that results would be sent to their PCP's office in 1-2 weeks.  Pt expressed understanding.

## 2013-01-21 NOTE — Telephone Encounter (Signed)
S/W PT IN RE TO NP APPT 09/04 @ 10:30 W/DR.VICTOR W[employer]E PACKET MAILED DX- LEUKOCYTOSIS

## 2013-01-22 LAB — ABO AND RH: Rh Type: POSITIVE

## 2013-01-22 NOTE — Progress Notes (Signed)
Quick Note:  Please inform patient that her blood type is O positive.  Rodney Langton, MD, CDE, FAAFP [facility] [place], [place]   ______

## 2013-01-23 ENCOUNTER — Ambulatory Visit: Payer: No Typology Code available for payment source

## 2013-01-23 ENCOUNTER — Telehealth: Payer: Self-pay | Admitting: *Deleted

## 2013-01-23 NOTE — Telephone Encounter (Signed)
01/23/13 Patient made aware that her blood type is O positive per Dr. Laural Benes. P.Dashae Wilcher,RN BSN [facility]

## 2013-01-24 ENCOUNTER — Emergency Department (HOSPITAL_BASED_OUTPATIENT_CLINIC_OR_DEPARTMENT_OTHER)
Admission: EM | Admit: 2013-01-24 | Discharge: 2013-01-24 | Disposition: A | Discharge status: Home / Self Care | Payer: Self-pay | Attending: Emergency Medicine | Admitting: Emergency Medicine

## 2013-01-24 ENCOUNTER — Encounter (HOSPITAL_BASED_OUTPATIENT_CLINIC_OR_DEPARTMENT_OTHER): Payer: Self-pay | Admitting: *Deleted

## 2013-01-24 DIAGNOSIS — E119 Type 2 diabetes mellitus without complications: Secondary | ICD-10-CM | POA: Insufficient documentation

## 2013-01-24 DIAGNOSIS — M5412 Radiculopathy, cervical region: Secondary | ICD-10-CM | POA: Insufficient documentation

## 2013-01-24 DIAGNOSIS — M542 Cervicalgia: Secondary | ICD-10-CM | POA: Insufficient documentation

## 2013-01-24 DIAGNOSIS — Z87891 Personal history of nicotine dependence: Secondary | ICD-10-CM | POA: Insufficient documentation

## 2013-01-24 DIAGNOSIS — IMO0001 Reserved for inherently not codable concepts without codable children: Secondary | ICD-10-CM | POA: Insufficient documentation

## 2013-01-24 DIAGNOSIS — IMO0002 Reserved for concepts with insufficient information to code with codable children: Secondary | ICD-10-CM | POA: Insufficient documentation

## 2013-01-24 DIAGNOSIS — M2550 Pain in unspecified joint: Secondary | ICD-10-CM | POA: Insufficient documentation

## 2013-01-24 DIAGNOSIS — Z79899 Other long term (current) drug therapy: Secondary | ICD-10-CM | POA: Insufficient documentation

## 2013-01-24 DIAGNOSIS — R209 Unspecified disturbances of skin sensation: Secondary | ICD-10-CM | POA: Insufficient documentation

## 2013-01-24 DIAGNOSIS — Z8679 Personal history of other diseases of the circulatory system: Secondary | ICD-10-CM | POA: Insufficient documentation

## 2013-01-24 MED ORDER — DIAZEPAM 5 MG PO TABS: 5.0000 mg | ORAL_TABLET | Freq: Two times a day (BID) | ORAL | Status: DC

## 2013-01-24 MED ORDER — OXYCODONE-ACETAMINOPHEN 5-325 MG PO TABS: 2.0000 | ORAL_TABLET | ORAL | Status: DC | PRN

## 2013-01-24 NOTE — ED Provider Notes (Signed)
Medical screening examination/treatment/procedure(s) were performed by non-physician practitioner and as supervising physician I was immediately available for consultation/collaboration.   Charles B. Sheldon, MD 01/24/13 2049 

## 2013-01-24 NOTE — ED Notes (Addendum)
Patient fell at her job about a month ago and ever since then she has been experiencing neck, right shoulder, and lower back pain.  Pain seems to be getting worse.  Pain radiates from neck to right shoulder and fingers at times.  Patient took aleve about two hours ago for pain

## 2013-01-24 NOTE — ED Provider Notes (Signed)
CSN: [id number]     Arrival date & time 01/24/13  1408 History     First MD Initiated Contact with Patient 01/24/13 1444     Chief Complaint  Patient presents with  . Shoulder Pain   (Consider location/radiation/quality/duration/timing/severity/associated sxs/prior Treatment) HPI Comments: Patient here after stating that she fell at work on her right shoulder about 1 month ago.  She states that she is being followed by an occupational MD who has now referred her to a neurosurgeon.  She has an appointment with them on the 29th of this month.  She reports continued pain in right neck, radiating to shoulder.  She reports episodic numbness and subjective weakness.  Denies loss of functionality.  No numbness at this time.  She is out of her pain medication currently.  Patient is a 52 y.o. female presenting with shoulder pain. The history is provided by the patient. No language interpreter was used.  Shoulder Pain This is a recurrent problem. The current episode started 1 to 4 weeks ago. The problem occurs constantly. The problem has been gradually worsening. Associated symptoms include arthralgias, myalgias, neck pain and numbness. Pertinent negatives include no abdominal pain, anorexia, chest pain, chills, coughing, fatigue, fever, joint swelling, nausea, rash, swollen glands, urinary symptoms, vertigo, vomiting or weakness. Nothing aggravates the symptoms. She has tried nothing for the symptoms. The treatment provided no relief.    Past Medical History  Diagnosis Date  . Diabetes mellitus   . MVP (mitral valve prolapse)    Past Surgical History  Procedure Laterality Date  . Hernia repair    . Cholecystectomy    . Cesarean section     History reviewed. No pertinent family history. History  Substance Use Topics  . Smoking status: Former Games developer  . Smokeless tobacco: Not on file  . Alcohol Use: No   OB History   Grav Para Term Preterm Abortions TAB SAB Ect Mult Living                  Review of Systems  Constitutional: Negative for fever, chills and fatigue.  HENT: Positive for neck pain.   Respiratory: Negative for cough.   Cardiovascular: Negative for chest pain.  Gastrointestinal: Negative for nausea, vomiting, abdominal pain and anorexia.  Musculoskeletal: Positive for myalgias and arthralgias. Negative for joint swelling.  Skin: Negative for rash.  Neurological: Positive for numbness. Negative for vertigo and weakness.  All other systems reviewed and are negative.    Allergies  Doxycycline hyclate  Home Medications   Current Outpatient Rx  Name  Route  Sig  Dispense  Refill  . atenolol (TENORMIN) 25 MG tablet   Oral   Take 1 tablet (25 mg total) by mouth daily.   30 tablet   4   . digoxin (LANOXIN) 0.25 MG tablet   Oral   Take 1 tablet (0.25 mg total) by mouth daily.   30 tablet   3   . metFORMIN (GLUCOPHAGE) 1000 MG tablet   Oral   Take 1 tablet (1,000 mg total) by mouth 2 (two) times daily with a meal.   60 tablet   4   . albuterol (PROVENTIL HFA;VENTOLIN HFA) 108 (90 BASE) MCG/ACT inhaler   Inhalation   Inhale 2 puffs into the lungs every 6 (six) hours as needed for wheezing.   1 Inhaler   5   . fluticasone (FLONASE) 50 MCG/ACT nasal spray   Nasal   Place 2 sprays into the nose daily.  1 g   2   . loratadine (CLARITIN) 10 MG tablet   Oral   Take 1 tablet (10 mg total) by mouth daily as needed for allergies.   30 tablet   11   . nicotine (NICODERM CQ) 14 mg/24hr patch   Transdermal   Place 1 patch onto the skin daily.   28 patch   1   . sitaGLIPtin (JANUVIA) 100 MG tablet   Oral   Take 1 tablet (100 mg total) by mouth daily.   30 tablet   4    BP 142/64  Pulse 64  Temp(Src) 98.1 F (36.7 C) (Oral)  Resp 20  Ht 5\' 8"  (1.727 m)  Wt 250 lb (113.399 kg)  BMI 38.02 kg/m2  SpO2 100% Physical Exam  Nursing note and vitals reviewed. Constitutional: She is oriented to person, place, and time. She appears  well-developed and well-nourished. No distress.  HENT:  Head: Normocephalic and atraumatic.  Right Ear: External ear normal.  Left Ear: External ear normal.  Nose: Nose normal.  Mouth/Throat: Oropharynx is clear and moist. No oropharyngeal exudate.  Eyes: Conjunctivae are normal. Pupils are equal, round, and reactive to light. No scleral icterus.  Neck: Normal range of motion. Neck supple.  Cardiovascular: Normal rate, regular rhythm and normal heart sounds.  Exam reveals no gallop and no friction rub.   No murmur heard. Pulmonary/Chest: Effort normal and breath sounds normal. No respiratory distress. She has no wheezes.  Abdominal: Soft. Bowel sounds are normal. She exhibits no distension. There is no tenderness.  Musculoskeletal:       Right shoulder: She exhibits decreased range of motion, tenderness, pain and spasm.       Cervical back: She exhibits decreased range of motion, tenderness and spasm. She exhibits no bony tenderness and no deformity.       Back:  Pain with abduction to 90 degrees, unable to fully abduct with active or passive.  Grip strength normal, 5/5 deltoid 5/5 biceps and 5/5 triceps, sensation normal at this time.  Lymphadenopathy:    She has no cervical adenopathy.  Neurological: She is alert and oriented to person, place, and time. She displays normal reflexes. No cranial nerve deficit.  Skin: Skin is warm and dry. No rash noted. No erythema. No pallor.  Psychiatric: She has a normal mood and affect. Her behavior is normal. Judgment and thought content normal.    ED Course   Procedures (including critical care time)  Labs Reviewed - No data to display No results found. No diagnosis found.  Cervical Radiculopathy  MDM  Patient already with referral to [facility] - no emergent indication for MRI now.  Plan to give short course of pain control.  She will follow up with occupational med MD on Monday for MRI requesti  Scarlette Calico C. Marisue Humble, PA-C 01/24/13 1519

## 2013-01-27 ENCOUNTER — Telehealth: Payer: Self-pay

## 2013-01-27 NOTE — Telephone Encounter (Signed)
C/D 01/27/13 for appt. 02/12/13

## 2013-01-30 ENCOUNTER — Ambulatory Visit (HOSPITAL_COMMUNITY): Payer: No Typology Code available for payment source

## 2013-01-30 ENCOUNTER — Ambulatory Visit (HOSPITAL_COMMUNITY)
Admission: RE | Admit: 2013-01-30 | Discharge: 2013-01-30 | Disposition: A | Discharge status: Home / Self Care | Payer: No Typology Code available for payment source | Source: Ambulatory Visit | Attending: Obstetrics and Gynecology | Admitting: Obstetrics and Gynecology

## 2013-01-30 ENCOUNTER — Ambulatory Visit (INDEPENDENT_AMBULATORY_CARE_PROVIDER_SITE_OTHER): Payer: No Typology Code available for payment source | Admitting: Obstetrics and Gynecology

## 2013-01-30 ENCOUNTER — Encounter: Payer: Self-pay | Admitting: Obstetrics and Gynecology

## 2013-01-30 VITALS — BP 123/78 | HR 72 | Temp 97.6°F | Ht 68.0 in | Wt 255.0 lb

## 2013-01-30 DIAGNOSIS — N950 Postmenopausal bleeding: Secondary | ICD-10-CM

## 2013-01-30 DIAGNOSIS — Z01419 Encounter for gynecological examination (general) (routine) without abnormal findings: Secondary | ICD-10-CM

## 2013-01-30 DIAGNOSIS — E669 Obesity, unspecified: Secondary | ICD-10-CM | POA: Insufficient documentation

## 2013-01-30 DIAGNOSIS — D259 Leiomyoma of uterus, unspecified: Secondary | ICD-10-CM | POA: Insufficient documentation

## 2013-01-30 NOTE — Progress Notes (Signed)
  Subjective:     Breanna Berger is a 52 y.o. female [phone] with BMI 38 who is here for a comprehensive physical exam. The patient reports absence of menses for over a year but experienced a 5-day period 2 weeks ago. Patient describes the bleeding as a normal period, heavy initially but reduced to spotting. Patient reports this was the first time this has happened to her. She has known fibroid uterus. Patient sexually active with new partner. Reports severe dysuria the first time but has since resolved.  History   Social History  . Marital Status: Single    Spouse Name: N/A    Number of Children: N/A  . Years of Education: N/A   Occupational History  . Not on file.   Social History Main Topics  . Smoking status: Former Games developer  . Smokeless tobacco: Never Used  . Alcohol Use: No  . Drug Use: No  . Sexual Activity: Yes   Other Topics Concern  . Not on file   Social History Narrative  . No narrative on file   Health Maintenance  Topic Date Due  . Pap Smear  10/21/2010  . Influenza Vaccine  02/09/2013  . Tetanus/tdap  06/11/2013   Past Medical History  Diagnosis Date  . Diabetes mellitus   . MVP (mitral valve prolapse)    Past Surgical History  Procedure Laterality Date  . Hernia repair    . Cholecystectomy    . Cesarean section     History reviewed. No pertinent family history. History  Substance Use Topics  . Smoking status: Former Games developer  . Smokeless tobacco: Never Used  . Alcohol Use: No       Review of Systems A comprehensive review of systems was negative.   Objective:      GENERAL: Well-developed, well-nourished female in no acute distress.  HEENT: Normocephalic, atraumatic. Sclerae anicteric.  NECK: Supple. Normal thyroid.  LUNGS: Clear to auscultation bilaterally.  HEART: Regular rate and rhythm. BREASTS: Symmetric in size. No palpable masses or lymphadenopathy, skin changes, or nipple drainage. ABDOMEN: Soft, nontender, nondistended. No  organomegaly. PELVIC: Normal external female genitalia. Vagina is pink and rugated.  Normal discharge. Normal appearing cervix. Bimanual exam limited secondary to body habitus. EXTREMITIES: No cyanosis, clubbing, or edema, 2+ distal pulses.     Assessment:    Healthy female exam.      Plan:    pap smear collected Pelvic ultrasound ordered Patient scheduled for mammography next Friday Discussed need for endometrial biopsy but patient declined at this time. She understands that it is abnormal to experience vaginal bleeding in the postmenopausal state. She would like [place] to obtain records from [facility] before performing the biopsy rtc in 2 weeks to review ultrasound results and endometrial biopsy  See After Visit Summary for Counseling Recommendations

## 2013-01-30 NOTE — Patient Instructions (Signed)
Preventive Care for Adults, Female A healthy lifestyle and preventive care can promote health and wellness. Preventive health guidelines for women include the following key practices.  A routine yearly physical is a good way to check with your caregiver about your health and preventive screening. It is a chance to share any concerns and updates on your health, and to receive a thorough exam.  Visit your dentist for a routine exam and preventive care every 6 months. Brush your teeth twice a day and floss once a day. Good oral hygiene prevents tooth decay and gum disease.  The frequency of eye exams is based on your age, health, family medical history, use of contact lenses, and other factors. Follow your caregiver's recommendations for frequency of eye exams.  Eat a healthy diet. Foods like vegetables, fruits, whole grains, low-fat dairy products, and lean protein foods contain the nutrients you need without too many calories. Decrease your intake of foods high in solid fats, added sugars, and salt. Eat the right amount of calories for you.Get information about a proper diet from your caregiver, if necessary.  Regular physical exercise is one of the most important things you can do for your health. Most adults should get at least 150 minutes of moderate-intensity exercise (any activity that increases your heart rate and causes you to sweat) each week. In addition, most adults need muscle-strengthening exercises on 2 or more days a week.  Maintain a healthy weight. The body mass index (BMI) is a screening tool to identify possible weight problems. It provides an estimate of body fat based on height and weight. Your caregiver can help determine your BMI, and can help you achieve or maintain a healthy weight.For adults 20 years and older:  A BMI below 18.5 is considered underweight.  A BMI of 18.5 to 24.9 is normal.  A BMI of 25 to 29.9 is considered overweight.  A BMI of 30 and above is  considered obese.  Maintain normal blood lipids and cholesterol levels by exercising and minimizing your intake of saturated fat. Eat a balanced diet with plenty of fruit and vegetables. Blood tests for lipids and cholesterol should begin at age 20 and be repeated every 5 years. If your lipid or cholesterol levels are high, you are over 50, or you are at high risk for heart disease, you may need your cholesterol levels checked more frequently.Ongoing high lipid and cholesterol levels should be treated with medicines if diet and exercise are not effective.  If you smoke, find out from your caregiver how to quit. If you do not use tobacco, do not start.  If you are pregnant, do not drink alcohol. If you are breastfeeding, be very cautious about drinking alcohol. If you are not pregnant and choose to drink alcohol, do not exceed 1 drink per day. One drink is considered to be 12 ounces (355 mL) of beer, 5 ounces (148 mL) of wine, or 1.5 ounces (44 mL) of liquor.  Avoid use of street drugs. Do not share needles with anyone. Ask for help if you need support or instructions about stopping the use of drugs.  High blood pressure causes heart disease and increases the risk of stroke. Your blood pressure should be checked at least every 1 to 2 years. Ongoing high blood pressure should be treated with medicines if weight loss and exercise are not effective.  If you are 55 to 50 years old, ask your caregiver if you should take aspirin to prevent strokes.  Diabetes   screening involves taking a blood sample to check your fasting blood sugar level. This should be done once every 3 years, after age 45, if you are within normal weight and without risk factors for diabetes. Testing should be considered at a younger age or be carried out more frequently if you are overweight and have at least 1 risk factor for diabetes.  Breast cancer screening is essential preventive care for women. You should practice "breast  self-awareness." This means understanding the normal appearance and feel of your breasts and may include breast self-examination. Any changes detected, no matter how small, should be reported to a caregiver. Women in their 20s and 30s should have a clinical breast exam (CBE) by a caregiver as part of a regular health exam every 1 to 3 years. After age 40, women should have a CBE every year. Starting at age 40, women should consider having a mammography (breast X-ray test) every year. Women who have a family history of breast cancer should talk to their caregiver about genetic screening. Women at a high risk of breast cancer should talk to their caregivers about having magnetic resonance imaging (MRI) and a mammography every year.  The Pap test is a screening test for cervical cancer. A Pap test can show cell changes on the cervix that might become cervical cancer if left untreated. A Pap test is a procedure in which cells are obtained and examined from the lower end of the uterus (cervix).  Women should have a Pap test starting at age 21.  Between ages 21 and 29, Pap tests should be repeated every 2 years.  Beginning at age 30, you should have a Pap test every 3 years as long as the past 3 Pap tests have been normal.  Some women have medical problems that increase the chance of getting cervical cancer. Talk to your caregiver about these problems. It is especially important to talk to your caregiver if a new problem develops soon after your last Pap test. In these cases, your caregiver may recommend more frequent screening and Pap tests.  The above recommendations are the same for women who have or have not gotten the vaccine for human papillomavirus (HPV).  If you had a hysterectomy for a problem that was not cancer or a condition that could lead to cancer, then you no longer need Pap tests. Even if you no longer need a Pap test, a regular exam is a good idea to make sure no other problems are  starting.  If you are between ages 65 and 70, and you have had normal Pap tests going back 10 years, you no longer need Pap tests. Even if you no longer need a Pap test, a regular exam is a good idea to make sure no other problems are starting.  If you have had past treatment for cervical cancer or a condition that could lead to cancer, you need Pap tests and screening for cancer for at least 20 years after your treatment.  If Pap tests have been discontinued, risk factors (such as a new sexual partner) need to be reassessed to determine if screening should be resumed.  The HPV test is an additional test that may be used for cervical cancer screening. The HPV test looks for the virus that can cause the cell changes on the cervix. The cells collected during the Pap test can be tested for HPV. The HPV test could be used to screen women aged 30 years and older, and should   be used in women of any age who have unclear Pap test results. After the age of 30, women should have HPV testing at the same frequency as a Pap test.  Colorectal cancer can be detected and often prevented. Most routine colorectal cancer screening begins at the age of 50 and continues through age 75. However, your caregiver may recommend screening at an earlier age if you have risk factors for colon cancer. On a yearly basis, your caregiver may provide home test kits to check for hidden blood in the stool. Use of a small camera at the end of a tube, to directly examine the colon (sigmoidoscopy or colonoscopy), can detect the earliest forms of colorectal cancer. Talk to your caregiver about this at age 50, when routine screening begins. Direct examination of the colon should be repeated every 5 to 10 years through age 75, unless early forms of pre-cancerous polyps or small growths are found.  Hepatitis C blood testing is recommended for all people born from 1945 through 1965 and any individual with known risks for hepatitis C.  Practice  safe sex. Use condoms and avoid high-risk sexual practices to reduce the spread of sexually transmitted infections (STIs). STIs include gonorrhea, chlamydia, syphilis, trichomonas, herpes, HPV, and human immunodeficiency virus (HIV). Herpes, HIV, and HPV are viral illnesses that have no cure. They can result in disability, cancer, and death. Sexually active women aged 25 and younger should be checked for chlamydia. Older women with new or multiple partners should also be tested for chlamydia. Testing for other STIs is recommended if you are sexually active and at increased risk.  Osteoporosis is a disease in which the bones lose minerals and strength with aging. This can result in serious bone fractures. The risk of osteoporosis can be identified using a bone density scan. Women ages 65 and over and women at risk for fractures or osteoporosis should discuss screening with their caregivers. Ask your caregiver whether you should take a calcium supplement or vitamin D to reduce the rate of osteoporosis.  Menopause can be associated with physical symptoms and risks. Hormone replacement therapy is available to decrease symptoms and risks. You should talk to your caregiver about whether hormone replacement therapy is right for you.  Use sunscreen with sun protection factor (SPF) of 30 or more. Apply sunscreen liberally and repeatedly throughout the day. You should seek shade when your shadow is shorter than you. Protect yourself by wearing long sleeves, pants, a wide-brimmed hat, and sunglasses year round, whenever you are outdoors.  Once a month, do a whole body skin exam, using a mirror to look at the skin on your back. Notify your caregiver of new moles, moles that have irregular borders, moles that are larger than a pencil eraser, or moles that have changed in shape or color.  Stay current with required immunizations.  Influenza. You need a dose every fall (or winter). The composition of the flu vaccine  changes each year, so being vaccinated once is not enough.  Pneumococcal polysaccharide. You need 1 to 2 doses if you smoke cigarettes or if you have certain chronic medical conditions. You need 1 dose at age 65 (or older) if you have never been vaccinated.  Tetanus, diphtheria, pertussis (Tdap, Td). Get 1 dose of Tdap vaccine if you are younger than age 65, are over 65 and have contact with an infant, are a healthcare worker, are pregnant, or simply want to be protected from whooping cough. After that, you need a Td   booster dose every 10 years. Consult your caregiver if you have not had at least 3 tetanus and diphtheria-containing shots sometime in your life or have a deep or dirty wound.  HPV. You need this vaccine if you are a woman age 26 or younger. The vaccine is given in 3 doses over 6 months.  Measles, mumps, rubella (MMR). You need at least 1 dose of MMR if you were born in 1957 or later. You may also need a second dose.  Meningococcal. If you are age 19 to 21 and a first-year college student living in a residence hall, or have one of several medical conditions, you need to get vaccinated against meningococcal disease. You may also need additional booster doses.  Zoster (shingles). If you are age 60 or older, you should get this vaccine.  Varicella (chickenpox). If you have never had chickenpox or you were vaccinated but received only 1 dose, talk to your caregiver to find out if you need this vaccine.  Hepatitis A. You need this vaccine if you have a specific risk factor for hepatitis A virus infection or you simply wish to be protected from this disease. The vaccine is usually given as 2 doses, 6 to 18 months apart.  Hepatitis B. You need this vaccine if you have a specific risk factor for hepatitis B virus infection or you simply wish to be protected from this disease. The vaccine is given in 3 doses, usually over 6 months. Preventive Services / Frequency Ages 19 to 39  Blood  pressure check.** / Every 1 to 2 years.  Lipid and cholesterol check.** / Every 5 years beginning at age 20.  Clinical breast exam.** / Every 3 years for women in their 20s and 30s.  Pap test.** / Every 2 years from ages 21 through 29. Every 3 years starting at age 30 through age 65 or 70 with a history of 3 consecutive normal Pap tests.  HPV screening.** / Every 3 years from ages 30 through ages 65 to 70 with a history of 3 consecutive normal Pap tests.  Hepatitis C blood test.** / For any individual with known risks for hepatitis C.  Skin self-exam. / Monthly.  Influenza immunization.** / Every year.  Pneumococcal polysaccharide immunization.** / 1 to 2 doses if you smoke cigarettes or if you have certain chronic medical conditions.  Tetanus, diphtheria, pertussis (Tdap, Td) immunization. / A one-time dose of Tdap vaccine. After that, you need a Td booster dose every 10 years.  HPV immunization. / 3 doses over 6 months, if you are 26 and younger.  Measles, mumps, rubella (MMR) immunization. / You need at least 1 dose of MMR if you were born in 1957 or later. You may also need a second dose.  Meningococcal immunization. / 1 dose if you are age 19 to 21 and a first-year college student living in a residence hall, or have one of several medical conditions, you need to get vaccinated against meningococcal disease. You may also need additional booster doses.  Varicella immunization.** / Consult your caregiver.  Hepatitis A immunization.** / Consult your caregiver. 2 doses, 6 to 18 months apart.  Hepatitis B immunization.** / Consult your caregiver. 3 doses usually over 6 months. Ages 40 to 64  Blood pressure check.** / Every 1 to 2 years.  Lipid and cholesterol check.** / Every 5 years beginning at age 20.  Clinical breast exam.** / Every year after age 40.  Mammogram.** / Every year beginning at age 40   and continuing for as long as you are in good health. Consult with your  caregiver.  Pap test.** / Every 3 years starting at age 30 through age 65 or 70 with a history of 3 consecutive normal Pap tests.  HPV screening.** / Every 3 years from ages 30 through ages 65 to 70 with a history of 3 consecutive normal Pap tests.  Fecal occult blood test (FOBT) of stool. / Every year beginning at age 50 and continuing until age 75. You may not need to do this test if you get a colonoscopy every 10 years.  Flexible sigmoidoscopy or colonoscopy.** / Every 5 years for a flexible sigmoidoscopy or every 10 years for a colonoscopy beginning at age 50 and continuing until age 75.  Hepatitis C blood test.** / For all people born from 1945 through 1965 and any individual with known risks for hepatitis C.  Skin self-exam. / Monthly.  Influenza immunization.** / Every year.  Pneumococcal polysaccharide immunization.** / 1 to 2 doses if you smoke cigarettes or if you have certain chronic medical conditions.  Tetanus, diphtheria, pertussis (Tdap, Td) immunization.** / A one-time dose of Tdap vaccine. After that, you need a Td booster dose every 10 years.  Measles, mumps, rubella (MMR) immunization. / You need at least 1 dose of MMR if you were born in 1957 or later. You may also need a second dose.  Varicella immunization.** / Consult your caregiver.  Meningococcal immunization.** / Consult your caregiver.  Hepatitis A immunization.** / Consult your caregiver. 2 doses, 6 to 18 months apart.  Hepatitis B immunization.** / Consult your caregiver. 3 doses, usually over 6 months. Ages 65 and over  Blood pressure check.** / Every 1 to 2 years.  Lipid and cholesterol check.** / Every 5 years beginning at age 20.  Clinical breast exam.** / Every year after age 40.  Mammogram.** / Every year beginning at age 40 and continuing for as long as you are in good health. Consult with your caregiver.  Pap test.** / Every 3 years starting at age 30 through age 65 or 70 with a 3  consecutive normal Pap tests. Testing can be stopped between 65 and 70 with 3 consecutive normal Pap tests and no abnormal Pap or HPV tests in the past 10 years.  HPV screening.** / Every 3 years from ages 30 through ages 65 or 70 with a history of 3 consecutive normal Pap tests. Testing can be stopped between 65 and 70 with 3 consecutive normal Pap tests and no abnormal Pap or HPV tests in the past 10 years.  Fecal occult blood test (FOBT) of stool. / Every year beginning at age 50 and continuing until age 75. You may not need to do this test if you get a colonoscopy every 10 years.  Flexible sigmoidoscopy or colonoscopy.** / Every 5 years for a flexible sigmoidoscopy or every 10 years for a colonoscopy beginning at age 50 and continuing until age 75.  Hepatitis C blood test.** / For all people born from 1945 through 1965 and any individual with known risks for hepatitis C.  Osteoporosis screening.** / A one-time screening for women ages 65 and over and women at risk for fractures or osteoporosis.  Skin self-exam. / Monthly.  Influenza immunization.** / Every year.  Pneumococcal polysaccharide immunization.** / 1 dose at age 65 (or older) if you have never been vaccinated.  Tetanus, diphtheria, pertussis (Tdap, Td) immunization. / A one-time dose of Tdap vaccine if you are over   65 and have contact with an infant, are a healthcare worker, or simply want to be protected from whooping cough. After that, you need a Td booster dose every 10 years.  Varicella immunization.** / Consult your caregiver.  Meningococcal immunization.** / Consult your caregiver.  Hepatitis A immunization.** / Consult your caregiver. 2 doses, 6 to 18 months apart.  Hepatitis B immunization.** / Check with your caregiver. 3 doses, usually over 6 months. ** Family history and personal history of risk and conditions may change your caregiver's recommendations. Document Released: 07/24/2001 Document Revised: 08/20/2011  Document Reviewed: 10/23/2010 [employer] Patient Information 2014 [employer].  

## 2013-02-06 ENCOUNTER — Encounter: Payer: Self-pay | Admitting: Family Medicine

## 2013-02-06 ENCOUNTER — Ambulatory Visit
Admission: RE | Admit: 2013-02-06 | Discharge: 2013-02-06 | Disposition: A | Discharge status: Home / Self Care | Payer: No Typology Code available for payment source | Source: Ambulatory Visit | Attending: Family Medicine | Admitting: Family Medicine

## 2013-02-06 DIAGNOSIS — Z1231 Encounter for screening mammogram for malignant neoplasm of breast: Secondary | ICD-10-CM

## 2013-02-12 ENCOUNTER — Telehealth: Payer: Self-pay | Admitting: Hematology and Oncology

## 2013-02-12 ENCOUNTER — Telehealth: Payer: Self-pay | Admitting: General Practice

## 2013-02-12 ENCOUNTER — Telehealth: Payer: Self-pay | Admitting: Emergency Medicine

## 2013-02-12 ENCOUNTER — Encounter: Payer: Self-pay | Admitting: Hematology and Oncology

## 2013-02-12 ENCOUNTER — Ambulatory Visit (HOSPITAL_BASED_OUTPATIENT_CLINIC_OR_DEPARTMENT_OTHER): Payer: No Typology Code available for payment source | Admitting: Hematology and Oncology

## 2013-02-12 ENCOUNTER — Ambulatory Visit: Payer: No Typology Code available for payment source

## 2013-02-12 ENCOUNTER — Ambulatory Visit (HOSPITAL_BASED_OUTPATIENT_CLINIC_OR_DEPARTMENT_OTHER): Payer: No Typology Code available for payment source | Admitting: Lab

## 2013-02-12 VITALS — BP 130/69 | HR 62 | Temp 97.9°F | Resp 18 | Ht 68.0 in | Wt 257.9 lb

## 2013-02-12 DIAGNOSIS — D72829 Elevated white blood cell count, unspecified: Secondary | ICD-10-CM

## 2013-02-12 DIAGNOSIS — R300 Dysuria: Secondary | ICD-10-CM

## 2013-02-12 LAB — URINALYSIS, MICROSCOPIC - CHCC
Blood: NEGATIVE
Ketones: NEGATIVE mg/dL
Protein: NEGATIVE mg/dL
RBC / HPF: NEGATIVE (ref 0–2)
Specific Gravity, Urine: 1.03 (ref 1.003–1.035)
Urobilinogen, UR: 0.2 mg/dL (ref 0.2–1)
pH: 6 (ref 4.6–8.0)

## 2013-02-12 NOTE — Progress Notes (Signed)
Checked in new patient with no insurance. I have her the app for medicaid and the ph# for [employer]. She does have [employer] card. Mail and phone for communication.

## 2013-02-12 NOTE — Telephone Encounter (Signed)
Message copied by Kathee Delton on Thu Feb 12, 2013  9:06 AM ------      Message from: Breanna Berger      Created: Sat Feb 07, 2013  1:50 PM       Please inform patient of slightly abnormal pap smear (ASCUS negative HPV) and need to repeat pap smear next year ------

## 2013-02-12 NOTE — Telephone Encounter (Signed)
Called patient and informed her of results and recommendations. Patient verbalized understanding and asked about getting her biopsy done. Per chart review patient was supposed to follow back up with [place] for endometrial biopsy but nothing is scheduled at the moment. Told patient I would send a note to our front office staff to call and set up an appt for her for the biopsy. Patient verbalized understanding and had no further questions

## 2013-02-12 NOTE — Telephone Encounter (Signed)
gv and pritned appt sched an avs forpt for OCT.Marland Kitchensent pt back to the lab

## 2013-02-17 ENCOUNTER — Other Ambulatory Visit: Payer: Self-pay | Admitting: Neurological Surgery

## 2013-02-17 DIAGNOSIS — M549 Dorsalgia, unspecified: Secondary | ICD-10-CM

## 2013-02-17 DIAGNOSIS — M5010 Cervical disc disorder with radiculopathy, unspecified cervical region: Secondary | ICD-10-CM

## 2013-02-18 ENCOUNTER — Encounter: Payer: Self-pay | Admitting: Obstetrics and Gynecology

## 2013-02-19 ENCOUNTER — Telehealth: Payer: Self-pay | Admitting: *Deleted

## 2013-02-19 ENCOUNTER — Other Ambulatory Visit: Payer: Self-pay | Admitting: Radiology

## 2013-02-19 NOTE — Telephone Encounter (Signed)
Patient left a message that she wants her pap and u/s results.

## 2013-02-20 NOTE — Progress Notes (Signed)
ID: Breanna Berger OB: 01/10/1961  MR#: [id number]  WUJ#:[id number]  [facility]  [phone] [phone]   INITIAL HEMATOLOGY CONSULTATION    Referral MD:  Jeanann Lewandowsky, MD    Reason for Referral:Leukocytosis  HISTORY OF PRESENT ILLNESS: Patient is a 52 y.o. Female who was sent to clinic for evaluation of leukocytosis.First CBC available to me was from 03/18/2007 with WBC 12.5 and ANC 8.9. Since than WBC was always elevate ted from 11.1 up to 13.7   Patient presented today first time to clinic. She reported nasal congestion secondary to allergy, constipation which she handle with prune juice. Her appetite is good and weight is stable. She complains on fatigue on and off, some burning sensation when she urinate..The patient denied fever, chills, night sweats. She denied headaches, double vision, blurry vision, nasal discharge, hearing problems, odynophagia or dysphagia. No chest pain, palpitations, dyspnea, cough, abdominal pain, nausea, vomiting, diarrhea, hematochezia. The patient denied dysuria, nocturia, polyuria, hematuria, myalgia, numbness, tingling, psychiatric problems.  Review of Systems  Constitutional: Positive for malaise/fatigue. Negative for fever, chills, weight loss and diaphoresis.  HENT: Positive for congestion. Negative for hearing loss, nosebleeds, sore throat, neck pain, tinnitus and ear discharge.   Eyes: Negative for blurred vision, double vision, photophobia and pain.  Respiratory: Negative for cough, hemoptysis, sputum production, shortness of breath and wheezing.   Cardiovascular: Negative for chest pain, palpitations, orthopnea, claudication, leg swelling and PND.  Gastrointestinal: Positive for constipation. Negative for heartburn, nausea, vomiting, abdominal pain, diarrhea, blood in stool and melena.  Genitourinary: Positive for dysuria. Negative for urgency, frequency, hematuria and flank pain.  Musculoskeletal: Negative  for myalgias, back pain, joint pain and falls.  Skin: Negative for itching and rash.  Neurological: Negative for dizziness, tingling, tremors, sensory change, speech change, focal weakness, seizures, loss of consciousness, weakness and headaches.  Endo/Heme/Allergies: Does not bruise/bleed easily.  Psychiatric/Behavioral: Negative.     PAST MEDICAL HISTORY: Past Medical History  Diagnosis Date  . Diabetes mellitus   . MVP (mitral valve prolapse)     PAST SURGICAL HISTORY: Past Surgical History  Procedure Laterality Date  . Hernia repair    . Cholecystectomy    . Cesarean section      FAMILY HISTORY No family history on file. Father: hypercholesteremia. Mother: DVT Sister: O.K.  HEALTH MAINTENANCE: History  Substance Use Topics  . Smoking status: Former Games developer  . Smokeless tobacco: Never Used  . Alcohol Use: No    Allergies  Allergen Reactions  . Doxycycline Hyclate     REACTION: rash    Current Outpatient Prescriptions  Medication Sig Dispense Refill  . atenolol (TENORMIN) 25 MG tablet Take 1 tablet (25 mg total) by mouth daily.  30 tablet  4  . digoxin (LANOXIN) 0.25 MG tablet Take 1 tablet (0.25 mg total) by mouth daily.  30 tablet  3  . metFORMIN (GLUCOPHAGE) 1000 MG tablet Take 1 tablet (1,000 mg total) by mouth 2 (two) times daily with a meal.  60 tablet  4  . [DISCONTINUED] cetirizine (ZYRTEC) 10 MG tablet Take 1 tablet (10 mg total) by mouth daily.  30 tablet  4   No current facility-administered medications for this visit.    OBJECTIVE: Filed Vitals:   02/12/13 1111  BP: 130/69  Pulse: 62  Temp: 97.9 F (36.6 C)  Resp: 18     Body mass index is 39.22 kg/(m^2).    ECOG FS:0  PHYSICAL EXAMINATION:  HEENT: Sclerae anicteric.  Conjunctivae were  pink. Pupils round and reactive bilaterally. Oral mucosa is moist without ulceration or thrush. No occipital, submandibular, cervical, supraclavicular or axillar adenopathy. Lungs: clear to auscultation  without wheezes. No rales or rhonchi. Heart: regular rate and rhythm. No murmur, gallop or rubs. Abdomen: soft, non tender. No guarding or rebound tenderness. Bowel sounds are present. No palpable hepatosplenomegaly. MSK: no focal spinal tenderness. Extremities: No clubbing or cyanosis.No calf tenderness to palpitation, no peripheral edema. The patient had grossly intact strength in upper and lower extremities. Skin exam was without ecchymosis, petechiae. Neuro: non-focal, alert and oriented to time, person and place, appropriate affect  LAB RESULTS:  CMP     Component Value Date/Time   NA 136 05/05/2012 1353   K 4.1 05/05/2012 1353   CL 99 05/05/2012 1353   CO2 29 05/05/2012 1353   GLUCOSE 330* 05/05/2012 1353   BUN 8 05/05/2012 1353   CREATININE 0.58 05/05/2012 1353   CALCIUM 9.5 05/05/2012 1353   PROT 7.2 05/05/2012 1353   ALBUMIN 3.3* 05/05/2012 1353   AST 38* 05/05/2012 1353   ALT 46* 05/05/2012 1353   ALKPHOS 108 05/05/2012 1353   BILITOT 0.2* 05/05/2012 1353   GFRNONAA >90 05/05/2012 1353   GFRAA >90 05/05/2012 1353    Lab Results  Component Value Date   WBC 12.1* 11/26/2012   NEUTROABS 7.8* 05/25/2010   HGB 12.7 11/26/2012   HCT 39.7 11/26/2012   MCV 72.6* 11/26/2012   PLT 251 11/26/2012      Chemistry      Component Value Date/Time   NA 136 05/05/2012 1353   K 4.1 05/05/2012 1353   CL 99 05/05/2012 1353   CO2 29 05/05/2012 1353   BUN 8 05/05/2012 1353   CREATININE 0.58 05/05/2012 1353      Component Value Date/Time   CALCIUM 9.5 05/05/2012 1353   ALKPHOS 108 05/05/2012 1353   AST 38* 05/05/2012 1353   ALT 46* 05/05/2012 1353   BILITOT 0.2* 05/05/2012 1353       STUDIES: US Transvaginal Non-ob  21-Feb-2013   *RADIOLOGY REPORT*  Clinical Data: Menopausal bleeding.  History of fibroids.  TRANSABDOMINAL AND TRANSVAGINAL ULTRASOUND OF PELVIS Technique:  Both transabdominal and transvaginal ultrasound examinations of the pelvis were performed. Transabdominal  technique was performed for global imaging of the pelvis including uterus, ovaries, adnexal regions, and pelvic cul-de-sac.  It was necessary to proceed with endovaginal exam following the transabdominal exam to visualize the myometrium, endometrium and adnexa.  Comparison:  None  Findings:  Uterus: Is anteverted and anteflexed and demonstrates a sagittal length of 8.8 cm, depth of 5.4 cm in width of 5.1 cm. The myometrium is poorly resolved on endovaginal and transabdominal exams limiting assessment.  Endometrium: The endometrium is poorly resolved transvaginally. Two areas of focal echogenicity are identified, one in the fundal portion of the uterus measuring 2.1 x 1.7 x 1.9 cm and a second in the mid body of the uterus centrally measuring 1.8 x 1.0 x 1.1 cm. As the endometrium is poorly visualized as a discrete structure it is difficult to tell whether these may represent focal areas of endometrial thickening or are related to echogenic fibroids separate from the endometrium. Cine loop views are more suspicious but not diagnostic for an endometrial etiology.  Right ovary:  Is only seen transabdominally measuring 2.6 x 1.2 x 1.4 cm has a normal transabdominal appearance  Left ovary: Has a normal appearance measuring 2.3 x 1.5 x 1.5 cm  Other findings: No pelvic fluid  or separate adnexal masses are seen  IMPRESSION: Poor overall study resolution due to patient body habitus and scanning parameters on both transabdominal and endovaginal exams. Focal areas of echogenicity centrally positioned within the uterus may represent areas of thickening of the endometrium or be related to echogenic myometrial foci such as lipoleiomyomata. Further evaluation with sonohysterography may be helpful to distinguish between these possibilities and better evaluate the endometrial lining.  Normal ovaries.   Original Report Authenticated By: Rhodia Albright, M.D.   US Pelvis Complete  01/30/2013   *RADIOLOGY REPORT*  Clinical Data:  Menopausal bleeding.  History of fibroids.  TRANSABDOMINAL AND TRANSVAGINAL ULTRASOUND OF PELVIS Technique:  Both transabdominal and transvaginal ultrasound examinations of the pelvis were performed. Transabdominal technique was performed for global imaging of the pelvis including uterus, ovaries, adnexal regions, and pelvic cul-de-sac.  It was necessary to proceed with endovaginal exam following the transabdominal exam to visualize the myometrium, endometrium and adnexa.  Comparison:  None  Findings:  Uterus: Is anteverted and anteflexed and demonstrates a sagittal length of 8.8 cm, depth of 5.4 cm in width of 5.1 cm. The myometrium is poorly resolved on endovaginal and transabdominal exams limiting assessment.  Endometrium: The endometrium is poorly resolved transvaginally. Two areas of focal echogenicity are identified, one in the fundal portion of the uterus measuring 2.1 x 1.7 x 1.9 cm and a second in the mid body of the uterus centrally measuring 1.8 x 1.0 x 1.1 cm. As the endometrium is poorly visualized as a discrete structure it is difficult to tell whether these may represent focal areas of endometrial thickening or are related to echogenic fibroids separate from the endometrium. Cine loop views are more suspicious but not diagnostic for an endometrial etiology.  Right ovary:  Is only seen transabdominally measuring 2.6 x 1.2 x 1.4 cm has a normal transabdominal appearance  Left ovary: Has a normal appearance measuring 2.3 x 1.5 x 1.5 cm  Other findings: No pelvic fluid or separate adnexal masses are seen  IMPRESSION: Poor overall study resolution due to patient body habitus and scanning parameters on both transabdominal and endovaginal exams. Focal areas of echogenicity centrally positioned within the uterus may represent areas of thickening of the endometrium or be related to echogenic myometrial foci such as lipoleiomyomata. Further evaluation with sonohysterography may be helpful to distinguish between  these possibilities and better evaluate the endometrial lining.  Normal ovaries.   Original Report Authenticated By: Rhodia Albright, M.D.   Mm Digital Screening  02/06/2013   *RADIOLOGY REPORT*  Clinical Data: Screening.  DIGITAL SCREENING BILATERAL MAMMOGRAM WITH CAD  Comparison:  Previous exam(s).  FINDINGS:  ACR Breast Density Category b:  There are scattered areas of fibroglandular density.  There are no findings suspicious for malignancy.  Images were processed with CAD.  IMPRESSION: No mammographic evidence of malignancy.  A result letter of this screening mammogram will be mailed directly to the patient.  RECOMMENDATION: Screening mammogram in one year. (Code:SM-B-01Y)  BI-RADS CATEGORY 1:  Negative.   Original Report Authenticated By: Sherian Rein, M.D.    ASSESSMENT AND PLAN: 1. Leukocytosis with neutrophilia. Patient has this problem for at least 6 years. She stopped smoking 6 years ago but restarted again 2 years ago. Even when she stopped smoking she continue to have leukocytosis and neutrophilia. It is possible that it can be secondary to grugs (digoxin, glucophage) but bone marrow problem can not be excluded. I will check BCR-ABL and proceed with bone marrow biopsy. 2. Dysuria. I  will check U/A. Follow up 1 week after bone marrow biopsy.  Myra Rude, MD   02/12/2013 7:11 PM

## 2013-02-20 NOTE — Telephone Encounter (Signed)
Called patient and informed her of results. Patient verbalized understanding and asked if we received her previous records from Dr Stefano Gaul. Per chart review patient did not sign release of information for [place] to obtain these records. Told patient she could come by before her next appt and sign this so we can have her records for her appt. Patient verbalized understanding and had no further questions

## 2013-02-22 ENCOUNTER — Ambulatory Visit
Admission: RE | Admit: 2013-02-22 | Discharge: 2013-02-22 | Disposition: A | Discharge status: Home / Self Care | Payer: No Typology Code available for payment source | Source: Ambulatory Visit | Attending: Neurological Surgery | Admitting: Neurological Surgery

## 2013-02-22 DIAGNOSIS — M5010 Cervical disc disorder with radiculopathy, unspecified cervical region: Secondary | ICD-10-CM

## 2013-02-22 DIAGNOSIS — M549 Dorsalgia, unspecified: Secondary | ICD-10-CM

## 2013-02-23 ENCOUNTER — Telehealth: Payer: Self-pay | Admitting: *Deleted

## 2013-02-23 NOTE — Telephone Encounter (Signed)
Patient called central scheduling to cancel BMBX on sept 17th.  BMBX moved to  Oct 7th. Next f/u is for  Oct 1st.

## 2013-02-25 ENCOUNTER — Ambulatory Visit (HOSPITAL_COMMUNITY): Admission: RE | Admit: 2013-02-25 | Payer: No Typology Code available for payment source | Source: Ambulatory Visit

## 2013-02-25 ENCOUNTER — Ambulatory Visit (HOSPITAL_COMMUNITY): Payer: No Typology Code available for payment source

## 2013-02-26 ENCOUNTER — Other Ambulatory Visit: Payer: Self-pay | Admitting: Orthopedic Surgery

## 2013-02-26 DIAGNOSIS — M25511 Pain in right shoulder: Secondary | ICD-10-CM

## 2013-03-04 ENCOUNTER — Telehealth: Payer: Self-pay | Admitting: Hematology and Oncology

## 2013-03-04 NOTE — Telephone Encounter (Signed)
Per staff message reply from NG. F/u on 10/1 should be at least 10 days after bx scheduled for 10/7. S/w pt and re change and new d/t for 10/20. Date per pt due to other appts and tests.

## 2013-03-05 ENCOUNTER — Ambulatory Visit
Admission: RE | Admit: 2013-03-05 | Discharge: 2013-03-05 | Disposition: A | Discharge status: Home / Self Care | Payer: No Typology Code available for payment source | Source: Ambulatory Visit | Attending: Orthopedic Surgery | Admitting: Orthopedic Surgery

## 2013-03-05 DIAGNOSIS — M25511 Pain in right shoulder: Secondary | ICD-10-CM

## 2013-03-07 ENCOUNTER — Other Ambulatory Visit: Payer: No Typology Code available for payment source

## 2013-03-11 ENCOUNTER — Encounter (HOSPITAL_COMMUNITY): Payer: Self-pay | Admitting: Pharmacy Technician

## 2013-03-11 ENCOUNTER — Ambulatory Visit: Payer: No Typology Code available for payment source | Admitting: Hematology and Oncology

## 2013-03-12 ENCOUNTER — Other Ambulatory Visit: Payer: Self-pay | Admitting: Radiology

## 2013-03-17 ENCOUNTER — Encounter (HOSPITAL_COMMUNITY): Payer: Self-pay

## 2013-03-17 ENCOUNTER — Ambulatory Visit (HOSPITAL_COMMUNITY)
Admission: RE | Admit: 2013-03-17 | Discharge: 2013-03-17 | Disposition: A | Discharge status: Home / Self Care | Payer: No Typology Code available for payment source | Source: Ambulatory Visit | Attending: Hematology and Oncology | Admitting: Hematology and Oncology

## 2013-03-17 DIAGNOSIS — D72829 Elevated white blood cell count, unspecified: Secondary | ICD-10-CM | POA: Insufficient documentation

## 2013-03-17 DIAGNOSIS — Z79899 Other long term (current) drug therapy: Secondary | ICD-10-CM | POA: Insufficient documentation

## 2013-03-17 DIAGNOSIS — E119 Type 2 diabetes mellitus without complications: Secondary | ICD-10-CM | POA: Insufficient documentation

## 2013-03-17 DIAGNOSIS — R718 Other abnormality of red blood cells: Secondary | ICD-10-CM | POA: Insufficient documentation

## 2013-03-17 DIAGNOSIS — E669 Obesity, unspecified: Secondary | ICD-10-CM | POA: Insufficient documentation

## 2013-03-17 LAB — PROTIME-INR
INR: 0.94 (ref 0.00–1.49)
Prothrombin Time: 12.4 seconds (ref 11.6–15.2)

## 2013-03-17 LAB — GLUCOSE, CAPILLARY: Glucose-Capillary: 186 mg/dL — ABNORMAL HIGH (ref 70–99)

## 2013-03-17 LAB — CBC
HCT: 37.8 % (ref 36.0–46.0)
Hemoglobin: 12.4 g/dL (ref 12.0–15.0)
MCHC: 32.8 g/dL (ref 30.0–36.0)
WBC: 12.3 10*3/uL — ABNORMAL HIGH (ref 4.0–10.5)

## 2013-03-17 LAB — APTT: aPTT: 29 seconds (ref 24–37)

## 2013-03-17 MED ORDER — MIDAZOLAM HCL 2 MG/2ML IJ SOLN
INTRAMUSCULAR | Status: AC | PRN
  Administered 2013-03-17 (×5): 1 mg via INTRAVENOUS

## 2013-03-17 MED ORDER — FENTANYL CITRATE 0.05 MG/ML IJ SOLN
INTRAMUSCULAR | Status: AC | PRN
  Administered 2013-03-17: 100 ug via INTRAVENOUS
  Administered 2013-03-17 (×3): 50 ug via INTRAVENOUS

## 2013-03-17 MED ORDER — SODIUM CHLORIDE 0.9 % IV SOLN
INTRAVENOUS | Status: DC
  Administered 2013-03-17: 08:00:00 via INTRAVENOUS

## 2013-03-17 MED ORDER — MIDAZOLAM HCL 2 MG/2ML IJ SOLN
INTRAMUSCULAR | Status: AC
  Filled 2013-03-17: qty 6

## 2013-03-17 MED ORDER — FENTANYL CITRATE 0.05 MG/ML IJ SOLN
INTRAMUSCULAR | Status: AC
  Filled 2013-03-17: qty 6

## 2013-03-17 NOTE — H&P (Signed)
Breanna Berger is an 52 y.o. female.   Chief Complaint: "I'm having a bone marrow biopsy" HPI: Patient with history of persistent leukocytosis/neutrophilia presents today for CT guided bone marrow biopsy.  Past Medical History  Diagnosis Date  . Diabetes mellitus   . MVP (mitral valve prolapse)     Past Surgical History  Procedure Laterality Date  . Hernia repair    . Cholecystectomy    . Cesarean section      History reviewed. No pertinent family history. Social History:  reports that she has quit smoking. She has never used smokeless tobacco. She reports that she does not drink alcohol or use illicit drugs.  Allergies:  Allergies  Allergen Reactions  . Doxycycline Hyclate     REACTION: rash    Current outpatient prescriptions:atenolol (TENORMIN) 25 MG tablet, Take 25 mg by mouth every morning., Disp: , Rfl: ;  digoxin (LANOXIN) 0.25 MG tablet, Take 1 tablet (0.25 mg total) by mouth daily., Disp: 30 tablet, Rfl: 3;  metFORMIN (GLUCOPHAGE) 1000 MG tablet, Take 1 tablet (1,000 mg total) by mouth 2 (two) times daily with a meal., Disp: 60 tablet, Rfl: 4 [DISCONTINUED] cetirizine (ZYRTEC) 10 MG tablet, Take 1 tablet (10 mg total) by mouth daily., Disp: 30 tablet, Rfl: 4 Current facility-administered medications:0.9 %  sodium chloride infusion, , Intravenous, Continuous, Brayton El, PA-C, Last Rate: 20 mL/hr at 03/17/13 0735;  fentaNYL (SUBLIMAZE) 0.05 MG/ML injection, , , , ;  midazolam (VERSED) 2 MG/2ML injection, , , ,    Results for orders placed during the hospital encounter of 03/17/13 (from the past 48 hour(s))  APTT     Status: None   Collection Time    03/17/13  7:35 AM      Result Value Range   aPTT 29  24 - 37 seconds  CBC     Status: Abnormal   Collection Time    03/17/13  7:35 AM      Result Value Range   WBC 12.3 (*) 4.0 - 10.5 K/uL   RBC 5.19 (*) 3.87 - 5.11 MIL/uL   Hemoglobin 12.4  12.0 - 15.0 g/dL   HCT 16.1  09.6 - 04.5 %   MCV 72.8 (*) 78.0 - 100.0  fL   MCH 23.9 (*) 26.0 - 34.0 pg   MCHC 32.8  30.0 - 36.0 g/dL   RDW 40.9  81.1 - 91.4 %   Platelets 222  150 - 400 K/uL  PROTIME-INR     Status: None   Collection Time    03/17/13  7:35 AM      Result Value Range   Prothrombin Time 12.4  11.6 - 15.2 seconds   INR 0.94  0.00 - 1.49   No results found.  Review of Systems  Constitutional: Negative for fever and chills.  Respiratory: Negative for cough and shortness of breath.   Cardiovascular: Negative for chest pain.  Gastrointestinal: Negative for nausea, vomiting and abdominal pain.  Musculoskeletal: Negative for back pain.       Rt shoulder pain  Neurological: Negative for headaches.  Endo/Heme/Allergies: Does not bruise/bleed easily.    Blood pressure 134/75, pulse 72, temperature 98.2 F (36.8 C), temperature source Oral, resp. rate 18, SpO2 100.00%. Physical Exam  Constitutional: She is oriented to person, place, and time. She appears well-developed and well-nourished.  Cardiovascular: Normal rate and regular rhythm.   Respiratory: Effort normal and breath sounds normal.  GI: Soft. Bowel sounds are normal. There is no tenderness.  obese  Musculoskeletal: Normal range of motion. She exhibits no edema.  Neurological: She is alert and oriented to person, place, and time.     Assessment/Plan Pt with hx of persistent leukocytosis/neutrophilia. Plan is for CT guided bone marrow biopsy today. Details/risks of procedure d/w pt with her understanding and consent.  ALLRED,D KEVIN 03/17/2013, 8:55 AM

## 2013-03-17 NOTE — Procedures (Signed)
SUCCESSFUL RT ILIAC BM ASP AND CORE BX 11G NO COMP STABLE PATH PENDING FULL REPORT IN PACS

## 2013-03-17 NOTE — Progress Notes (Signed)
Patient states she did not bring any valuables to the hospital. No one is present at time of preop. Informed patient hospital is not responsible for items brought to the hospital.

## 2013-03-26 ENCOUNTER — Other Ambulatory Visit (HOSPITAL_COMMUNITY)
Admission: RE | Admit: 2013-03-26 | Discharge: 2013-03-26 | Disposition: A | Discharge status: Home / Self Care | Payer: No Typology Code available for payment source | Source: Ambulatory Visit | Attending: Obstetrics and Gynecology | Admitting: Obstetrics and Gynecology

## 2013-03-26 ENCOUNTER — Encounter: Payer: Self-pay | Admitting: Obstetrics and Gynecology

## 2013-03-26 ENCOUNTER — Ambulatory Visit (INDEPENDENT_AMBULATORY_CARE_PROVIDER_SITE_OTHER): Payer: No Typology Code available for payment source | Admitting: Obstetrics and Gynecology

## 2013-03-26 VITALS — BP 112/75 | HR 71 | Temp 99.2°F | Ht 67.0 in | Wt 253.3 lb

## 2013-03-26 DIAGNOSIS — N950 Postmenopausal bleeding: Secondary | ICD-10-CM | POA: Insufficient documentation

## 2013-03-26 NOTE — Progress Notes (Signed)
Subjective:    Patient ID: Breanna Berger, female    DOB: 1961/05/13, 52 y.o.   MRN: [id number]  HPI 52 yo G4P3o13 with BMI 39 with postmenopausal vaginal bleeding. Patient was seen in August following a 1 week episode of postmenopausal vaginal bleeding. Patient declined endometrial biopsy at that time. Patient reports no further episodes of vaginal bleeding since.   Past Medical History  Diagnosis Date  . Diabetes mellitus   . MVP (mitral valve prolapse)    Past Surgical History  Procedure Laterality Date  . Hernia repair    . Cholecystectomy    . Cesarean section    . Cesarean section with bilateral tubal ligation     History reviewed. No pertinent family history. History  Substance Use Topics  . Smoking status: Former Games developer  . Smokeless tobacco: Never Used  . Alcohol Use: No      Review of Systems  All other systems reviewed and are negative.       Objective:   Physical Exam  GENERAL: Well-developed, well-nourished female in no acute distress.  ABDOMEN: Soft, nontender, nondistended. Obeses PELVIC: Normal external female genitalia. Vagina is pink and rugated.  Normal discharge. Normal appearing cervix. Uterus is normal in size. No adnexal mass or tenderness. EXTREMITIES: No cyanosis, clubbing, or edema, 2+ distal pulses.   01/2013 ultrasound Clinical Data: Menopausal bleeding. History of fibroids.  TRANSABDOMINAL AND TRANSVAGINAL ULTRASOUND OF PELVIS  Technique: Both transabdominal and transvaginal ultrasound  examinations of the pelvis were performed. Transabdominal technique  was performed for global imaging of the pelvis including uterus,  ovaries, adnexal regions, and pelvic cul-de-sac.  It was necessary to proceed with endovaginal exam following the  transabdominal exam to visualize the myometrium, endometrium and  adnexa.  Comparison: None  Findings:  Uterus: Is anteverted and anteflexed and demonstrates a sagittal  length of 8.8 cm, depth of 5.4 cm  in width of 5.1 cm. The  myometrium is poorly resolved on endovaginal and transabdominal  exams limiting assessment.  Endometrium: The endometrium is poorly resolved transvaginally.  Two areas of focal echogenicity are identified, one in the fundal  portion of the uterus measuring 2.1 x 1.7 x 1.9 cm and a second in  the mid body of the uterus centrally measuring 1.8 x 1.0 x 1.1 cm.  As the endometrium is poorly visualized as a discrete structure it  is difficult to tell whether these may represent focal areas of  endometrial thickening or are related to echogenic fibroids  separate from the endometrium. Cine loop views are more suspicious  but not diagnostic for an endometrial etiology.  Right ovary: Is only seen transabdominally measuring 2.6 x 1.2 x  1.4 cm has a normal transabdominal appearance  Left ovary: Has a normal appearance measuring 2.3 x 1.5 x 1.5 cm  Other findings: No pelvic fluid or separate adnexal masses are seen  IMPRESSION:  Poor overall study resolution due to patient body habitus and  scanning parameters on both transabdominal and endovaginal exams.  Focal areas of echogenicity centrally positioned within the uterus  may represent areas of thickening of the endometrium or be related  to echogenic myometrial foci such as lipoleiomyomata. Further  evaluation with sonohysterography may be helpful to distinguish  between these possibilities and better evaluate the endometrial  lining.  Normal ovaries.     Assessment & Plan:  52 yo with postmenopausal vaginal bleeding ENDOMETRIAL BIOPSY     The indications for endometrial biopsy were reviewed.   Risks of the  biopsy including cramping, bleeding, infection, uterine perforation, inadequate specimen and need for additional procedures  were discussed. The patient states she understands and agrees to undergo procedure today. Consent was signed. Time out was performed. Urine HCG was negative. A sterile speculum was placed in  the patient's vagina and the cervix was prepped with Betadine. A single-toothed tenaculum was placed on the anterior lip of the cervix to stabilize it. The uterine cavity was sounded to a depth of 8 cm using the uterine sound. The 3 mm pipelle was introduced into the endometrial cavity without difficulty, 2 passes were made.  A  moderate amount of tissue was  sent to pathology. The instruments were removed from the patient's vagina. Minimal bleeding from the cervix was noted. The patient tolerated the procedure well.  Routine post-procedure instructions were given to the patient. The patient will follow up in two weeks to review the results and for further management.   RTC in 2 weeks to discuss results and further management if indicated

## 2013-03-26 NOTE — Progress Notes (Signed)
Pt. States she had one heavy bleeding episode two months ago after having painful sexual intercourse; states that the bleeding lasted for 5-7 days. Pt. Was postmenopause for a year prior to this episode.

## 2013-03-27 ENCOUNTER — Telehealth: Payer: Self-pay | Admitting: Hematology and Oncology

## 2013-03-27 ENCOUNTER — Ambulatory Visit: Payer: No Typology Code available for payment source | Admitting: Hematology and Oncology

## 2013-03-27 NOTE — Telephone Encounter (Signed)
Pt called to r/s 10/20 appt to 10/28 @ 8:30am - pt has new d/t.

## 2013-03-30 ENCOUNTER — Ambulatory Visit: Payer: No Typology Code available for payment source | Admitting: Hematology and Oncology

## 2013-03-30 LAB — CHROMOSOME ANALYSIS, BONE MARROW

## 2013-04-01 ENCOUNTER — Encounter: Payer: Self-pay | Admitting: *Deleted

## 2013-04-03 ENCOUNTER — Telehealth: Payer: Self-pay | Admitting: General Practice

## 2013-04-03 NOTE — Telephone Encounter (Signed)
Message copied by Kathee Delton on Fri Apr 03, 2013 11:17 AM ------      Message from: Catalina Antigua      Created: Tue Mar 31, 2013  8:34 AM       Please inform patient of negative endometrial biopsy results. No evidence of endometrial cancer ------

## 2013-04-03 NOTE — Telephone Encounter (Signed)
Called patient and informed her of negative biopsy results. Patient verbalized understanding and had no further questions

## 2013-04-07 ENCOUNTER — Telehealth: Payer: Self-pay | Admitting: Hematology and Oncology

## 2013-04-07 ENCOUNTER — Ambulatory Visit (HOSPITAL_BASED_OUTPATIENT_CLINIC_OR_DEPARTMENT_OTHER): Payer: No Typology Code available for payment source | Admitting: Hematology and Oncology

## 2013-04-07 VITALS — BP 131/74 | HR 67 | Temp 97.6°F | Resp 20 | Ht 67.0 in | Wt 255.9 lb

## 2013-04-07 DIAGNOSIS — D508 Other iron deficiency anemias: Secondary | ICD-10-CM

## 2013-04-07 DIAGNOSIS — D72829 Elevated white blood cell count, unspecified: Secondary | ICD-10-CM

## 2013-04-07 NOTE — Progress Notes (Signed)
[facility] OFFICE PROGRESS NOTE  JEGEDE, OLUGBEMIGA, MD DIAGNOSIS:  Chronic leukocytosis and microcytosis  SUMMARY OF HEMATOLOGIC HISTORY: This is a patient who is being referred here because of chronic leukocytosis. A review of her blood work from 2008, her white blood cell counts range is between 11.1-13.7. In October 2014, she had a bone marrow aspirate and biopsy INTERVAL HISTORY: Breanna Berger 52 y.o. female returns for further followup. She denies any recent infection.She denies any recent fever, chills, night sweats or abnormal weight loss The patient denies any recent signs or symptoms of bleeding such as spontaneous epistaxis, hematuria or hematochezia.  I have reviewed the past medical history, past surgical history, social history and family history with the patient and they are unchanged from previous note.  ALLERGIES:  is allergic to doxycycline hyclate.  MEDICATIONS:  Current Outpatient Prescriptions  Medication Sig Dispense Refill  . atenolol (TENORMIN) 25 MG tablet Take 25 mg by mouth every morning.      . digoxin (LANOXIN) 0.25 MG tablet Take 1 tablet (0.25 mg total) by mouth daily.  30 tablet  3  . metFORMIN (GLUCOPHAGE) 1000 MG tablet Take 1 tablet (1,000 mg total) by mouth 2 (two) times daily with a meal.  60 tablet  4  . [DISCONTINUED] cetirizine (ZYRTEC) 10 MG tablet Take 1 tablet (10 mg total) by mouth daily.  30 tablet  4   No current facility-administered medications for this visit.     REVIEW OF SYSTEMS:   Constitutional: Denies fevers, chills or night sweats All other systems were reviewed with the patient and are negative.  PHYSICAL EXAMINATION: ECOG PERFORMANCE STATUS: 0 - Asymptomatic  Filed Vitals:   04/07/13 0848  BP: 131/74  Pulse: 67  Temp: 97.6 F (36.4 C)  Resp: 20   Filed Weights   04/07/13 0848  Weight: 255 lb 14.4 oz (116.075 kg)    GENERAL:alert, no distress and comfortable. Patient is morbidly obese NEURO:  alert & oriented x 3 with fluent speech, no focal motor/sensory deficits  LABORATORY DATA:  I have reviewed the data as listed  ASSESSMENT:  #1 chronic leukocytosis, negative for bone marrow malignancy #2 microcytosis with iron deficiency PLAN:  #1 leukocytosis I reviewed the bone marrow results with her. A bone marrow biopsy was negative for leukemia or lymphoma. It is possible that this could be related to mild infection or inflammatory condition. We will just observe. #2 microcytosis with iron deficiency Her last iron test done 3 years ago suggested iron deficiency even though the bone marrow iron stain was adequate. It is possible the patient may have undiagnosed hemoglobinopathy. I suggest oral ion supplements. I would check a ferritin level in her next visit. If her iron store is adequate, we will send off tests for hemoglobinopathy.  All questions were answered. The patient knows to call the clinic with any problems, questions or concerns. No barriers to learning was detected.  I spent 15 minutes counseling the patient face to face. The total time spent in the appointment was 20 minutes and more than 50% was on counseling.     Raeqwon Lux, MD 04/07/2013 10:13 AM

## 2013-04-07 NOTE — Telephone Encounter (Signed)
Gave pt appt for lab and MD on Januaryb 2015

## 2013-04-13 ENCOUNTER — Ambulatory Visit: Payer: No Typology Code available for payment source | Admitting: Obstetrics and Gynecology

## 2013-04-27 ENCOUNTER — Ambulatory Visit: Payer: No Typology Code available for payment source | Admitting: Obstetrics and Gynecology

## 2013-05-01 ENCOUNTER — Encounter: Payer: Self-pay | Admitting: Internal Medicine

## 2013-05-01 ENCOUNTER — Ambulatory Visit: Payer: No Typology Code available for payment source | Attending: Internal Medicine | Admitting: Internal Medicine

## 2013-05-01 VITALS — BP 138/79 | HR 67 | Temp 98.9°F | Resp 16 | Ht 68.0 in | Wt 255.0 lb

## 2013-05-01 DIAGNOSIS — D509 Iron deficiency anemia, unspecified: Secondary | ICD-10-CM | POA: Insufficient documentation

## 2013-05-01 DIAGNOSIS — R350 Frequency of micturition: Secondary | ICD-10-CM | POA: Insufficient documentation

## 2013-05-01 DIAGNOSIS — E119 Type 2 diabetes mellitus without complications: Secondary | ICD-10-CM | POA: Insufficient documentation

## 2013-05-01 DIAGNOSIS — E781 Pure hyperglyceridemia: Secondary | ICD-10-CM | POA: Insufficient documentation

## 2013-05-01 DIAGNOSIS — I059 Rheumatic mitral valve disease, unspecified: Secondary | ICD-10-CM | POA: Insufficient documentation

## 2013-05-01 LAB — COMPLETE METABOLIC PANEL WITH GFR
AST: 36 U/L (ref 0–37)
Alkaline Phosphatase: 85 U/L (ref 39–117)
BUN: 9 mg/dL (ref 6–23)
Calcium: 9.6 mg/dL (ref 8.4–10.5)
Creat: 0.57 mg/dL (ref 0.50–1.10)
GFR, Est Non African American: >89 mL/min
Glucose, Bld: 185 mg/dL — ABNORMAL HIGH (ref 70–99)
Potassium: 4.4 mEq/L (ref 3.5–5.3)
Total Bilirubin: 0.3 mg/dL (ref 0.3–1.2)
Total Protein: 6.9 g/dL (ref 6.0–8.3)

## 2013-05-01 LAB — POCT URINALYSIS DIPSTICK
Bilirubin, UA: NEGATIVE
Ketones, UA: NEGATIVE
Nitrite, UA: POSITIVE
Spec Grav, UA: >=1.03
pH, UA: 5.5

## 2013-05-01 LAB — LIPID PANEL
Cholesterol: 200 mg/dL (ref 0–200)
HDL: 46 mg/dL (ref >39)
Total CHOL/HDL Ratio: 4.3 Ratio

## 2013-05-01 LAB — GLUCOSE, POCT (MANUAL RESULT ENTRY): POC Glucose: 193 mg/dl — AB (ref 70–99)

## 2013-05-01 MED ORDER — LEVOFLOXACIN 500 MG PO TABS: 500.0000 mg | ORAL_TABLET | Freq: Every day | ORAL | Status: DC

## 2013-05-01 MED ORDER — FENOFIBRATE 48 MG PO TABS: 48.0000 mg | ORAL_TABLET | Freq: Every day | ORAL | Status: DC

## 2013-05-01 MED ORDER — GLIMEPIRIDE 4 MG PO TABS: 8.0000 mg | ORAL_TABLET | Freq: Every day | ORAL | Status: DC

## 2013-05-01 NOTE — Progress Notes (Signed)
Patient ID: Breanna Berger, female   DOB: 10-Sep-1960, 52 y.o.   MRN: [id number]  CC:  HPI:  52 year old female here for evaluation of her diabetes. Patient recently had a bone marrow biopsy as well as an endometrial biopsy both of these were negative The patient has a hemoglobin A1c of 8.5 today and glucose of 193. She measures her glucose every other day She was recently started on glimepiride She states that Dr.Harwani, recently discontinued her digoxin and atenolol and switched her to diltiazem She's also requesting ophthalmology referral   Allergies  Allergen Reactions  . Doxycycline Hyclate     REACTION: rash   Past Medical History  Diagnosis Date  . Diabetes mellitus   . MVP (mitral valve prolapse)    Current Outpatient Prescriptions on File Prior to Visit  Medication Sig Dispense Refill  . metFORMIN (GLUCOPHAGE) 1000 MG tablet Take 1 tablet (1,000 mg total) by mouth 2 (two) times daily with a meal.  60 tablet  4  . [DISCONTINUED] cetirizine (ZYRTEC) 10 MG tablet Take 1 tablet (10 mg total) by mouth daily.  30 tablet  4   No current facility-administered medications on file prior to visit.   History reviewed. No pertinent family history. History   Social History  . Marital Status: Single    Spouse Name: N/A    Number of Children: N/A  . Years of Education: N/A   Occupational History  . Not on file.   Social History Main Topics  . Smoking status: Former Games developer  . Smokeless tobacco: Never Used  . Alcohol Use: No  . Drug Use: No  . Sexual Activity: Yes    Birth Control/ Protection: Surgical   Other Topics Concern  . Not on file   Social History Narrative  . No narrative on file    Review of Systems  Constitutional: Negative for fever, chills, diaphoresis, activity change, appetite change and fatigue.  HENT: Negative for ear pain, nosebleeds, congestion, facial swelling, rhinorrhea, neck pain, neck stiffness and ear discharge.   Eyes: Negative for pain,  discharge, redness, itching and visual disturbance.  Respiratory: Negative for cough, choking, chest tightness, shortness of breath, wheezing and stridor.   Cardiovascular: Negative for chest pain, palpitations and leg swelling.  Gastrointestinal: Negative for abdominal distention.  Genitourinary: Negative for dysuria, urgency, frequency, hematuria, flank pain, decreased urine volume, difficulty urinating and dyspareunia.  Musculoskeletal: Negative for back pain, joint swelling, arthralgias and gait problem.  Neurological: Negative for dizziness, tremors, seizures, syncope, facial asymmetry, speech difficulty, weakness, light-headedness, numbness and headaches.  Hematological: Negative for adenopathy. Does not bruise/bleed easily.  Psychiatric/Behavioral: Negative for hallucinations, behavioral problems, confusion, dysphoric mood, decreased concentration and agitation.    Objective:   Filed Vitals:   05/01/13 1029  BP: 138/79  Pulse: 67  Temp: 98.9 F (37.2 C)  Resp: 16    Physical Exam  Constitutional: Appears well-developed and well-nourished. No distress.  HENT: Normocephalic. External right and left ear normal. Oropharynx is clear and moist.  Eyes: Conjunctivae and EOM are normal. PERRLA, no scleral icterus.  Neck: Normal ROM. Neck supple. No JVD. No tracheal deviation. No thyromegaly.  CVS: RRR, S1/S2 +, no murmurs, no gallops, no carotid bruit.  Pulmonary: Effort and breath sounds normal, no stridor, rhonchi, wheezes, rales.  Abdominal: Soft. BS +,  no distension, tenderness, rebound or guarding.  Musculoskeletal: Normal range of motion. No edema and no tenderness.  Lymphadenopathy: No lymphadenopathy noted, cervical, inguinal. Neuro: Alert. Normal reflexes, muscle tone coordination. No  cranial nerve deficit. Skin: Skin is warm and dry. No rash noted. Not diaphoretic. No erythema. No pallor.  Psychiatric: Normal mood and affect. Behavior, judgment, thought content normal.    Lab Results  Component Value Date   WBC 12.3* 03/17/2013   HGB 12.4 03/17/2013   HCT 37.8 03/17/2013   MCV 72.8* 03/17/2013   PLT 222 03/17/2013   Lab Results  Component Value Date   CREATININE 0.58 05/05/2012   BUN 8 05/05/2012   NA 136 05/05/2012   K 4.1 05/05/2012   CL 99 05/05/2012   CO2 29 05/05/2012    Lab Results  Component Value Date   HGBA1C 8.5 05/01/2013   Lipid Panel     Component Value Date/Time   CHOL 206* 11/26/2012 1044   TRIG 328* 11/26/2012 1044   HDL 40 11/26/2012 1044   CHOLHDL 5.2 11/26/2012 1044   VLDL 66* 11/26/2012 1044   LDLCALC 100* 11/26/2012 1044       Assessment and plan:   Patient Active Problem List   Diagnosis Date Noted  . Postmenopausal vaginal bleeding 03/26/2013  . Healthcare maintenance 12/30/2012  . FATIGUE 03/31/2010  . PLANTAR FASCIITIS, BILATERAL 01/06/2010  . EDEMA 01/06/2010  . LEUKOCYTOSIS, CHRONIC 11/25/2009  . TOBACCO ABUSE 11/25/2009  . CONSTIPATION 11/25/2009  . PELVIC  PAIN 11/25/2009  . ALLERGIC RHINITIS, SEASONAL 05/12/2009  . GASTROENTERITIS, ACUTE 07/06/2008  . MICROALBUMINURIA 10/21/2007  . ABDOMINAL BLOATING 03/28/2007  . DYSTHYMIA, SITUATIONAL 03/18/2007  . FREQUENCY, URINARY 03/18/2007  . DM, UNCOMPLICATED, TYPE II 11/19/2006  . HYPERPLASIA, PERSISTENT THYMUS 11/19/2006  . MITRAL VALVE PROLAPSE 11/19/2006  . PSVT 11/19/2006  . Nonspecific (abnormal) findings on radiological and other examination of body structure 10/31/2006  . CHEST XRAY, ABNORMAL 10/31/2006  . HYPERLIPIDEMIA, MIXED 08/23/2006  . ANEMIA, IRON DEFICIENCY, MICROCYTIC 08/23/2006       Diabetes Increase glimepiride to 8 mg a day Continue metformin Patient encouraged to monitor CBG on a daily basis  Urinary frequency Urinalysis positive Called in Levaquin for 7 days Urine culture pending    Hypertriglyceridemia the patient will be started on TriCor 40 mg a day We'll repeat a lipid panel   Iron deficiency anemia, followed by  hematology  Mitral valve prolapse is followed by cardiology  Follow up in 3 months   The patient was given clear instructions to go to ER or return to medical center if symptoms don't improve, worsen or new problems develop. The patient verbalized understanding. The patient was told to call to get any lab results if not heard anything in the next week.

## 2013-05-01 NOTE — Progress Notes (Signed)
Pt is here following up on her diabetes. Pt is here to review result from her biopsy's.  Pt reports that recently she has been having polyuria.

## 2013-05-02 LAB — URINALYSIS, COMPLETE
Casts: NONE SEEN
Crystals: NONE SEEN
Glucose, UA: 100 mg/dL — AB
Leukocytes, UA: NEGATIVE
Protein, ur: NEGATIVE mg/dL
Squamous Epithelial / LPF: NONE SEEN
pH: 5 (ref 5.0–8.0)

## 2013-05-04 LAB — URINE CULTURE: Colony Count: >=100000

## 2013-05-05 ENCOUNTER — Telehealth (HOSPITAL_COMMUNITY): Payer: Self-pay

## 2013-05-15 ENCOUNTER — Telehealth: Payer: Self-pay | Admitting: Internal Medicine

## 2013-05-15 ENCOUNTER — Other Ambulatory Visit: Payer: Self-pay | Admitting: Emergency Medicine

## 2013-05-15 ENCOUNTER — Ambulatory Visit: Payer: No Typology Code available for payment source | Attending: Internal Medicine

## 2013-05-15 VITALS — BP 120/79 | HR 86 | Temp 98.7°F | Resp 18

## 2013-05-15 DIAGNOSIS — N390 Urinary tract infection, site not specified: Secondary | ICD-10-CM | POA: Insufficient documentation

## 2013-05-15 DIAGNOSIS — B3749 Other urogenital candidiasis: Secondary | ICD-10-CM

## 2013-05-15 LAB — POCT URINALYSIS DIPSTICK
Blood, UA: NEGATIVE
Ketones, UA: NEGATIVE
Leukocytes, UA: NEGATIVE
Nitrite, UA: NEGATIVE
Protein, UA: NEGATIVE
Urobilinogen, UA: 0.2

## 2013-05-15 MED ORDER — FLUCONAZOLE 150 MG PO TABS: 150.0000 mg | ORAL_TABLET | Freq: Once | ORAL | Status: DC

## 2013-05-15 MED ORDER — METRONIDAZOLE 500 MG PO TABS: 2000.0000 mg | ORAL_TABLET | Freq: Three times a day (TID) | ORAL | Status: DC

## 2013-05-15 NOTE — Progress Notes (Signed)
Pt here for repeat urine dipstick s/p infection. Pt given script Flagyl 2g once and Diflucan 150 mg once. Renal u/s ordered. Will call Monday with appt CBG-188 BP-120/79

## 2013-05-15 NOTE — Patient Instructions (Signed)
Pt instructed to take prescribed ATB and once dose Diflucan 150 mg tab Will f/u next week

## 2013-05-15 NOTE — Telephone Encounter (Signed)
Patient has not received results for blood work and urinalysis from visit on 05/05/13

## 2013-05-18 ENCOUNTER — Telehealth: Payer: Self-pay | Admitting: Emergency Medicine

## 2013-05-18 NOTE — Telephone Encounter (Signed)
Spoke with pt. Given U/S appt @ [facility] 05/21/13

## 2013-05-18 NOTE — Addendum Note (Signed)
Addended by: Nonnie Done D on: 05/18/2013 05:17 PM   Modules accepted: Orders

## 2013-05-20 ENCOUNTER — Ambulatory Visit (HOSPITAL_COMMUNITY)
Admission: RE | Admit: 2013-05-20 | Discharge: 2013-05-20 | Disposition: A | Discharge status: Home / Self Care | Payer: No Typology Code available for payment source | Source: Ambulatory Visit | Attending: Internal Medicine | Admitting: Internal Medicine

## 2013-05-20 DIAGNOSIS — Z87440 Personal history of urinary (tract) infections: Secondary | ICD-10-CM | POA: Insufficient documentation

## 2013-05-20 DIAGNOSIS — B3749 Other urogenital candidiasis: Secondary | ICD-10-CM

## 2013-07-08 ENCOUNTER — Other Ambulatory Visit: Payer: No Typology Code available for payment source

## 2013-07-08 ENCOUNTER — Ambulatory Visit: Payer: No Typology Code available for payment source | Admitting: Hematology and Oncology

## 2013-07-13 ENCOUNTER — Encounter: Payer: Self-pay | Admitting: *Deleted

## 2013-07-24 ENCOUNTER — Telehealth: Payer: Self-pay | Admitting: Hematology and Oncology

## 2013-07-24 NOTE — Telephone Encounter (Signed)
Gave pt appt for lab and Md for February r/s from january 201

## 2013-07-27 ENCOUNTER — Telehealth: Payer: Self-pay | Admitting: Hematology and Oncology

## 2013-07-27 ENCOUNTER — Encounter: Payer: Self-pay | Admitting: Hematology and Oncology

## 2013-07-27 ENCOUNTER — Ambulatory Visit (HOSPITAL_BASED_OUTPATIENT_CLINIC_OR_DEPARTMENT_OTHER): Payer: No Typology Code available for payment source

## 2013-07-27 ENCOUNTER — Ambulatory Visit (HOSPITAL_BASED_OUTPATIENT_CLINIC_OR_DEPARTMENT_OTHER): Payer: No Typology Code available for payment source | Admitting: Hematology and Oncology

## 2013-07-27 ENCOUNTER — Other Ambulatory Visit (HOSPITAL_BASED_OUTPATIENT_CLINIC_OR_DEPARTMENT_OTHER): Payer: No Typology Code available for payment source

## 2013-07-27 VITALS — BP 133/81 | HR 79 | Temp 98.0°F | Resp 18 | Ht 68.0 in | Wt 270.2 lb

## 2013-07-27 DIAGNOSIS — D72829 Elevated white blood cell count, unspecified: Secondary | ICD-10-CM

## 2013-07-27 DIAGNOSIS — N390 Urinary tract infection, site not specified: Secondary | ICD-10-CM | POA: Insufficient documentation

## 2013-07-27 DIAGNOSIS — D508 Other iron deficiency anemias: Secondary | ICD-10-CM

## 2013-07-27 DIAGNOSIS — R635 Abnormal weight gain: Secondary | ICD-10-CM | POA: Insufficient documentation

## 2013-07-27 DIAGNOSIS — L732 Hidradenitis suppurativa: Secondary | ICD-10-CM

## 2013-07-27 HISTORY — DX: Hidradenitis suppurativa: L73.2

## 2013-07-27 HISTORY — DX: Urinary tract infection, site not specified: N39.0

## 2013-07-27 LAB — CBC & DIFF AND RETIC
BASO%: 0.2 % (ref 0.0–2.0)
BASOS ABS: 0 10*3/uL (ref 0.0–0.1)
EOS ABS: 0.2 10*3/uL (ref 0.0–0.5)
EOS%: 1.6 % (ref 0.0–7.0)
HEMATOCRIT: 39.3 % (ref 34.8–46.6)
HGB: 12.7 g/dL (ref 11.6–15.9)
Immature Retic Fract: 6.9 % (ref 1.60–10.00)
LYMPH#: 3.8 10*3/uL — AB (ref 0.9–3.3)
LYMPH%: 31.3 % (ref 14.0–49.7)
MCH: 23.5 pg — AB (ref 25.1–34.0)
MCHC: 32.3 g/dL (ref 31.5–36.0)
MCV: 72.8 fL — AB (ref 79.5–101.0)
MONO#: 0.6 10*3/uL (ref 0.1–0.9)
MONO%: 4.7 % (ref 0.0–14.0)
NEUT%: 62.2 % (ref 38.4–76.8)
NEUTROS ABS: 7.6 10*3/uL — AB (ref 1.5–6.5)
Platelets: 241 10*3/uL (ref 145–400)
RBC: 5.4 10*6/uL (ref 3.70–5.45)
RDW: 14.7 % — AB (ref 11.2–14.5)
RETIC %: 1.34 % (ref 0.70–2.10)
RETIC CT ABS: 72.36 10*3/uL (ref 33.70–90.70)
WBC: 12.3 10*3/uL — ABNORMAL HIGH (ref 3.9–10.3)
nRBC: 0 % (ref 0–0)

## 2013-07-27 LAB — URINALYSIS, MICROSCOPIC - CHCC
Bilirubin (Urine): NEGATIVE
Blood: NEGATIVE
Glucose: 250 mg/dL
Ketones: NEGATIVE mg/dL
Leukocyte Esterase: NEGATIVE
NITRITE: NEGATIVE
PROTEIN: NEGATIVE mg/dL
RBC / HPF: NEGATIVE (ref 0–2)
Specific Gravity, Urine: 1.03 (ref 1.003–1.035)
UROBILINOGEN UR: 0.2 mg/dL (ref 0.2–1)
pH: 6 (ref 4.6–8.0)

## 2013-07-27 LAB — BASIC METABOLIC PANEL (CC13)
Anion Gap: 9 mEq/L (ref 3–11)
BUN: 10.6 mg/dL (ref 7.0–26.0)
CO2: 27 meq/L (ref 22–29)
CREATININE: 0.8 mg/dL (ref 0.6–1.1)
Calcium: 9.9 mg/dL (ref 8.4–10.4)
Chloride: 104 mEq/L (ref 98–109)
GLUCOSE: 256 mg/dL — AB (ref 70–140)
POTASSIUM: 3.8 meq/L (ref 3.5–5.1)
Sodium: 141 mEq/L (ref 136–145)

## 2013-07-27 LAB — IRON AND TIBC CHCC
%SAT: 24 % (ref 21–57)
Iron: 82 ug/dL (ref 41–142)
TIBC: 345 ug/dL (ref 236–444)
UIBC: 263 ug/dL (ref 120–384)

## 2013-07-27 LAB — FERRITIN CHCC: FERRITIN: 74 ng/mL (ref 9–269)

## 2013-07-27 LAB — TSH CHCC: TSH: 0.913 m(IU)/L (ref 0.308–3.960)

## 2013-07-27 MED ORDER — SULFAMETHOXAZOLE-TMP DS 800-160 MG PO TABS: 1.0000 | ORAL_TABLET | Freq: Every day | ORAL | Status: DC

## 2013-07-27 NOTE — Progress Notes (Signed)
[facility] OFFICE PROGRESS NOTE  JEGEDE, OLUGBEMIGA, MD DIAGNOSIS:  Chronic leukocytosis, and microcytosis  SUMMARY OF HEMATOLOGIC HISTORY: This is a patient who is being referred here because of chronic leukocytosis. A review of her blood work from 2008, her white blood cell counts range is between 11.1-13.7. In October 2014, she had a bone marrow aspirate and biopsy which came back normal. Her microcytosis is thought to be related to possible diagnosis of hemoglobinopathy. Her leukocytosis is due to recurrent infection. The patient has evidence of chronic suppurative hydradenitis INTERVAL HISTORY: Breanna Berger 53 y.o. female returns for further followup. She has been taking iron supplement. The patient denies any recent signs or symptoms of bleeding such as spontaneous epistaxis, hematuria or hematochezia. She had recent urinary tract infection was treated with ciprofloxacin. She still has evidence of dysuria but no urgency or frequency. She also stated that her urine has an unpleasant odor. She denies any fevers or chills. She has excessive weight gain and fatigue. She also complained of facial hair. She also complained of significant skin infection in her armpit sent her groin. The patient is currently smoking 2-3 cigarettes a day and attempting to quit. I have reviewed the past medical history, past surgical history, social history and family history with the patient and they are unchanged from previous note.  ALLERGIES:  is allergic to doxycycline hyclate.  MEDICATIONS:  Current Outpatient Prescriptions  Medication Sig Dispense Refill  . CVS NTS STEP 1 21 MG/24HR patch       . diltiazem (CARDIZEM CD) 180 MG 24 hr capsule       . fenofibrate (TRICOR) 48 MG tablet Take 48 mg by mouth daily.      Marland Kitchen glimepiride (AMARYL) 4 MG tablet Take 4 mg by mouth daily with breakfast.      . HYDROcodone-acetaminophen (NORCO/VICODIN) 5-325 MG per tablet       . metFORMIN  (GLUCOPHAGE) 1000 MG tablet Take 1 tablet (1,000 mg total) by mouth 2 (two) times daily with a meal.  60 tablet  4  . sulfamethoxazole-trimethoprim (BACTRIM DS) 800-160 MG per tablet Take 1 tablet by mouth daily.  30 tablet  1  . [DISCONTINUED] cetirizine (ZYRTEC) 10 MG tablet Take 1 tablet (10 mg total) by mouth daily.  30 tablet  4   No current facility-administered medications for this visit.     REVIEW OF SYSTEMS:   Constitutional: Denies fevers, chills or night sweats Eyes: Denies blurriness of vision Ears, nose, mouth, throat, and face: Denies mucositis or sore throat Respiratory: Denies cough, dyspnea or wheezes Cardiovascular: Denies palpitation, chest discomfort or lower extremity swelling Gastrointestinal:  Denies nausea, heartburn or change in bowel habits Skin: Denies abnormal skin rashes Lymphatics: She has bilateral axillary lymphadenopathy due to skin infections.  Neurological:Denies numbness, tingling or new weaknesses Behavioral/Psych: Mood is stable, no new changes  All other systems were reviewed with the patient and are negative.  PHYSICAL EXAMINATION: ECOG PERFORMANCE STATUS: 1 - Symptomatic but completely ambulatory  Filed Vitals:   07/27/13 1306  BP: 133/81  Pulse: 79  Temp: 98 F (36.7 C)  Resp: 18   Filed Weights   07/27/13 1306  Weight: 270 lb 3.2 oz (122.562 kg)    GENERAL:alert, no distress and comfortable. She is morbidly obese SKIN: skin color, texture, turgor are normal, no rashes or significant lesions. She has facial hair EYES: normal, Conjunctiva are pink and non-injected, sclera clear OROPHARYNX:no exudate, no erythema and lips, buccal mucosa, and tongue  normal  NECK: supple, thyroid normal size, non-tender, without nodularity LYMPH:  Bilateral palpable lymphadenopathy, superficial in her axilla region consistent with hydradenitis. LUNGS: clear to auscultation and percussion with normal breathing effort HEART: regular rate & rhythm and no  murmurs and no lower extremity edema ABDOMEN:abdomen soft, non-tender and normal bowel sounds Musculoskeletal:no cyanosis of digits and no clubbing  NEURO: alert & oriented x 3 with fluent speech, no focal motor/sensory deficits  LABORATORY DATA:  I have reviewed the data as listed Results for orders placed in visit on 07/27/13 (from the past 48 hour(s))  CBC & DIFF AND RETIC     Status: Abnormal   Collection Time    07/27/13 12:50 PM      Result Value Ref Range   WBC 12.3 (*) 3.9 - 10.3 10e3/uL   NEUT# 7.6 (*) 1.5 - 6.5 10e3/uL   HGB 12.7  11.6 - 15.9 g/dL   HCT 39.3  34.8 - 46.6 %   Platelets 241  145 - 400 10e3/uL   MCV 72.8 (*) 79.5 - 101.0 fL   MCH 23.5 (*) 25.1 - 34.0 pg   MCHC 32.3  31.5 - 36.0 g/dL   RBC 5.40  3.70 - 5.45 10e6/uL   RDW 14.7 (*) 11.2 - 14.5 %   lymph# 3.8 (*) 0.9 - 3.3 10e3/uL   MONO# 0.6  0.1 - 0.9 10e3/uL   Eosinophils Absolute 0.2  0.0 - 0.5 10e3/uL   Basophils Absolute 0.0  0.0 - 0.1 10e3/uL   NEUT% 62.2  38.4 - 76.8 %   LYMPH% 31.3  14.0 - 49.7 %   MONO% 4.7  0.0 - 14.0 %   EOS% 1.6  0.0 - 7.0 %   BASO% 0.2  0.0 - 2.0 %   nRBC 0  0 - 0 %   Retic % 1.34  0.70 - 2.10 %   Retic Ct Abs 72.36  33.70 - 90.70 10e3/uL   Immature Retic Fract 6.90  1.60 - 10.00 %    Lab Results  Component Value Date   WBC 12.3* 07/27/2013   HGB 12.7 07/27/2013   HCT 39.3 07/27/2013   MCV 72.8* 07/27/2013   PLT 241 07/27/2013   ASSESSMENT & PLAN:  #1 chronic leukocytosis #2 possible recurrent urinary tract infection #3 chronic suppurative hydradenitis I recommend we check a urine culture today. I recommend chronic suppressive therapy with Bactrim daily. I will recheck her in one month. If her blood count does not resolve or her chronic hydradenitis does not resolve, she may need incision and drainage from general surgery. She agreed to proceed #4 chronic microcytosis I suspect she had thalassemia. I will recheck her iron studies and if it is normal I will tell her to  stop taking iron supplement. #5 chronic tobacco use I spent some time counseling her about the importance of nicotine cessation. The patient has a quit date on 11/09/2013. #6 excessive weight gain I will check a part function #7 excessive facial hair I suspect this is due to poorly controlled diabetes. I recommend consideration for endocrinology review. All questions were answered. The patient knows to call the clinic with any problems, questions or concerns. No barriers to learning was detected.  I spent 25 minutes counseling the patient face to face. The total time spent in the appointment was 40 minutes and more than 50% was on counseling.     [place], [place], MD 07/27/2013 1:32 PM

## 2013-07-27 NOTE — Telephone Encounter (Signed)
Gave pt appt for lab and MD for march 2015

## 2013-07-28 LAB — HEMOGLOBINOPATHY EVALUATION
HEMOGLOBIN OTHER: 0 %
HGB S QUANTITAION: 0 %
Hgb A2 Quant: 2.4 % (ref 2.2–3.2)
Hgb A: 97.6 % (ref 96.8–97.8)
Hgb F Quant: 0 % (ref 0.0–2.0)

## 2013-07-28 LAB — T4, FREE: Free T4: 1.27 ng/dL (ref 0.80–1.80)

## 2013-07-28 LAB — URINE CULTURE

## 2013-07-29 ENCOUNTER — Telehealth: Payer: Self-pay | Admitting: *Deleted

## 2013-07-29 NOTE — Telephone Encounter (Signed)
Left VM for pt informing culture negative and to call back w/ any questions.

## 2013-07-29 NOTE — Telephone Encounter (Signed)
Message copied by Cathlean Cower on Wed Jul 29, 2013  8:26 AM ------      Message from: [facility], [address]: Wed Jul 29, 2013  7:39 AM      Regarding: urine culture       Please let her know her urine culture is negative ------

## 2013-07-30 ENCOUNTER — Ambulatory Visit: Payer: No Typology Code available for payment source | Admitting: Internal Medicine

## 2013-08-24 ENCOUNTER — Other Ambulatory Visit (HOSPITAL_BASED_OUTPATIENT_CLINIC_OR_DEPARTMENT_OTHER): Payer: No Typology Code available for payment source

## 2013-08-24 ENCOUNTER — Encounter: Payer: Self-pay | Admitting: Hematology and Oncology

## 2013-08-24 ENCOUNTER — Telehealth: Payer: Self-pay | Admitting: Hematology and Oncology

## 2013-08-24 ENCOUNTER — Ambulatory Visit (HOSPITAL_BASED_OUTPATIENT_CLINIC_OR_DEPARTMENT_OTHER): Payer: No Typology Code available for payment source | Admitting: Hematology and Oncology

## 2013-08-24 ENCOUNTER — Encounter: Payer: Self-pay | Admitting: Gastroenterology

## 2013-08-24 VITALS — BP 119/64 | HR 89 | Temp 98.3°F | Resp 18 | Ht 68.0 in | Wt 271.0 lb

## 2013-08-24 DIAGNOSIS — R0600 Dyspnea, unspecified: Secondary | ICD-10-CM

## 2013-08-24 DIAGNOSIS — R109 Unspecified abdominal pain: Secondary | ICD-10-CM

## 2013-08-24 DIAGNOSIS — R0989 Other specified symptoms and signs involving the circulatory and respiratory systems: Secondary | ICD-10-CM

## 2013-08-24 DIAGNOSIS — D72829 Elevated white blood cell count, unspecified: Secondary | ICD-10-CM

## 2013-08-24 DIAGNOSIS — R718 Other abnormality of red blood cells: Secondary | ICD-10-CM

## 2013-08-24 DIAGNOSIS — F17200 Nicotine dependence, unspecified, uncomplicated: Secondary | ICD-10-CM

## 2013-08-24 DIAGNOSIS — R0609 Other forms of dyspnea: Secondary | ICD-10-CM

## 2013-08-24 DIAGNOSIS — R198 Other specified symptoms and signs involving the digestive system and abdomen: Secondary | ICD-10-CM

## 2013-08-24 HISTORY — DX: Unspecified abdominal pain: R10.9

## 2013-08-24 HISTORY — DX: Dyspnea, unspecified: R06.00

## 2013-08-24 LAB — CBC WITH DIFFERENTIAL/PLATELET
BASO%: 1.3 % (ref 0.0–2.0)
BASOS ABS: 0.2 10*3/uL — AB (ref 0.0–0.1)
EOS%: 2 % (ref 0.0–7.0)
Eosinophils Absolute: 0.2 10*3/uL (ref 0.0–0.5)
HEMATOCRIT: 39.6 % (ref 34.8–46.6)
HEMOGLOBIN: 12.4 g/dL (ref 11.6–15.9)
LYMPH%: 30.1 % (ref 14.0–49.7)
MCH: 23.3 pg — ABNORMAL LOW (ref 25.1–34.0)
MCHC: 31.4 g/dL — ABNORMAL LOW (ref 31.5–36.0)
MCV: 74.1 fL — AB (ref 79.5–101.0)
MONO#: 0.6 10*3/uL (ref 0.1–0.9)
MONO%: 4.8 % (ref 0.0–14.0)
NEUT%: 61.8 % (ref 38.4–76.8)
NEUTROS ABS: 7.3 10*3/uL — AB (ref 1.5–6.5)
Platelets: 216 10*3/uL (ref 145–400)
RBC: 5.35 10*6/uL (ref 3.70–5.45)
RDW: 14.9 % — ABNORMAL HIGH (ref 11.2–14.5)
WBC: 11.8 10*3/uL — ABNORMAL HIGH (ref 3.9–10.3)
lymph#: 3.5 10*3/uL — ABNORMAL HIGH (ref 0.9–3.3)

## 2013-08-24 LAB — BASIC METABOLIC PANEL (CC13)
ANION GAP: 11 meq/L (ref 3–11)
BUN: 11.8 mg/dL (ref 7.0–26.0)
CO2: 23 mEq/L (ref 22–29)
CREATININE: 0.8 mg/dL (ref 0.6–1.1)
Calcium: 9.9 mg/dL (ref 8.4–10.4)
Chloride: 106 mEq/L (ref 98–109)
Glucose: 224 mg/dl — ABNORMAL HIGH (ref 70–140)
POTASSIUM: 4.6 meq/L (ref 3.5–5.1)
Sodium: 140 mEq/L (ref 136–145)

## 2013-08-24 NOTE — Telephone Encounter (Signed)
gv adn printed appt sched and avs for pt for March and  April.Marland KitchenMarland KitchenMarland KitchenPt sched to see Dr. Deatra Ina on 4.15 @ 10am....pt sched to see Dr. Joya Gaskins on 3.30.15 @ 2:30

## 2013-08-24 NOTE — Progress Notes (Signed)
Breanna Berger OFFICE PROGRESS NOTE  JEGEDE, OLUGBEMIGA, MD DIAGNOSIS:  Leukocytosis and history of anemia  SUMMARY OF HEMATOLOGIC HISTORY: This is a patient who is being referred here because of chronic leukocytosis. A review of her blood work from 2008, her white blood cell counts range is between 11.1-13.7. In October 2014, she had a bone marrow aspirate and biopsy which came back normal. Her microcytosis is thought to be related to possible diagnosis of hemoglobinopathy. Her leukocytosis is due to recurrent infection. The patient has evidence of chronic suppurative hydradenitis INTERVAL HISTORY: Breanna Berger 53 y.o. female returns for further followup. She has been taking antibiotic for one month. She is attempting to quit smoking, down to 5 cigarettes a day. She complained of mild shortness of breath on exertion. She has new complaint of lower abdominal cramps and new onset of constipation. She has discontinue taking iron supplements. I have reviewed the past medical history, past surgical history, social history and family history with the patient and they are unchanged from previous note.  ALLERGIES:  is allergic to doxycycline hyclate.  MEDICATIONS:  Current Outpatient Prescriptions  Medication Sig Dispense Refill  . CVS NTS STEP 1 21 MG/24HR patch       . diltiazem (CARDIZEM CD) 180 MG 24 hr capsule       . fenofibrate (TRICOR) 48 MG tablet Take 48 mg by mouth daily.      Marland Kitchen glimepiride (AMARYL) 4 MG tablet Take 4 mg by mouth daily with breakfast.      . HYDROcodone-acetaminophen (NORCO/VICODIN) 5-325 MG per tablet       . metFORMIN (GLUCOPHAGE) 1000 MG tablet Take 1 tablet (1,000 mg total) by mouth 2 (two) times daily with a meal.  60 tablet  4  . sulfamethoxazole-trimethoprim (BACTRIM DS) 800-160 MG per tablet Take 1 tablet by mouth daily.  30 tablet  1  . [DISCONTINUED] cetirizine (ZYRTEC) 10 MG tablet Take 1 tablet (10 mg total) by mouth daily.  30 tablet  4    No current facility-administered medications for this visit.     REVIEW OF SYSTEMS:   Behavioral/Psych: Mood is stable, no new changes  All other systems were reviewed with the patient and are negative.  PHYSICAL EXAMINATION: ECOG PERFORMANCE STATUS: 0 - Asymptomatic  Filed Vitals:   08/24/13 1354  BP: 119/64  Pulse: 89  Temp: 98.3 F (36.8 C)  Resp: 18   Filed Weights   08/24/13 1354  Weight: 271 lb (122.925 kg)    GENERAL:alert, no distress and comfortable. She is morbidly obese Musculoskeletal:no cyanosis of digits and no clubbing  NEURO: alert & oriented x 3 with fluent speech, no focal motor/sensory deficits  LABORATORY DATA:  I have reviewed the data as listed Results for orders placed in visit on 08/24/13 (from the past 48 hour(s))  CBC WITH DIFFERENTIAL     Status: Abnormal   Collection Time    08/24/13  1:44 PM      Result Value Ref Range   WBC 11.8 (*) 3.9 - 10.3 10e3/uL   NEUT# 7.3 (*) 1.5 - 6.5 10e3/uL   HGB 12.4  11.6 - 15.9 g/dL   HCT 39.6  34.8 - 46.6 %   Platelets 216  145 - 400 10e3/uL   MCV 74.1 (*) 79.5 - 101.0 fL   MCH 23.3 (*) 25.1 - 34.0 pg   MCHC 31.4 (*) 31.5 - 36.0 g/dL   RBC 5.35  3.70 - 5.45 10e6/uL   RDW 14.9 (*)  11.2 - 14.5 %   lymph# 3.5 (*) 0.9 - 3.3 10e3/uL   MONO# 0.6  0.1 - 0.9 10e3/uL   Eosinophils Absolute 0.2  0.0 - 0.5 10e3/uL   Basophils Absolute 0.2 (*) 0.0 - 0.1 10e3/uL   NEUT% 61.8  38.4 - 76.8 %   LYMPH% 30.1  14.0 - 49.7 %   MONO% 4.8  0.0 - 14.0 %   EOS% 2.0  0.0 - 7.0 %   BASO% 1.3  0.0 - 2.0 %    Lab Results  Component Value Date   WBC 11.8* 08/24/2013   HGB 12.4 08/24/2013   HCT 39.6 08/24/2013   MCV 74.1* 08/24/2013   PLT 216 08/24/2013   ASSESSMENT & PLAN:  #1 chronic leukocytosis #2 shortness of breath on minimal exertion She was placed on chronic suppressive therapy with Bactrim daily. Leukocytosis is improving but not normal. She still had predominant neutrophilia seen on the peripheral blood. I  suspect the cause of this could be due to undiagnosed COPD. I recommend she quit smoking. I recommend referral to pulmonologist for management of possible COPD. She agreed. #3 chronic microcytosis I suspect she had thalassemia. Iron studies were normal. I suspect she has mild thalassemia trait. #4 chronic tobacco use I spent some time counseling her about the importance of nicotine cessation. The patient has a quit date on 11/09/2013. #5 lower abdominal pain #6 new onset of bowel habit changes I recommend referral to GI for colonoscopy and evaluation. All questions were answered. The patient knows to call the clinic with any problems, questions or concerns. No barriers to learning was detected.  I spent 15 minutes counseling the patient face to face. The total time spent in the appointment was 20 minutes and more than 50% was on counseling.     [place], [place], MD 08/24/2013 2:20 PM

## 2013-09-07 ENCOUNTER — Encounter: Payer: Self-pay | Admitting: Critical Care Medicine

## 2013-09-07 ENCOUNTER — Ambulatory Visit (INDEPENDENT_AMBULATORY_CARE_PROVIDER_SITE_OTHER)
Admission: RE | Admit: 2013-09-07 | Discharge: 2013-09-07 | Disposition: A | Discharge status: Home / Self Care | Payer: No Typology Code available for payment source | Source: Ambulatory Visit | Attending: Critical Care Medicine | Admitting: Critical Care Medicine

## 2013-09-07 ENCOUNTER — Ambulatory Visit (INDEPENDENT_AMBULATORY_CARE_PROVIDER_SITE_OTHER): Payer: No Typology Code available for payment source | Admitting: Critical Care Medicine

## 2013-09-07 VITALS — BP 126/72 | HR 82 | Ht 68.0 in | Wt 275.2 lb

## 2013-09-07 DIAGNOSIS — R5383 Other fatigue: Secondary | ICD-10-CM

## 2013-09-07 DIAGNOSIS — R0609 Other forms of dyspnea: Secondary | ICD-10-CM

## 2013-09-07 DIAGNOSIS — R0989 Other specified symptoms and signs involving the circulatory and respiratory systems: Secondary | ICD-10-CM

## 2013-09-07 DIAGNOSIS — R0600 Dyspnea, unspecified: Secondary | ICD-10-CM

## 2013-09-07 DIAGNOSIS — G4733 Obstructive sleep apnea (adult) (pediatric): Secondary | ICD-10-CM

## 2013-09-07 DIAGNOSIS — J441 Chronic obstructive pulmonary disease with (acute) exacerbation: Secondary | ICD-10-CM

## 2013-09-07 DIAGNOSIS — R5381 Other malaise: Secondary | ICD-10-CM

## 2013-09-07 DIAGNOSIS — F17200 Nicotine dependence, unspecified, uncomplicated: Secondary | ICD-10-CM

## 2013-09-07 MED ORDER — PREDNISONE 10 MG PO TABS: ORAL_TABLET | ORAL | Status: DC

## 2013-09-07 MED ORDER — AMOXICILLIN-POT CLAVULANATE 875-125 MG PO TABS
1.0000 | ORAL_TABLET | Freq: Two times a day (BID) | ORAL | Status: DC
Start: 2013-09-07 — End: 2013-09-23

## 2013-09-07 MED ORDER — FLUTICASONE PROPIONATE 50 MCG/ACT NA SUSP: 2.0000 | Freq: Every day | NASAL | Status: DC

## 2013-09-07 NOTE — Progress Notes (Signed)
Subjective:    Patient ID: Breanna Berger, female    DOB: Mar 14, 1961, 53 y.o.   MRN: [id number]  HPI Comments: Referred by PCP.  Pt with ongoing dyspnea and associated smoking use.  Pt notes pndrip, sinuses congested, ears stopped up. Throat is irritated and now into the chest. Also more QHS snoring. Notes daytimes sleepiness. No sleep study done  Shortness of Breath This is a new problem. The current episode started more than 1 month ago. The problem occurs daily (exertional only, bending over, picking up material, walking up stairs). The problem has been gradually worsening. Associated symptoms include ear pain and a sore throat. Pertinent negatives include no abdominal pain, chest pain, fever, headaches, hemoptysis, leg pain, leg swelling, neck pain, orthopnea, PND, rash, rhinorrhea, sputum production, swollen glands, syncope, vomiting or wheezing. The symptoms are aggravated by weather changes, any activity and exercise. Associated symptoms comments: Notes some chest tightness. Risk factors include smoking. She has tried beta agonist inhalers for the symptoms. The treatment provided moderate relief. Her past medical history is significant for asthma. There is no history of allergies, aspirin allergies, bronchiolitis, CAD, chronic lung disease, COPD, DVT, a heart failure, PE, pneumonia or a recent surgery.   Still smoking 1-3 cig per day  Past Medical History  Diagnosis Date  . Diabetes mellitus   . MVP (mitral valve prolapse)   . Suppurative hidradenitis 07/27/2013  . UTI (lower urinary tract infection) 07/27/2013  . Abdominal pain, unspecified site 08/24/2013  . Dyspnea 08/24/2013     Family History  Problem Relation Age of Onset  . Diabetes Paternal Grandmother   . Heart disease Paternal Aunt   . Clotting disorder Mother   . Cancer Maternal Aunt     stomach  . Cancer Maternal Aunt     colon  . Cancer Maternal Uncle     prostate     History   Social History  . Marital Status:  Single    Spouse Name: N/A    Number of Children: N/A  . Years of Education: N/A   Occupational History  . Not on file.   Social History Main Topics  . Smoking status: Current Every Day Smoker -- 0.20 packs/day for 20 years    Types: Cigarettes  . Smokeless tobacco: Never Used     Comment: currently smoking 3 cigs/day.  . Alcohol Use: No  . Drug Use: No  . Sexual Activity: Yes    Birth Control/ Protection: Surgical   Other Topics Concern  . Not on file   Social History Narrative  . No narrative on file     Allergies  Allergen Reactions  . Doxycycline Hyclate     REACTION: rash     Outpatient Prescriptions Prior to Visit  Medication Sig Dispense Refill  . diltiazem (CARDIZEM CD) 180 MG 24 hr capsule Take 180 mg by mouth daily.       . fenofibrate (TRICOR) 48 MG tablet Take 48 mg by mouth daily.      Marland Kitchen glimepiride (AMARYL) 4 MG tablet Take 4 mg by mouth daily with breakfast.      . metFORMIN (GLUCOPHAGE) 1000 MG tablet Take 1 tablet (1,000 mg total) by mouth 2 (two) times daily with a meal.  60 tablet  4  . [facility] NTS STEP 1 21 MG/24HR patch Place 21 mg onto the skin daily.       Marland Kitchen HYDROcodone-acetaminophen (NORCO/VICODIN) 5-325 MG per tablet Take 1-2 tablets by mouth every 6 (six) hours  as needed.       . sulfamethoxazole-trimethoprim (BACTRIM DS) 800-160 MG per tablet Take 1 tablet by mouth daily.  30 tablet  1   No facility-administered medications prior to visit.     Review of Systems  Constitutional: Negative for fever and unexpected weight change.  HENT: Positive for congestion, ear pain, postnasal drip, sinus pressure and sore throat. Negative for dental problem, nosebleeds, rhinorrhea, sneezing and trouble swallowing.   Eyes: Positive for itching. Negative for redness.  Respiratory: Positive for shortness of breath. Negative for cough, hemoptysis, sputum production, chest tightness and wheezing.   Cardiovascular: Negative for chest pain, palpitations, orthopnea,  leg swelling, syncope and PND.  Gastrointestinal: Negative for nausea, vomiting and abdominal pain.  Genitourinary: Negative for dysuria.  Musculoskeletal: Negative for joint swelling and neck pain.  Skin: Negative for rash.  Neurological: Negative for headaches.  Hematological: Does not bruise/bleed easily.  Psychiatric/Behavioral: Negative for dysphoric mood. The patient is not nervous/anxious.        Objective:   Physical Exam Filed Vitals:   09/07/13 1501  BP: 126/72  Pulse: 82  Height: 5\' 8"  (1.727 m)  Weight: 275 lb 3.2 oz (124.83 kg)  SpO2: 99%    Gen: Pleasant, well-nourished, in no distress,  normal affect  ENT: No lesions,  mouth clear,  oropharynx clear, no postnasal drip  Neck: No JVD, no TMG, no carotid bruits  Lungs: No use of accessory muscles, no dullness to percussion, exp wheeze  Cardiovascular: RRR, heart sounds normal, no murmur or gallops, no peripheral edema  Abdomen: soft and NT, no HSM,  BS normal  Musculoskeletal: No deformities, no cyanosis or clubbing  Neuro: alert, non focal  Skin: Warm, no lesions or rashes  Dg Chest 2 View  09/07/2013   CLINICAL DATA:  Chest congestion.  Short of breath.  EXAM: CHEST  2 VIEW  COMPARISON:  08/20/2012.  FINDINGS: The heart size and mediastinal contours are within normal limits. Both lungs are clear. The visualized skeletal structures are unremarkable.  IMPRESSION: No active cardiopulmonary disease.   Electronically Signed   By: Lajean Manes M.D.   On: 09/07/2013 16:42          Assessment & Plan:   Obstructive chronic bronchitis with exacerbation Asthmatic bronchitis with tobacco abuse Note CXR normal 09/07/13 Plan A home sleep study will be obtained A pulmonary function study will be obtained Start flonase two puff ea nostril daily Start prednisone 10mg  Take 4 for two days three for two days two for two days one for two days Start augmentin one twice daily for 7days Stop smoking, use nicorette  minis 4mg   Return 6 weeks   OSA (obstructive sleep apnea) prob sleep apnea Sleep study  TOBACCO ABUSE Ongoing tobacco use Plan Smoking cessation counseling 3-44min   Updated Medication List Outpatient Encounter Prescriptions as of 09/07/2013  Medication Sig  . diltiazem (CARDIZEM CD) 180 MG 24 hr capsule Take 180 mg by mouth daily.   . fenofibrate (TRICOR) 48 MG tablet Take 48 mg by mouth daily.  Marland Kitchen glimepiride (AMARYL) 4 MG tablet Take 4 mg by mouth daily with breakfast.  . metFORMIN (GLUCOPHAGE) 1000 MG tablet Take 1 tablet (1,000 mg total) by mouth 2 (two) times daily with a meal.  . amoxicillin-clavulanate (AUGMENTIN) 875-125 MG per tablet Take 1 tablet by mouth 2 (two) times daily.  . [facility] NTS STEP 1 21 MG/24HR patch Place 21 mg onto the skin daily.   . fluticasone (FLONASE) 50 MCG/ACT  nasal spray Place 2 sprays into both nostrils daily.  . predniSONE (DELTASONE) 10 MG tablet Take 4 for two days three for two days two for two days one for two days  . [DISCONTINUED] HYDROcodone-acetaminophen (NORCO/VICODIN) 5-325 MG per tablet Take 1-2 tablets by mouth every 6 (six) hours as needed.   . [DISCONTINUED] sulfamethoxazole-trimethoprim (BACTRIM DS) 800-160 MG per tablet Take 1 tablet by mouth daily.

## 2013-09-07 NOTE — Progress Notes (Signed)
Quick Note:  Notify the patient that the Xray is stable and no pneumonia No change in medications are recommended. Continue current meds as prescribed at last office visit ______ 

## 2013-09-07 NOTE — Patient Instructions (Signed)
A home sleep study will be obtained A pulmonary function study will be obtained Start flonase two puff ea nostril daily Start prednisone 10mg  Take 4 for two days three for two days two for two days one for two days Start augmentin one twice daily for 7days A chest xray will be obtained Stop smoking, use nicorette minis 4mg   Return 6 weeks

## 2013-09-08 DIAGNOSIS — J449 Chronic obstructive pulmonary disease, unspecified: Secondary | ICD-10-CM | POA: Insufficient documentation

## 2013-09-08 DIAGNOSIS — G4733 Obstructive sleep apnea (adult) (pediatric): Secondary | ICD-10-CM | POA: Insufficient documentation

## 2013-09-08 DIAGNOSIS — J45909 Unspecified asthma, uncomplicated: Secondary | ICD-10-CM | POA: Insufficient documentation

## 2013-09-08 NOTE — Assessment & Plan Note (Signed)
Asthmatic bronchitis with tobacco abuse Note CXR normal 09/07/13 Plan A home sleep study will be obtained A pulmonary function study will be obtained Start flonase two puff ea nostril daily Start prednisone 10mg  Take 4 for two days three for two days two for two days one for two days Start augmentin one twice daily for 7days Stop smoking, use nicorette minis 4mg   Return 6 weeks

## 2013-09-08 NOTE — Progress Notes (Signed)
Quick Note:  Called, spoke with pt. Informed her of cxr results and recs per Dr. Wright. She verbalized understanding. ______ 

## 2013-09-08 NOTE — Assessment & Plan Note (Signed)
prob sleep apnea Sleep study

## 2013-09-08 NOTE — Assessment & Plan Note (Signed)
Ongoing tobacco use Plan Smoking cessation counseling 3-16min

## 2013-09-14 ENCOUNTER — Emergency Department (HOSPITAL_COMMUNITY): Payer: No Typology Code available for payment source

## 2013-09-14 ENCOUNTER — Emergency Department (HOSPITAL_COMMUNITY)
Admission: EM | Admit: 2013-09-14 | Discharge: 2013-09-14 | Disposition: A | Discharge status: Home / Self Care | Payer: No Typology Code available for payment source | Attending: Emergency Medicine | Admitting: Emergency Medicine

## 2013-09-14 ENCOUNTER — Encounter (HOSPITAL_COMMUNITY): Payer: Self-pay | Admitting: Emergency Medicine

## 2013-09-14 DIAGNOSIS — K5900 Constipation, unspecified: Secondary | ICD-10-CM | POA: Insufficient documentation

## 2013-09-14 DIAGNOSIS — R142 Eructation: Secondary | ICD-10-CM | POA: Insufficient documentation

## 2013-09-14 DIAGNOSIS — F17200 Nicotine dependence, unspecified, uncomplicated: Secondary | ICD-10-CM | POA: Insufficient documentation

## 2013-09-14 DIAGNOSIS — Z8679 Personal history of other diseases of the circulatory system: Secondary | ICD-10-CM | POA: Insufficient documentation

## 2013-09-14 DIAGNOSIS — Z872 Personal history of diseases of the skin and subcutaneous tissue: Secondary | ICD-10-CM | POA: Insufficient documentation

## 2013-09-14 DIAGNOSIS — Z9889 Other specified postprocedural states: Secondary | ICD-10-CM | POA: Insufficient documentation

## 2013-09-14 DIAGNOSIS — R141 Gas pain: Secondary | ICD-10-CM | POA: Insufficient documentation

## 2013-09-14 DIAGNOSIS — E119 Type 2 diabetes mellitus without complications: Secondary | ICD-10-CM | POA: Insufficient documentation

## 2013-09-14 DIAGNOSIS — Z792 Long term (current) use of antibiotics: Secondary | ICD-10-CM | POA: Insufficient documentation

## 2013-09-14 DIAGNOSIS — Z9089 Acquired absence of other organs: Secondary | ICD-10-CM | POA: Insufficient documentation

## 2013-09-14 DIAGNOSIS — Z87440 Personal history of urinary (tract) infections: Secondary | ICD-10-CM | POA: Insufficient documentation

## 2013-09-14 DIAGNOSIS — R143 Flatulence: Secondary | ICD-10-CM

## 2013-09-14 DIAGNOSIS — Z79899 Other long term (current) drug therapy: Secondary | ICD-10-CM | POA: Insufficient documentation

## 2013-09-14 DIAGNOSIS — IMO0002 Reserved for concepts with insufficient information to code with codable children: Secondary | ICD-10-CM | POA: Insufficient documentation

## 2013-09-14 LAB — URINALYSIS, ROUTINE W REFLEX MICROSCOPIC
BILIRUBIN URINE: NEGATIVE
Glucose, UA: NEGATIVE mg/dL
HGB URINE DIPSTICK: NEGATIVE
KETONES UR: NEGATIVE mg/dL
Nitrite: NEGATIVE
Protein, ur: NEGATIVE mg/dL
Specific Gravity, Urine: 1.02 (ref 1.005–1.030)
UROBILINOGEN UA: 0.2 mg/dL (ref 0.0–1.0)
pH: 5 (ref 5.0–8.0)

## 2013-09-14 LAB — CBC WITH DIFFERENTIAL/PLATELET
BASOS ABS: 0 10*3/uL (ref 0.0–0.1)
Basophils Relative: 0 % (ref 0–1)
Eosinophils Absolute: 0.2 10*3/uL (ref 0.0–0.7)
Eosinophils Relative: 1 % (ref 0–5)
HEMATOCRIT: 36.9 % (ref 36.0–46.0)
Hemoglobin: 11.9 g/dL — ABNORMAL LOW (ref 12.0–15.0)
LYMPHS PCT: 26 % (ref 12–46)
Lymphs Abs: 4.1 10*3/uL — ABNORMAL HIGH (ref 0.7–4.0)
MCH: 23.5 pg — ABNORMAL LOW (ref 26.0–34.0)
MCHC: 32.2 g/dL (ref 30.0–36.0)
MCV: 72.8 fL — ABNORMAL LOW (ref 78.0–100.0)
MONOS PCT: 5 % (ref 3–12)
Monocytes Absolute: 0.8 10*3/uL (ref 0.1–1.0)
Neutro Abs: 10.6 10*3/uL — ABNORMAL HIGH (ref 1.7–7.7)
Neutrophils Relative %: 68 % (ref 43–77)
Platelets: 228 10*3/uL (ref 150–400)
RBC: 5.07 MIL/uL (ref 3.87–5.11)
RDW: 15 % (ref 11.5–15.5)
WBC: 15.7 10*3/uL — AB (ref 4.0–10.5)

## 2013-09-14 LAB — URINE MICROSCOPIC-ADD ON

## 2013-09-14 LAB — COMPREHENSIVE METABOLIC PANEL
ALBUMIN: 3.1 g/dL — AB (ref 3.5–5.2)
ALK PHOS: 79 U/L (ref 39–117)
ALT: 25 U/L (ref 0–35)
AST: 18 U/L (ref 0–37)
BILIRUBIN TOTAL: 0.2 mg/dL — AB (ref 0.3–1.2)
BUN: 11 mg/dL (ref 6–23)
CO2: 23 mEq/L (ref 19–32)
Calcium: 9.5 mg/dL (ref 8.4–10.5)
Chloride: 101 mEq/L (ref 96–112)
Creatinine, Ser: 0.56 mg/dL (ref 0.50–1.10)
GFR calc Af Amer: >90 mL/min (ref >90)
GFR calc non Af Amer: >90 mL/min (ref >90)
Glucose, Bld: 133 mg/dL — ABNORMAL HIGH (ref 70–99)
POTASSIUM: 4.3 meq/L (ref 3.7–5.3)
Sodium: 138 mEq/L (ref 137–147)
Total Protein: 6.7 g/dL (ref 6.0–8.3)

## 2013-09-14 LAB — LIPASE, BLOOD: Lipase: 25 U/L (ref 11–59)

## 2013-09-14 MED ORDER — IOHEXOL 300 MG/ML  SOLN
50.0000 mL | Freq: Once | INTRAMUSCULAR | Status: AC | PRN
  Administered 2013-09-14: 50 mL via ORAL

## 2013-09-14 MED ORDER — POLYETHYLENE GLYCOL 3350 17 G PO PACK: PACK | ORAL | Status: DC

## 2013-09-14 MED ORDER — IOHEXOL 300 MG/ML  SOLN
100.0000 mL | Freq: Once | INTRAMUSCULAR | Status: AC | PRN
  Administered 2013-09-14: 100 mL via INTRAVENOUS

## 2013-09-14 NOTE — ED Notes (Signed)
Pt states she has problems with constipation, c/o abd pain, cramping in nature. Last BM was today but not normal for pt.

## 2013-09-14 NOTE — ED Provider Notes (Signed)
CSN: [id number]     Arrival date & time 09/14/13  1730 History   First MD Initiated Contact with Patient 09/14/13 1752     Chief Complaint  Patient presents with  . Abdominal Pain      HPI Patient presents emergency department with increased bloating in constipation feeling the last few days.  She did have a bowel movement today but she says it was small and hard.  She's had no vomiting. Past Medical History  Diagnosis Date  . Diabetes mellitus   . MVP (mitral valve prolapse)   . Suppurative hidradenitis 07/27/2013  . UTI (lower urinary tract infection) 07/27/2013  . Abdominal pain, unspecified site 08/24/2013  . Dyspnea 08/24/2013   Past Surgical History  Procedure Laterality Date  . Hernia repair    . Cholecystectomy    . Cesarean section    . Cesarean section with bilateral tubal ligation     Family History  Problem Relation Age of Onset  . Diabetes Paternal Grandmother   . Heart disease Paternal Aunt   . Clotting disorder Mother   . Cancer Maternal Aunt     stomach  . Cancer Maternal Aunt     colon  . Cancer Maternal Uncle     prostate   History  Substance Use Topics  . Smoking status: Current Every Day Smoker -- 0.20 packs/day for 20 years    Types: Cigarettes  . Smokeless tobacco: Never Used     Comment: currently smoking 3 cigs/day.  . Alcohol Use: No   OB History   Grav Para Term Preterm Abortions TAB SAB Ect Mult Living   4 3 3  1 1  0 0 0 3     Review of Systems  All other systems reviewed and are negative.      Allergies  Doxycycline hyclate  Home Medications   Current Outpatient Rx  Name  Route  Sig  Dispense  Refill  . amoxicillin-clavulanate (AUGMENTIN) 875-125 MG per tablet   Oral   Take 1 tablet by mouth 2 (two) times daily.   14 tablet   0   . diltiazem (CARDIZEM CD) 180 MG 24 hr capsule   Oral   Take 180 mg by mouth daily.          . fenofibrate (TRICOR) 48 MG tablet   Oral   Take 48 mg by mouth daily.         .  fluticasone (FLONASE) 50 MCG/ACT nasal spray   Each Nare   Place 2 sprays into both nostrils daily.   16 g   4   . glimepiride (AMARYL) 4 MG tablet   Oral   Take 8 mg by mouth daily with breakfast.          . metFORMIN (GLUCOPHAGE) 1000 MG tablet   Oral   Take 1 tablet (1,000 mg total) by mouth 2 (two) times daily with a meal.   60 tablet   4   . predniSONE (DELTASONE) 10 MG tablet      Take 4 for two days three for two days two for two days one for two days   20 tablet   0   . [facility] NTS STEP 1 21 MG/24HR patch   Transdermal   Place 21 mg onto the skin daily.          . polyethylene glycol (MIRALAX / GLYCOLAX) packet      Take drink 2 glasses daily until large bowel movement in drink one  glass daily   14 each   0    BP 146/81  Pulse 82  Temp(Src) 98.7 F (37.1 C) (Oral)  Resp 20  SpO2 97% Physical Exam  Nursing note and vitals reviewed. Constitutional: She is oriented to person, place, and time. She appears well-developed and well-nourished. No distress.  HENT:  Head: Normocephalic and atraumatic.  Eyes: Pupils are equal, round, and reactive to light.  Neck: Normal range of motion.  Cardiovascular: Normal rate and intact distal pulses.   Pulmonary/Chest: No respiratory distress.  Abdominal: Soft. Normal appearance and bowel sounds are normal. She exhibits no distension. There is no tenderness. There is no rebound and no guarding.  Musculoskeletal: Normal range of motion.  Neurological: She is alert and oriented to person, place, and time. No cranial nerve deficit.  Skin: Skin is warm and dry. No rash noted.  Psychiatric: She has a normal mood and affect. Her behavior is normal.    ED Course  Procedures (including critical care time) Labs Review Labs Reviewed  CBC WITH DIFFERENTIAL - Abnormal; Notable for the following:    WBC 15.7 (*)    Hemoglobin 11.9 (*)    MCV 72.8 (*)    MCH 23.5 (*)    Neutro Abs 10.6 (*)    Lymphs Abs 4.1 (*)    All other  components within normal limits  COMPREHENSIVE METABOLIC PANEL - Abnormal; Notable for the following:    Glucose, Bld 133 (*)    Albumin 3.1 (*)    Total Bilirubin 0.2 (*)    All other components within normal limits  URINALYSIS, ROUTINE W REFLEX MICROSCOPIC - Abnormal; Notable for the following:    Leukocytes, UA MODERATE (*)    All other components within normal limits  LIPASE, BLOOD  URINE MICROSCOPIC-ADD ON   Imaging Review Dg Abd 1 View  09/14/2013   CLINICAL DATA:  Abdominal pain.  EXAM: ABDOMEN - 1 VIEW  COMPARISON:  Bowel/ 15/11  FINDINGS: Mild increased stool but colon. Bowel gas pattern is otherwise unremarkable. No obstruction.  Changes from prior cholecystectomy are stable.  Soft tissues are otherwise unremarkable. No significant bony abnormality.  IMPRESSION: No acute findings.  Mild increased stool in the colon.   Electronically Signed   By: Lajean Manes M.D.   On: 09/14/2013 19:13   Ct Abdomen Pelvis W Contrast  09/14/2013   CLINICAL DATA:  Abdominal pain, bloating and constipation.  EXAM: CT ABDOMEN AND PELVIS WITH CONTRAST  TECHNIQUE: Multidetector CT imaging of the abdomen and pelvis was performed using the standard protocol following bolus administration of intravenous contrast.  CONTRAST:  27mL OMNIPAQUE IOHEXOL 300 MG/ML SOLN, 176mL OMNIPAQUE IOHEXOL 300 MG/ML SOLN  COMPARISON:  None.  FINDINGS: Lung bases are unremarkable.  Abdominal images demonstrate minimal fatty infiltration of the liver without focal mass. There is evidence of a prior cholecystectomy. The spleen, pancreas and adrenal glands are within normal. Kidneys are within normal without evidence of hydronephrosis or nephrolithiasis. There is a sub cm hypodensity over the mid pole of the right kidney too small to characterize but likely a cyst. Ureters are normal. There is mild diverticulosis throughout the colon. There is mild fecal retention throughout the colon. There is a small metallic density over the mesentery  of the anterior mid abdomen. The appendix is within normal. There are postsurgical changes over the midline anterior lower abdominal wall. There is no free fluid or focal inflammatory change.  Pelvic images demonstrate normal bladder, uterus and rectosigmoid colon. There  is minimal sclerosis adjacent the sacroiliac joints right worse than left.  IMPRESSION: No acute findings in the abdomen/pelvis.  Minimal fatty infiltration of the liver.  Minimal colonic diverticulosis.  Sub cm right renal hypodensity likely a cyst, but too small to characterize.   Electronically Signed   By: Marin Olp M.D.   On: 09/14/2013 21:04      MDM   Final diagnoses:  Constipation        Dot Lanes, MD 09/14/13 2151

## 2013-09-14 NOTE — Discharge Instructions (Signed)
Constipation, Adult Constipation is when a person has fewer than 3 bowel movements a week; has difficulty having a bowel movement; or has stools that are dry, hard, or larger than normal. As people grow older, constipation is more common. If you try to fix constipation with medicines that make you have a bowel movement (laxatives), the problem may get worse. Long-term laxative use may cause the muscles of the colon to become weak. A low-fiber diet, not taking in enough fluids, and taking certain medicines may make constipation worse. CAUSES   Certain medicines, such as antidepressants, pain medicine, iron supplements, antacids, and water pills.   Certain diseases, such as diabetes, irritable bowel syndrome (IBS), thyroid disease, or depression.   Not drinking enough water.   Not eating enough fiber-rich foods.   Stress or travel.  Lack of physical activity or exercise.  Not going to the restroom when there is the urge to have a bowel movement.  Ignoring the urge to have a bowel movement.  Using laxatives too much. SYMPTOMS   Having fewer than 3 bowel movements a week.   Straining to have a bowel movement.   Having hard, dry, or larger than normal stools.   Feeling full or bloated.   Pain in the lower abdomen.  Not feeling relief after having a bowel movement. DIAGNOSIS  Your caregiver will take a medical history and perform a physical exam. Further testing may be done for severe constipation. Some tests may include:   A barium enema X-ray to examine your rectum, colon, and sometimes, your small intestine.  A sigmoidoscopy to examine your lower colon.  A colonoscopy to examine your entire colon. TREATMENT  Treatment will depend on the severity of your constipation and what is causing it. Some dietary treatments include drinking more fluids and eating more fiber-rich foods. Lifestyle treatments may include regular exercise. If these diet and lifestyle recommendations  do not help, your caregiver may recommend taking over-the-counter laxative medicines to help you have bowel movements. Prescription medicines may be prescribed if over-the-counter medicines do not work.  HOME CARE INSTRUCTIONS   Increase dietary fiber in your diet, such as fruits, vegetables, whole grains, and beans. Limit high-fat and processed sugars in your diet, such as [place] fries, hamburgers, cookies, candies, and soda.   A fiber supplement may be added to your diet if you cannot get enough fiber from foods.   Drink enough fluids to keep your urine clear or pale yellow.   Exercise regularly or as directed by your caregiver.   Go to the restroom when you have the urge to go. Do not hold it.  Only take medicines as directed by your caregiver. Do not take other medicines for constipation without talking to your caregiver first. [place] IF:   You have bright red blood in your stool.   Your constipation lasts for more than 4 days or gets worse.   You have abdominal or rectal pain.   You have thin, pencil-like stools.  You have unexplained weight loss. MAKE SURE YOU:   Understand these instructions.  Will watch your condition.  Will get help right away if you are not doing well or get worse. Document Released: 02/24/2004 Document Revised: 08/20/2011 Document Reviewed: 03/09/2013 [facility] Patient Information 2014 [place], [place].  Fiber Content in Foods Drinking plenty of fluids and consuming foods high in fiber can help with constipation. See the list below for the fiber content of some common foods. Starches and Grains / Dietary  Fiber (g)  Cheerios, 1 cup / 3 g  Kellogg's Corn Flakes, 1 cup / 0.7 g  Rice Krispies, 1  cup / 0.3 g  Quaker Oat Life Cereal,  cup / 2.1 g  Oatmeal, instant (cooked),  cup / 2 g  Kellogg's Frosted Mini Wheats, 1 cup / 5.1 g  Rice, brown, long-grain (cooked), 1 cup / 3.5 g  Rice, white, long-grain (cooked),  1 cup / 0.6 g  Macaroni, cooked, enriched, 1 cup / 2.5 g Legumes / Dietary Fiber (g)  Beans, baked, canned, plain or vegetarian,  cup / 5.2 g  Beans, kidney, canned,  cup / 6.8 g  Beans, pinto, dried (cooked),  cup / 7.7 g  Beans, pinto, canned,  cup / 5.5 g Breads and Crackers / Dietary Fiber (g)  Graham crackers, plain or honey, 2 squares / 0.7 g  Saltine crackers, 3 squares / 0.3 g  Pretzels, plain, salted, 10 pieces / 1.8 g  Bread, whole-wheat, 1 slice / 1.9 g  Bread, white, 1 slice / 0.7 g  Bread, raisin, 1 slice / 1.2 g  Bagel, plain, 3 oz / 2 g  Tortilla, flour, 1 oz / 0.9 g  Tortilla, corn, 1 small / 1.5 g  Bun, hamburger or hotdog, 1 small / 0.9 g Fruits / Dietary Fiber (g)  Apple, raw with skin, 1 medium / 4.4 g  Applesauce, sweetened,  cup / 1.5 g  Banana,  medium / 1.5 g  Grapes, 10 grapes / 0.4 g  Orange, 1 small / 2.3 g  Raisin, 1.5 oz / 1.6 g  Melon, 1 cup / 1.4 g Vegetables / Dietary Fiber (g)  Green beans, canned,  cup / 1.3 g  Carrots (cooked),  cup / 2.3 g  Broccoli (cooked),  cup / 2.8 g  Peas, frozen (cooked),  cup / 4.4 g  Potatoes, mashed,  cup / 1.6 g  Lettuce, 1 cup / 0.5 g  Corn, canned,  cup / 1.6 g  Tomato,  cup / 1.1 g Document Released: 10/14/2006 Document Revised: 08/20/2011 Document Reviewed: 12/09/2006 [employer] Patient Information 2014 [place], [place].

## 2013-09-16 ENCOUNTER — Ambulatory Visit (INDEPENDENT_AMBULATORY_CARE_PROVIDER_SITE_OTHER): Payer: No Typology Code available for payment source | Admitting: Critical Care Medicine

## 2013-09-16 ENCOUNTER — Telehealth: Payer: Self-pay | Admitting: Critical Care Medicine

## 2013-09-16 DIAGNOSIS — R0609 Other forms of dyspnea: Secondary | ICD-10-CM

## 2013-09-16 DIAGNOSIS — R0989 Other specified symptoms and signs involving the circulatory and respiratory systems: Secondary | ICD-10-CM

## 2013-09-16 DIAGNOSIS — R0600 Dyspnea, unspecified: Secondary | ICD-10-CM

## 2013-09-16 LAB — PULMONARY FUNCTION TEST
DL/VA % pred: 88 %
DL/VA: 4.59 ml/min/mmHg/L
DLCO unc % pred: 51 %
DLCO unc: 14.97 ml/min/mmHg
FEF 25-75 Post: 5.19 L/sec
FEF 25-75 Pre: 4.22 L/sec
FEF2575-%Change-Post: 22 %
FEF2575-%Pred-Post: 193 %
FEF2575-%Pred-Pre: 157 %
FEV1-%Change-Post: 6 %
FEV1-%PRED-POST: 83 %
FEV1-%Pred-Pre: 78 %
FEV1-Post: 2.2 L
FEV1-Pre: 2.07 L
FEV1FVC-%Change-Post: 2 %
FEV1FVC-%Pred-Pre: 113 %
FEV6-%Change-Post: 3 %
FEV6-%PRED-PRE: 70 %
FEV6-%Pred-Post: 73 %
FEV6-POST: 2.34 L
FEV6-Pre: 2.26 L
FEV6FVC-%PRED-POST: 103 %
FEV6FVC-%Pred-Pre: 103 %
FVC-%CHANGE-POST: 3 %
FVC-%PRED-PRE: 68 %
FVC-%Pred-Post: 71 %
FVC-POST: 2.34 L
FVC-PRE: 2.26 L
PRE FEV6/FVC RATIO: 100 %
Post FEV1/FVC ratio: 94 %
Post FEV6/FVC ratio: 100 %
Pre FEV1/FVC ratio: 92 %
RV % pred: 60 %
RV: 1.18 L
TLC % pred: 67 %
TLC: 3.78 L

## 2013-09-16 NOTE — Telephone Encounter (Signed)
SPOKE TO PT SHE IS AWARE ALICE STUDIES ARE ABOUT 3WKS OUT AND SHE WILL GET A CALL AS SOON AS ONE IS AVAILABLE FOR HER TO USE [facility]

## 2013-09-16 NOTE — Progress Notes (Signed)
PFT done today. 

## 2013-09-16 NOTE — Telephone Encounter (Signed)
Per OV 3/30/15l Patient Instructions      A home sleep study will be obtained A pulmonary function study will be obtained Start flonase two puff ea nostril daily Start prednisone 10mg  Take 4 for two days three for two days two for two days one for two days Start augmentin one twice daily for 7days A chest xray will be obtained Stop smoking, use nicorette minis 4mg    Return 6 weeks  --  Order fro home sleep test is in EPIC. Please advise PCC's thanks

## 2013-09-23 ENCOUNTER — Ambulatory Visit (INDEPENDENT_AMBULATORY_CARE_PROVIDER_SITE_OTHER): Payer: No Typology Code available for payment source | Admitting: Gastroenterology

## 2013-09-23 ENCOUNTER — Encounter: Payer: Self-pay | Admitting: Gastroenterology

## 2013-09-23 VITALS — BP 120/80 | HR 80 | Ht 67.32 in | Wt 278.5 lb

## 2013-09-23 DIAGNOSIS — K6289 Other specified diseases of anus and rectum: Secondary | ICD-10-CM

## 2013-09-23 DIAGNOSIS — R109 Unspecified abdominal pain: Secondary | ICD-10-CM

## 2013-09-23 DIAGNOSIS — D508 Other iron deficiency anemias: Secondary | ICD-10-CM

## 2013-09-23 DIAGNOSIS — K5900 Constipation, unspecified: Secondary | ICD-10-CM

## 2013-09-23 MED ORDER — NA SULFATE-K SULFATE-MG SULF 17.5-3.13-1.6 GM/177ML PO SOLN: 1.0000 | Freq: Once | ORAL | Status: DC

## 2013-09-23 NOTE — Assessment & Plan Note (Signed)
She complains of rectal irritation which could be do to internal hemorrhoids.  She also has a large external tag.  Recommendations #1 hemorrhoidal suppositories #2 evaluate for hemorrhoids at colonoscopy #3 to consider band ligation and/or removal of anal tag pending response to hemorrhoidal suppositories

## 2013-09-23 NOTE — Assessment & Plan Note (Signed)
Constipation is probably idiopathic.  A structural abnormality of the colon should be ruled out.  Recommendations #1 colonoscopy

## 2013-09-23 NOTE — Patient Instructions (Addendum)
You have been given a separate informational sheet regarding your tobacco use, the importance of quitting and local resources to help you quit. You have been scheduled for a colonoscopy with propofol. Please follow written instructions given to you at your visit today.  Please pick up your prep kit at the pharmacy within the next 1-3 days. If you use inhalers (even only as needed), please bring them with you on the day of your procedure. Your physician has requested that you go to www.startemmi.com and enter the access code given to you at your visit today. This web site gives a general overview about your procedure. However, you should still follow specific instructions given to you by our office regarding your preparation for the procedure.  Use an OTC hemorrhoidal suppository as needed

## 2013-09-23 NOTE — Progress Notes (Signed)
_                                                                                                                History of Present Illness: 53 year old Afro-American female referred for evaluation of abdominal pain and constipation.  Constipation has been a worsening problem over the past year.  She may go 2-3 days without a bowel movement.  With constipation she may develop abdominal bloating and discomfort.  She was seen recently in the ED for this.  CT scan was unremarkable.  She complains of rectal irritation.  She has a history of an iron deficiency anemia.  She has not had a period for over a year.  She denies rectal bleeding.    Past Medical History  Diagnosis Date  . Diabetes mellitus   . MVP (mitral valve prolapse)   . Suppurative hidradenitis 07/27/2013  . UTI (lower urinary tract infection) 07/27/2013  . Abdominal pain, unspecified site 08/24/2013  . Dyspnea 08/24/2013  . HLD (hyperlipidemia)   . Sinusitis    Past Surgical History  Procedure Laterality Date  . Hernia repair    . Cholecystectomy    . Cesarean section    . Cesarean section with bilateral tubal ligation    . Umbilical hernia repair     family history includes Clotting disorder in her mother; Colon cancer in her maternal aunt; Coronary artery disease in her paternal grandfather; Diabetes in her paternal grandmother; Heart disease in her paternal aunt and paternal grandfather; Lung cancer in her maternal uncle; Prostate cancer in her maternal uncle; Stomach cancer in her maternal aunt. Current Outpatient Prescriptions  Medication Sig Dispense Refill  . CVS NTS STEP 1 21 MG/24HR patch Place 21 mg onto the skin daily.       Marland Kitchen diltiazem (CARDIZEM CD) 180 MG 24 hr capsule Take 180 mg by mouth daily.       . fenofibrate (TRICOR) 48 MG tablet Take 48 mg by mouth daily.      . fluticasone (FLONASE) 50 MCG/ACT nasal spray Place 2 sprays into both nostrils daily.  16 g  4  . glimepiride (AMARYL) 4 MG  tablet Take 8 mg by mouth daily with breakfast.       . metFORMIN (GLUCOPHAGE) 1000 MG tablet Take 1 tablet (1,000 mg total) by mouth 2 (two) times daily with a meal.  60 tablet  4  . polyethylene glycol (MIRALAX / GLYCOLAX) packet Take drink 2 glasses daily until large bowel movement in drink one glass daily  14 each  0  . [DISCONTINUED] cetirizine (ZYRTEC) 10 MG tablet Take 1 tablet (10 mg total) by mouth daily.  30 tablet  4   No current facility-administered medications for this visit.   Allergies as of 09/23/2013 - Review Complete 09/23/2013  Allergen Reaction Noted  . Doxycycline hyclate  01/06/2010    reports that she has been smoking Cigarettes.  She has a 4 pack-year smoking history. She has never used smokeless tobacco. She reports that she  does not drink alcohol or use illicit drugs.     Review of Systems: She's been told that she is been anemic and has had some problem with both her white cells" .  Pertinent positive and negative review of systems were noted in the above HPI section. All other review of systems were otherwise negative.  Vital signs were reviewed in today's medical record Physical Exam: General: Well developed , well nourished, no acute distress Skin: anicteric Head: Normocephalic and atraumatic Eyes:  sclerae anicteric, EOMI Ears: Normal auditory acuity Mouth: No deformity or lesions Neck: Supple, no masses or thyromegaly Lungs: Clear throughout to auscultation Heart: Regular rate and rhythm; no murmurs, rubs or bruits Abdomen: Soft, non tender and non distended. No masses, hepatosplenomegaly or hernias noted. Normal Bowel sounds Rectal: There is a large external skin tag Musculoskeletal: Symmetrical with no gross deformities  Skin: No lesions on visible extremities Pulses:  Normal pulses noted Extremities: No clubbing, cyanosis, edema or deformities noted Neurological: Alert oriented x 4, grossly nonfocal Cervical Nodes:  No significant cervical  adenopathy Inguinal Nodes: No significant inguinal adenopathy Psychological:  Alert and cooperative. Normal mood and affect  See Assessment and Plan under Problem List

## 2013-09-23 NOTE — Assessment & Plan Note (Signed)
Patient has a microcytic anemia without obvious GI blood loss or history of menorrhagia  Recommendations #1 colonoscopy

## 2013-09-23 NOTE — Assessment & Plan Note (Signed)
Abdominal bloating and pain are likely related to constipation.  Recent CT scan was negative for active intra-abdominal disease.

## 2013-09-29 ENCOUNTER — Encounter: Payer: Self-pay | Admitting: Critical Care Medicine

## 2013-09-29 ENCOUNTER — Telehealth: Payer: Self-pay | Admitting: Critical Care Medicine

## 2013-09-29 NOTE — Telephone Encounter (Signed)
I called spoke with pt. She reports she is suppose to have home sleep study and has not been called about this yet. I advised will get with our PCC's to see. Please advise PCC's thanks  Also pt would like PFT results from 09/16/13. Please advise Dr. Joya Gaskins thanks

## 2013-09-29 NOTE — Telephone Encounter (Signed)
I called and made pt aware of results Please advise PCC's regarding sleep study as pt going on vacation 10/15/13 and appt with PW 10/14/13 thanks

## 2013-09-29 NOTE — Telephone Encounter (Signed)
pfts show mild to moderate obstruction. Key will be to stop smoking. pls get home sleep study done, was to have been done already in our lab

## 2013-09-30 NOTE — Telephone Encounter (Signed)
Lmtcb Joellen Jersey

## 2013-10-01 NOTE — Telephone Encounter (Signed)
Spoke to pt she is aware she will be called within the next few days to get set up with [facility] Joellen Jersey

## 2013-10-02 DIAGNOSIS — G4733 Obstructive sleep apnea (adult) (pediatric): Secondary | ICD-10-CM

## 2013-10-05 ENCOUNTER — Encounter: Payer: Self-pay | Admitting: Critical Care Medicine

## 2013-10-05 DIAGNOSIS — G4730 Sleep apnea, unspecified: Secondary | ICD-10-CM

## 2013-10-05 DIAGNOSIS — G4710 Hypersomnia, unspecified: Secondary | ICD-10-CM

## 2013-10-07 ENCOUNTER — Ambulatory Visit (INDEPENDENT_AMBULATORY_CARE_PROVIDER_SITE_OTHER): Payer: No Typology Code available for payment source | Admitting: Critical Care Medicine

## 2013-10-07 ENCOUNTER — Encounter: Payer: Self-pay | Admitting: Critical Care Medicine

## 2013-10-07 VITALS — BP 124/78 | HR 84 | Temp 98.7°F | Ht 67.5 in | Wt 281.2 lb

## 2013-10-07 DIAGNOSIS — G4733 Obstructive sleep apnea (adult) (pediatric): Secondary | ICD-10-CM

## 2013-10-07 DIAGNOSIS — J441 Chronic obstructive pulmonary disease with (acute) exacerbation: Secondary | ICD-10-CM

## 2013-10-07 DIAGNOSIS — F17200 Nicotine dependence, unspecified, uncomplicated: Secondary | ICD-10-CM

## 2013-10-07 MED ORDER — ALBUTEROL SULFATE HFA 108 (90 BASE) MCG/ACT IN AERS: 2.0000 | INHALATION_SPRAY | Freq: Four times a day (QID) | RESPIRATORY_TRACT | Status: AC | PRN

## 2013-10-07 NOTE — Patient Instructions (Signed)
Use flonase daily A cpap machine will be ordered from [facility] No other medication changes Stop smoking  Return 3 months

## 2013-10-07 NOTE — Assessment & Plan Note (Signed)
Obstructive sleep apnea mild to moderate on home sleep study Plan Begin CPAP 5-10 cm water pressure on AutoSet device mask of choice review download in 2 weeks

## 2013-10-07 NOTE — Progress Notes (Signed)
Quick Note:  Pt was seen today for OV. Sleep study results and recs were discuss with pt by PW. Order was placed for cpap during OV. Pt aware. ______

## 2013-10-07 NOTE — Progress Notes (Signed)
Subjective:    Patient ID: Breanna Berger, female    DOB: 1960-11-23, 53 y.o.   MRN: [id number]  HPI  10/07/2013 Chief Complaint  Patient presents with  . 6 wk follow up    DOE is unchanged.  Has chest tightness at times, nasl congestion - worse in the mornings, and itchy eyes.  Still snoring.     Dyspnea and cough is better. Still with nasal congestion.  Itching eyes, and nose.  Min cough.  No mucus seen. Sleep study pos for sleep apnea. Pt still smoking .   Pt has patches and lozenges for nicotine.     Review of Systems  Constitutional: Negative for unexpected weight change.  HENT: Positive for congestion, postnasal drip and sinus pressure. Negative for dental problem, nosebleeds, sneezing and trouble swallowing.   Eyes: Positive for itching. Negative for redness.  Respiratory: Negative for cough and chest tightness.   Cardiovascular: Negative for palpitations.  Gastrointestinal: Negative for nausea.  Genitourinary: Negative for dysuria.  Musculoskeletal: Negative for joint swelling.  Hematological: Does not bruise/bleed easily.  Psychiatric/Behavioral: Negative for dysphoric mood. The patient is not nervous/anxious.        Objective:   Physical Exam  Filed Vitals:   10/07/13 0945  BP: 124/78  Pulse: 84  Temp: 98.7 F (37.1 C)  TempSrc: Oral  Height: 5' 7.5" (1.715 m)  Weight: 281 lb 3.2 oz (127.551 kg)  SpO2: 99%    Gen: Pleasant, well-nourished, in no distress,  normal affect  ENT: No lesions,  mouth clear,  oropharynx clear, no postnasal drip  Neck: No JVD, no TMG, no carotid bruits  Lungs: No use of accessory muscles, no dullness to percussion, no  wheeze  Cardiovascular: RRR, heart sounds normal, no murmur or gallops, no peripheral edema  Abdomen: soft and NT, no HSM,  BS normal  Musculoskeletal: No deformities, no cyanosis or clubbing  Neuro: alert, non focal  Skin: Warm, no lesions or rashes  No results found.        Assessment & Plan:    Obstructive chronic bronchitis with exacerbation Chronic obstructive airways disease with ongoing tobacco use Mild improvement Plan No other medication changes, continue albuterol as needed and no other inhaled medications were prescribed Stop smoking  Return 3 months   TOBACCO ABUSE Ongoing tobacco use Plan Smoking cessation was advised in 3-10 minutes of smoking cessation counseling was issued to the patient   OSA (obstructive sleep apnea) Obstructive sleep apnea mild to moderate on home sleep study Plan Begin CPAP 5-10 cm water pressure on AutoSet device mask of choice review download in 2 weeks    Updated Medication List Outpatient Encounter Prescriptions as of 10/07/2013  Medication Sig  . albuterol (PROVENTIL HFA;VENTOLIN HFA) 108 (90 BASE) MCG/ACT inhaler Inhale 2 puffs into the lungs every 6 (six) hours as needed for wheezing or shortness of breath.  . CVS NTS STEP 1 21 MG/24HR patch Place 21 mg onto the skin daily.   Marland Kitchen diltiazem (CARDIZEM CD) 180 MG 24 hr capsule Take 180 mg by mouth daily.   . fenofibrate (TRICOR) 48 MG tablet Take 48 mg by mouth daily.  . fluticasone (FLONASE) 50 MCG/ACT nasal spray Place 2 sprays into both nostrils daily as needed.  Marland Kitchen glimepiride (AMARYL) 4 MG tablet Take 8 mg by mouth daily with breakfast.   . metFORMIN (GLUCOPHAGE) 1000 MG tablet Take 1 tablet (1,000 mg total) by mouth 2 (two) times daily with a meal.  . polyethylene glycol (MIRALAX /  GLYCOLAX) packet Take drink 2 glasses daily until large bowel movement in drink one glass daily  . [DISCONTINUED] albuterol (PROVENTIL HFA;VENTOLIN HFA) 108 (90 BASE) MCG/ACT inhaler Inhale 2 puffs into the lungs at bedtime as needed for wheezing or shortness of breath.  . [DISCONTINUED] fluticasone (FLONASE) 50 MCG/ACT nasal spray Place 2 sprays into both nostrils daily.  . Na Sulfate-K Sulfate-Mg Sulf (SUPREP BOWEL PREP) SOLN Take 1 kit by mouth once.

## 2013-10-07 NOTE — Assessment & Plan Note (Signed)
Chronic obstructive airways disease with ongoing tobacco use Mild improvement Plan No other medication changes, continue albuterol as needed and no other inhaled medications were prescribed Stop smoking  Return 3 months

## 2013-10-07 NOTE — Assessment & Plan Note (Signed)
Ongoing tobacco use Plan Smoking cessation was advised in 3-10 minutes of smoking cessation counseling was issued to the patient

## 2013-10-14 ENCOUNTER — Ambulatory Visit: Payer: No Typology Code available for payment source | Admitting: Critical Care Medicine

## 2013-10-20 ENCOUNTER — Encounter: Payer: Self-pay | Admitting: Gastroenterology

## 2013-11-03 ENCOUNTER — Encounter: Payer: No Typology Code available for payment source | Admitting: Gastroenterology

## 2013-12-09 ENCOUNTER — Other Ambulatory Visit: Payer: Self-pay | Admitting: *Deleted

## 2013-12-09 DIAGNOSIS — E119 Type 2 diabetes mellitus without complications: Secondary | ICD-10-CM

## 2013-12-09 MED ORDER — METFORMIN HCL 1000 MG PO TABS: 1000.0000 mg | ORAL_TABLET | Freq: Two times a day (BID) | ORAL | Status: DC

## 2014-01-04 ENCOUNTER — Encounter: Payer: No Typology Code available for payment source | Admitting: Gastroenterology

## 2014-01-18 ENCOUNTER — Other Ambulatory Visit: Payer: Self-pay | Admitting: Internal Medicine

## 2014-04-06 ENCOUNTER — Encounter (HOSPITAL_COMMUNITY): Payer: Self-pay

## 2014-04-12 ENCOUNTER — Encounter: Payer: Self-pay | Admitting: Critical Care Medicine

## 2014-09-09 ENCOUNTER — Emergency Department (HOSPITAL_COMMUNITY)
Admission: EM | Admit: 2014-09-09 | Discharge: 2014-09-10 | Disposition: A | Discharge status: Home / Self Care | Payer: 59 | Attending: Emergency Medicine | Admitting: Emergency Medicine

## 2014-09-09 ENCOUNTER — Emergency Department (HOSPITAL_COMMUNITY): Payer: 59

## 2014-09-09 ENCOUNTER — Encounter (HOSPITAL_COMMUNITY): Payer: Self-pay | Admitting: *Deleted

## 2014-09-09 DIAGNOSIS — E119 Type 2 diabetes mellitus without complications: Secondary | ICD-10-CM | POA: Diagnosis not present

## 2014-09-09 DIAGNOSIS — Z87440 Personal history of urinary (tract) infections: Secondary | ICD-10-CM | POA: Insufficient documentation

## 2014-09-09 DIAGNOSIS — Z720 Tobacco use: Secondary | ICD-10-CM | POA: Diagnosis not present

## 2014-09-09 DIAGNOSIS — Z872 Personal history of diseases of the skin and subcutaneous tissue: Secondary | ICD-10-CM | POA: Diagnosis not present

## 2014-09-09 DIAGNOSIS — I471 Supraventricular tachycardia, unspecified: Secondary | ICD-10-CM

## 2014-09-09 DIAGNOSIS — Z79899 Other long term (current) drug therapy: Secondary | ICD-10-CM | POA: Diagnosis not present

## 2014-09-09 DIAGNOSIS — Z7951 Long term (current) use of inhaled steroids: Secondary | ICD-10-CM | POA: Diagnosis not present

## 2014-09-09 DIAGNOSIS — Z8709 Personal history of other diseases of the respiratory system: Secondary | ICD-10-CM | POA: Insufficient documentation

## 2014-09-09 DIAGNOSIS — R079 Chest pain, unspecified: Secondary | ICD-10-CM | POA: Diagnosis present

## 2014-09-09 HISTORY — DX: Supraventricular tachycardia: I47.1

## 2014-09-09 HISTORY — DX: Supraventricular tachycardia, unspecified: I47.10

## 2014-09-09 LAB — CBC WITH DIFFERENTIAL/PLATELET
BASOS PCT: 0 % (ref 0–1)
Basophils Absolute: 0 10*3/uL (ref 0.0–0.1)
Eosinophils Absolute: 0.3 10*3/uL (ref 0.0–0.7)
Eosinophils Relative: 2 % (ref 0–5)
HCT: 44.2 % (ref 36.0–46.0)
HEMOGLOBIN: 14.6 g/dL (ref 12.0–15.0)
Lymphocytes Relative: 33 % (ref 12–46)
Lymphs Abs: 5.2 10*3/uL — ABNORMAL HIGH (ref 0.7–4.0)
MCH: 24.4 pg — ABNORMAL LOW (ref 26.0–34.0)
MCHC: 33 g/dL (ref 30.0–36.0)
MCV: 73.8 fL — ABNORMAL LOW (ref 78.0–100.0)
MONOS PCT: 4 % (ref 3–12)
Monocytes Absolute: 0.6 10*3/uL (ref 0.1–1.0)
NEUTROS ABS: 9.9 10*3/uL — AB (ref 1.7–7.7)
NEUTROS PCT: 62 % (ref 43–77)
Platelets: 242 10*3/uL (ref 150–400)
RBC: 5.99 MIL/uL — AB (ref 3.87–5.11)
RDW: 14.5 % (ref 11.5–15.5)
WBC: 16.1 10*3/uL — ABNORMAL HIGH (ref 4.0–10.5)

## 2014-09-09 LAB — BASIC METABOLIC PANEL
Anion gap: 13 (ref 5–15)
BUN: 9 mg/dL (ref 6–23)
CALCIUM: 9.5 mg/dL (ref 8.4–10.5)
CO2: 24 mmol/L (ref 19–32)
Chloride: 99 mmol/L (ref 96–112)
Creatinine, Ser: 0.81 mg/dL (ref 0.50–1.10)
GFR calc Af Amer: >90 mL/min (ref >90)
GFR calc non Af Amer: 83 mL/min — ABNORMAL LOW (ref >90)
Glucose, Bld: 325 mg/dL — ABNORMAL HIGH (ref 70–99)
POTASSIUM: 4.3 mmol/L (ref 3.5–5.1)
SODIUM: 136 mmol/L (ref 135–145)

## 2014-09-09 LAB — I-STAT CHEM 8, ED
BUN: 11 mg/dL (ref 6–23)
CALCIUM ION: 1.17 mmol/L (ref 1.12–1.23)
Chloride: 99 mmol/L (ref 96–112)
Creatinine, Ser: 0.7 mg/dL (ref 0.50–1.10)
GLUCOSE: 340 mg/dL — AB (ref 70–99)
HCT: 51 % — ABNORMAL HIGH (ref 36.0–46.0)
Hemoglobin: 17.3 g/dL — ABNORMAL HIGH (ref 12.0–15.0)
Potassium: 4 mmol/L (ref 3.5–5.1)
Sodium: 138 mmol/L (ref 135–145)
TCO2: 23 mmol/L (ref 0–100)

## 2014-09-09 LAB — I-STAT TROPONIN, ED: TROPONIN I, POC: 0 ng/mL (ref 0.00–0.08)

## 2014-09-09 MED ORDER — SODIUM CHLORIDE 0.9 % IV BOLUS (SEPSIS)
1000.0000 mL | Freq: Once | INTRAVENOUS | Status: AC
  Administered 2014-09-09: 1000 mL via INTRAVENOUS

## 2014-09-09 MED ORDER — METOPROLOL TARTRATE 1 MG/ML IV SOLN
5.0000 mg | Freq: Once | INTRAVENOUS | Status: AC
  Administered 2014-09-09: 5 mg via INTRAVENOUS
  Filled 2014-09-09: qty 5

## 2014-09-09 NOTE — Discharge Instructions (Signed)
Supraventricular Tachycardia °Supraventricular tachycardia (SVT) is an abnormal heart rhythm (arrhythmia) that causes the heart to beat very fast (tachycardia). This kind of fast heartbeat originates in the upper chambers of the heart (atria). SVT can cause the heart to beat greater than 100 beats per minute. SVT can have a rapid burst of heartbeats. This can start and stop suddenly without warning and is called nonsustained. SVT can also be sustained, in which the heart beats at a continuous fast rate.  °CAUSES  °There can be different causes of SVT. Some of these include: °· Heart valve problems such as mitral valve prolapse. °· An enlarged heart (hypertrophic cardiomyopathy). °· Congenital heart problems. °· Heart inflammation (pericarditis). °· Hyperthyroidism. °· Low potassium or magnesium levels. °· Caffeine. °· Drug use such as cocaine, methamphetamines, or stimulants. °· Some over-the-counter medicines such as: °¨ Decongestants. °¨ Diet medicines. °¨ Herbal medicines. °SYMPTOMS  °Symptoms of SVT can vary. Symptoms depend on whether the SVT is sustained or nonsustained. You may experience: °· No symptoms (asymptomatic). °· An awareness of your heart beating rapidly (palpitations). °· Shortness of breath. °· Chest pain or pressure. °If your blood pressure drops because of the SVT, you may experience: °· Fainting or near fainting. °· Weakness. °· Dizziness. °DIAGNOSIS  °Different tests can be performed to diagnose SVT, such as: °· An electrocardiogram (EKG). This is a painless test that records the electrical activity of your heart. °· Holter monitor. This is a 24 hour recording of your heart rhythm. You will be given a diary. Write down all symptoms that you have and what you were doing at the time you experienced symptoms. °· Arrhythmia monitor. This is a small device that your wear for several weeks. It records the heart rhythm when you have symptoms. °· Echocardiogram. This is an imaging test to help detect  abnormal heart structure such as congenital abnormalities, heart valve problems, or heart enlargement. °· Stress test. This test can help determine if the SVT is related to exercise. °· Electrophysiology study (EPS). This is a procedure that evaluates your heart's electrical system and can help your caregiver find the cause of your SVT. °TREATMENT  °Treatment of SVT depends on the symptoms, how often it recurs, and whether there are any underlying heart problems.  °· If symptoms are rare and no other cardiac disease is present, no treatment may be needed. °· Blood work may be done to check potassium, magnesium, and thyroid hormone levels to see if they are abnormal. If these levels are abnormal, treatment to correct the problems will occur. °Medicines °Your caregiver may use oral medicines to treat SVT. These medicines are given for long-term control of SVT. Medicines may be used alone or in combination with other treatments. These medicines work to slow nerve impulses in the heart muscle. These medicines can also be used to treat high blood pressure. Some of these medicines may include: °· Calcium channel blockers. °· Beta blockers. °· Digoxin. °Nonsurgical procedures °Nonsurgical techniques may be used if oral medicines do not work. Some examples include: °· Cardioversion. This technique uses either drugs or an electrical shock to restore a normal heart rhythm. °¨ Cardioversion drugs may be given through an intravenous (IV) line to help "reset" the heart rhythm. °¨ In electrical cardioversion, the caregiver shocks your heart to stop its beat for a split second. This helps to reset the heart to a normal rhythm. °· Ablation. This procedure is done under mild sedation. High frequency radio wave energy is used to   destroy the area of heart tissue responsible for the SVT. °HOME CARE INSTRUCTIONS  °· Do not smoke. °· Only take medicines prescribed by your caregiver. Check with your caregiver before using over-the-counter  medicines. °· Check with your caregiver about how much alcohol and caffeine (coffee, tea, colas, or chocolate) you may have. °· It is very important to keep all follow-up referrals and appointments in order to properly manage this problem. °SEEK IMMEDIATE MEDICAL CARE IF: °· You have dizziness. °· You faint or nearly faint. °· You have shortness of breath. °· You have chest pain or pressure. °· You have sudden nausea or vomiting. °· You have profuse sweating. °· You are concerned about how long your symptoms last. °· You are concerned about the frequency of your SVT episodes. °If you have the above symptoms, call your local emergency services (911 in [place] immediately. Do not drive yourself to the hospital. °MAKE SURE YOU:  °· Understand these instructions. °· Will watch your condition. °· Will get help right away if you are not doing well or get worse. °Document Released: 05/28/2005 Document Revised: 08/20/2011 Document Reviewed: 09/09/2008 °Exit[employer]® Patient Information ©2015 [employer]. This information is not intended to replace advice given to you by your health care provider. Make sure you discuss any questions you have with your health care provider. ° °

## 2014-09-09 NOTE — ED Provider Notes (Signed)
CSN: [id number]     Arrival date & time 09/09/14  2043 History   First MD Initiated Contact with Patient 09/09/14 2052     Chief Complaint  Patient presents with  . Chest Pain  . Palpitations     (Consider location/radiation/quality/duration/timing/severity/associated sxs/prior Treatment) HPI  Breanna Berger is a 54 year old female with past medical history of diabetes, previous SVT, hyperlipidemia, who comes in today with complaint of feelings of tachycardia. The feeling started about 30 mins prior arrival, she was getting her hair done when she began to feel like her heart was racing associated with chest tightness and mild SOB.  No lightheadedness. Her last episode similar to this was approximately year ago. She is on metoprolol home and hasn't missed doses.  No recent illnesses.  Not feeling lightheaded.  Past Medical History  Diagnosis Date  . Diabetes mellitus   . MVP (mitral valve prolapse)   . Suppurative hidradenitis 07/27/2013  . UTI (lower urinary tract infection) 07/27/2013  . Abdominal pain, unspecified site 08/24/2013  . Dyspnea 08/24/2013  . HLD (hyperlipidemia)   . Sinusitis    Past Surgical History  Procedure Laterality Date  . Hernia repair    . Cholecystectomy    . Cesarean section    . Cesarean section with bilateral tubal ligation    . Umbilical hernia repair     Family History  Problem Relation Age of Onset  . Diabetes Paternal Grandmother   . Heart disease Paternal Aunt   . Clotting disorder Mother   . Stomach cancer Maternal Aunt   . Colon cancer Maternal Aunt   . Prostate cancer Maternal Uncle   . Heart disease Paternal Grandfather   . Coronary artery disease Paternal Grandfather   . Lung cancer Maternal Uncle    History  Substance Use Topics  . Smoking status: Current Every Day Smoker -- 0.20 packs/day    Types: Cigarettes    Start date: 06/11/1985  . Smokeless tobacco: Never Used     Comment: currently smoking 3 cigs/day  . Alcohol Use: No    OB History    Gravida Para Term Preterm AB TAB SAB Ectopic Multiple Living   _0 0 0 0 3     Review of Systems  Constitutional: Negative for fever and chills.  HENT: Negative for nosebleeds.   Eyes: Negative for visual disturbance.  Respiratory: Positive for chest tightness. Negative for cough and shortness of breath.   Cardiovascular: Positive for palpitations.  Gastrointestinal: Negative for nausea, vomiting, abdominal pain, diarrhea and constipation.  Genitourinary: Negative for dysuria.  Skin: Negative for rash.  Neurological: Negative for weakness.  All other systems reviewed and are negative.     Allergies  Doxycycline hyclate  Home Medications   Prior to Admission medications   Medication Sig Start Date End Date Taking? Authorizing Provider  albuterol (PROVENTIL HFA;VENTOLIN HFA) 108 (90 BASE) MCG/ACT inhaler Inhale 2 puffs into the lungs every 6 (six) hours as needed for wheezing or shortness of breath. 10/07/13   Elsie Stain, MD  CVS NTS STEP 1 21 MG/24HR patch Place 21 mg onto the skin daily.  05/29/13   Historical Provider, MD  diltiazem (CARDIZEM CD) 180 MG 24 hr capsule Take 180 mg by mouth daily.  06/08/13   Historical Provider, MD  fenofibrate (TRICOR) 48 MG tablet Take 48 mg by mouth daily.    Historical Provider, MD  fluticasone (FLONASE) 50 MCG/ACT nasal spray Place 2 sprays into both nostrils  daily as needed. 09/07/13   Elsie Stain, MD  glimepiride (AMARYL) 4 MG tablet Take 8 mg by mouth daily with breakfast.     Historical Provider, MD  metFORMIN (GLUCOPHAGE) 1000 MG tablet Take 1 tablet (1,000 mg total) by mouth 2 (two) times daily with a meal. 12/09/13   Tresa Garter, MD  Na Sulfate-K Sulfate-Mg Sulf (SUPREP BOWEL PREP) SOLN Take 1 kit by mouth once. 09/23/13   Inda Castle, MD  polyethylene glycol [facility] / Floria Raveling) packet Take drink 2 glasses daily until large bowel movement in drink one glass daily 09/14/13   Leonard Schwartz, MD    BP 119/72 mmHg  Pulse 94  Temp(Src) 98.6 F (37 C) (Oral)  Resp 15  Ht _0  (1.753 m)  Wt 270 lb (122.471 kg)  BMI 39.85 kg/m2  SpO2 99% Physical Exam  Constitutional: She is oriented to person, place, and time. No distress.  HENT:  Head: Normocephalic and atraumatic.  Eyes: EOM are normal. Pupils are equal, round, and reactive to light.  Neck: Normal range of motion. Neck supple.  Cardiovascular: Intact distal pulses.   Pulmonary/Chest: No respiratory distress.  Abdominal: Soft. There is no tenderness.  Musculoskeletal: Normal range of motion.  Neurological: She is alert and oriented to person, place, and time.  Skin: No rash noted. She is not diaphoretic.  Psychiatric: She has a normal mood and affect.  Vitals reviewed.   ED Course  Procedures (including critical care time) Labs Review Labs Reviewed  I-STAT CHEM 8, ED - Abnormal; Notable for the following:    Glucose, Bld 340 (*)    Hemoglobin 17.3 (*)    HCT 51.0 (*)    All other components within normal limits  Randolm Idol, ED    Imaging Review Dg Chest Port 1 View  09/09/2014   CLINICAL DATA:  Chest pain and palpitations for 2 hr.  EXAM: PORTABLE CHEST - 1 VIEW  COMPARISON:  09/07/2013  FINDINGS: An apparent cavitary lesion over the left hilum is likely summation shadows, but additional imaging is recommended to confirm. Otherwise, there is no opacification of the low volume lungs. No edema, effusion, or pneumothorax. Normal heart size and mediastinal contours.  IMPRESSION: 1. Unusual appearance of the left hilum which could be summation of shadows or from a cavitary lesion. Recommend two view chest x-ray if CT not already planned. 2. Otherwise, negative chest.   Electronically Signed   By: Monte Fantasia M.D.   On: 09/09/2014 21:14     EKG Interpretation None      MDM   Final diagnoses:  None    Breanna Berger is a 54 year old female with past medical history of diabetes, previous SVT,  hyperlipidemia, who comes in today with complaint of feelings of tachycardia.   As above, tachycardic in the 180s with a good blood pressure, good mentation.  EKG w/ narrow complex, regular tachycardia concerning for SVT.  Modified Valsalva maneuver performed with conversion to sinus rhythm around 100. We'll give a small dose of metoprolol. I-STAT Chem-8 obtained shows K of 4.0, glucose 340 with a bicarbonate of 23.   CXR obtained per nursing protocol and WNL.  At this time, patient has been observed for a couple of hours, continues to feel well with normal heart rate at this time and has had no chest pain since she converted to sinus.  Doubt ACS.  Doubt infection.  Feel safe for d/c.  She is going to call her cardiologist tomorrow to  find out if she should modify any of her medications.  I have discussed the results, Dx and Tx plan with the patient. They expressed understanding and agree with the plan and were told to return to ED with any worsening of condition or concern.    Disposition: Discharge  Condition: Good  Discharge Medication List as of 09/09/2014 11:34 PM      Follow Up: Tresa Garter, MD [place] [id number] [phone]      Pt seen in conjunction with Dr. Caroline Sauger, MD 09/10/14 4715  Leonard Schwartz, MD 09/21/14 1606

## 2014-09-09 NOTE — ED Notes (Signed)
Pt reports mid chest tightness and palpitations approximately 30 minutes before arriving to the ED. Pt denies any associated symptoms with the chest tightness.

## 2014-09-15 ENCOUNTER — Emergency Department (HOSPITAL_COMMUNITY): Payer: 59

## 2014-09-15 ENCOUNTER — Encounter (HOSPITAL_COMMUNITY): Payer: Self-pay

## 2014-09-15 ENCOUNTER — Emergency Department (HOSPITAL_COMMUNITY)
Admission: EM | Admit: 2014-09-15 | Discharge: 2014-09-16 | Disposition: A | Discharge status: Home / Self Care | Payer: 59 | Attending: Emergency Medicine | Admitting: Emergency Medicine

## 2014-09-15 DIAGNOSIS — I471 Supraventricular tachycardia: Secondary | ICD-10-CM | POA: Diagnosis not present

## 2014-09-15 DIAGNOSIS — Z7982 Long term (current) use of aspirin: Secondary | ICD-10-CM | POA: Diagnosis not present

## 2014-09-15 DIAGNOSIS — Z8619 Personal history of other infectious and parasitic diseases: Secondary | ICD-10-CM | POA: Diagnosis not present

## 2014-09-15 DIAGNOSIS — E119 Type 2 diabetes mellitus without complications: Secondary | ICD-10-CM | POA: Insufficient documentation

## 2014-09-15 DIAGNOSIS — Z720 Tobacco use: Secondary | ICD-10-CM | POA: Insufficient documentation

## 2014-09-15 DIAGNOSIS — Z79899 Other long term (current) drug therapy: Secondary | ICD-10-CM | POA: Diagnosis not present

## 2014-09-15 DIAGNOSIS — Z8709 Personal history of other diseases of the respiratory system: Secondary | ICD-10-CM | POA: Insufficient documentation

## 2014-09-15 DIAGNOSIS — E785 Hyperlipidemia, unspecified: Secondary | ICD-10-CM | POA: Diagnosis not present

## 2014-09-15 DIAGNOSIS — Z87440 Personal history of urinary (tract) infections: Secondary | ICD-10-CM | POA: Diagnosis not present

## 2014-09-15 DIAGNOSIS — R52 Pain, unspecified: Secondary | ICD-10-CM

## 2014-09-15 DIAGNOSIS — R079 Chest pain, unspecified: Secondary | ICD-10-CM | POA: Diagnosis present

## 2014-09-15 LAB — COMPREHENSIVE METABOLIC PANEL
ALT: 31 U/L (ref 0–35)
ANION GAP: 7 (ref 5–15)
AST: 27 U/L (ref 0–37)
Albumin: 3.7 g/dL (ref 3.5–5.2)
Alkaline Phosphatase: 123 U/L — ABNORMAL HIGH (ref 39–117)
BUN: 8 mg/dL (ref 6–23)
CALCIUM: 9.3 mg/dL (ref 8.4–10.5)
CO2: 26 mmol/L (ref 19–32)
CREATININE: 0.69 mg/dL (ref 0.50–1.10)
Chloride: 102 mmol/L (ref 96–112)
GFR calc Af Amer: >90 mL/min (ref >90)
Glucose, Bld: 296 mg/dL — ABNORMAL HIGH (ref 70–99)
Potassium: 4 mmol/L (ref 3.5–5.1)
Sodium: 135 mmol/L (ref 135–145)
Total Bilirubin: 0.7 mg/dL (ref 0.3–1.2)
Total Protein: 7.8 g/dL (ref 6.0–8.3)

## 2014-09-15 LAB — I-STAT TROPONIN, ED: Troponin i, poc: 0 ng/mL (ref 0.00–0.08)

## 2014-09-15 MED ORDER — ADENOSINE 6 MG/2ML IV SOLN
INTRAVENOUS | Status: AC
  Filled 2014-09-15: qty 6

## 2014-09-15 MED ORDER — SODIUM CHLORIDE 0.9 % IV BOLUS (SEPSIS)
500.0000 mL | Freq: Once | INTRAVENOUS | Status: AC
  Administered 2014-09-15: 500 mL via INTRAVENOUS

## 2014-09-15 MED ORDER — METOPROLOL TARTRATE 1 MG/ML IV SOLN
5.0000 mg | Freq: Once | INTRAVENOUS | Status: AC
  Administered 2014-09-15: 5 mg via INTRAVENOUS
  Filled 2014-09-15: qty 5

## 2014-09-15 NOTE — ED Provider Notes (Signed)
CSN: [id number]     Arrival date & time 09/15/14  2058 History   First MD Initiated Contact with Patient 09/15/14 2114     Chief Complaint  Patient presents with  . Chest Pain     (Consider location/radiation/quality/duration/timing/severity/associated sxs/prior Treatment) Patient is a 54 y.o. female presenting with chest pain. The history is provided by the patient (pt complains of some chest pain and palpitations).  Chest Pain Pain location:  Substernal area Pain quality: aching   Pain radiates to:  Does not radiate Pain radiates to the back: no   Pain severity:  Mild Onset quality:  Sudden Timing:  Constant Progression:  Waxing and waning Chronicity:  Recurrent Associated symptoms: no abdominal pain, no back pain, no cough, no fatigue and no headache     Past Medical History  Diagnosis Date  . Diabetes mellitus   . MVP (mitral valve prolapse)   . Suppurative hidradenitis 07/27/2013  . UTI (lower urinary tract infection) 07/27/2013  . Abdominal pain, unspecified site 08/24/2013  . Dyspnea 08/24/2013  . HLD (hyperlipidemia)   . Sinusitis   . SVT (supraventricular tachycardia) 09/09/2014    converted with vagal    Past Surgical History  Procedure Laterality Date  . Hernia repair    . Cholecystectomy    . Cesarean section    . Cesarean section with bilateral tubal ligation    . Umbilical hernia repair     Family History  Problem Relation Age of Onset  . Diabetes Paternal Grandmother   . Heart disease Paternal Aunt   . Clotting disorder Mother   . Stomach cancer Maternal Aunt   . Colon cancer Maternal Aunt   . Prostate cancer Maternal Uncle   . Heart disease Paternal Grandfather   . Coronary artery disease Paternal Grandfather   . Lung cancer Maternal Uncle    History  Substance Use Topics  . Smoking status: Current Every Day Smoker -- 0.20 packs/day    Types: Cigarettes    Start date: 06/11/1985  . Smokeless tobacco: Never Used     Comment: currently smoking 3  cigs/day  . Alcohol Use: No   OB History    Gravida Para Term Preterm AB TAB SAB Ectopic Multiple Living   _0 0 0 0 3     Review of Systems  Constitutional: Negative for appetite change and fatigue.  HENT: Negative for congestion, ear discharge and sinus pressure.   Eyes: Negative for discharge.  Respiratory: Negative for cough.   Cardiovascular: Positive for chest pain.       Palpitations  Gastrointestinal: Negative for abdominal pain and diarrhea.  Genitourinary: Negative for frequency and hematuria.  Musculoskeletal: Negative for back pain.  Skin: Negative for rash.  Neurological: Negative for seizures and headaches.  Psychiatric/Behavioral: Negative for hallucinations.      Allergies  Doxycycline hyclate  Home Medications   Prior to Admission medications   Medication Sig Start Date End Date Taking? Authorizing Provider  albuterol (PROVENTIL HFA;VENTOLIN HFA) 108 (90 BASE) MCG/ACT inhaler Inhale 2 puffs into the lungs every 6 (six) hours as needed for wheezing or shortness of breath. 10/07/13  Yes Elsie Stain, MD  aspirin EC 81 MG tablet Take 81 mg by mouth daily.   Yes Historical Provider, MD  atorvastatin (LIPITOR) 40 MG tablet Take 40 mg by mouth daily.   Yes Historical Provider, MD  glimepiride (AMARYL) 4 MG tablet Take 8 mg by mouth daily with breakfast.  Yes Historical Provider, MD  metFORMIN (GLUCOPHAGE) 1000 MG tablet Take 1 tablet (1,000 mg total) by mouth 2 (two) times daily with a meal. 12/09/13  Yes Olugbemiga E Doreene Burke, MD  metoprolol succinate (TOPROL-XL) 50 MG 24 hr tablet Take 50 mg by mouth daily. Take with or immediately following a meal.   Yes Historical Provider, MD  polyethylene glycol (MIRALAX / GLYCOLAX) packet Take drink 2 glasses daily until large bowel movement in drink one glass daily 09/14/13  Yes Leonard Schwartz, MD  Na Sulfate-K Sulfate-Mg Sulf (SUPREP BOWEL PREP) SOLN Take 1 kit by mouth once. Patient not taking: Reported on 09/15/2014  09/23/13   Inda Castle, MD   BP 143/79 mmHg  Pulse 77  Temp(Src) 98.5 F (36.9 C) (Oral)  Resp 18  Ht _0  (1.727 m)  Wt 268 lb (121.564 kg)  BMI 40.76 kg/m2  SpO2 97% Physical Exam  Constitutional: She is oriented to person, place, and time. She appears well-developed.  HENT:  Head: Normocephalic.  Eyes: Conjunctivae and EOM are normal. No scleral icterus.  Neck: Neck supple. No thyromegaly present.  Cardiovascular: Exam reveals no gallop and no friction rub.   No murmur heard. Tachycardia,    Pulmonary/Chest: No stridor. She has no wheezes. She has no rales. She exhibits no tenderness.  Abdominal: She exhibits no distension. There is no tenderness. There is no rebound.  Musculoskeletal: Normal range of motion. She exhibits no edema.  Lymphadenopathy:    She has no cervical adenopathy.  Neurological: She is oriented to person, place, and time. She exhibits normal muscle tone. Coordination normal.  Skin: No rash noted. No erythema.  Psychiatric: She has a normal mood and affect. Her behavior is normal.    ED Course  Procedures (including critical care time) Labs Review Labs Reviewed  CBC WITH DIFFERENTIAL/PLATELET - Abnormal; Notable for the following:    WBC 13.5 (*)    RBC 6.00 (*)    MCV 72.5 (*)    MCH 24.3 (*)    All other components within normal limits  COMPREHENSIVE METABOLIC PANEL - Abnormal; Notable for the following:    Glucose, Bld 296 (*)    Alkaline Phosphatase 123 (*)    All other components within normal limits  Randolm Idol, ED    Imaging Review Dg Chest Port 1 View  09/15/2014   CLINICAL DATA:  Mid chest pain starting 1 hour ago  EXAM: PORTABLE CHEST - 1 VIEW  COMPARISON:  09/09/2014  FINDINGS: Cardiomediastinal silhouette is stable. No acute infiltrate or pleural effusion. No pulmonary edema. Bony thorax is unremarkable.  IMPRESSION: No active disease.   Electronically Signed   By: Lahoma Crocker M.D.   On: 09/15/2014 22:15     EKG  Interpretation   Date/Time:  Wednesday September 15 2014 21:36:51 EDT Ventricular Rate:  101 PR Interval:  181 QRS Duration: 66 QT Interval:  338 QTC Calculation: 438 R Axis:   59 Text Interpretation:  Age not entered, assumed to be  54 years old for  purpose of ECG interpretation Sinus tachycardia Low voltage, precordial  leads Confirmed by Taris Galindo  MD, Stanisha Lorenz [phone]) on 09/15/2014 11:40:51 PM      MDM   Final diagnoses:  Pain  SVT (supraventricular tachycardia)    Pt was in svt and when she was using the bathroom she converted.   She has had 2 episodes in one week. She will follow up with her cardiologist    Milton Ferguson, MD 09/15/14 [phone]

## 2014-09-15 NOTE — Discharge Instructions (Signed)
Follow up with your cardiologist next week.  Return sooner if problems

## 2014-09-15 NOTE — ED Notes (Signed)
Pt states that she started having some shortness of breath about 45 minutes ago with some centralized chest discomfort

## 2014-09-15 NOTE — ED Notes (Signed)
Pt ambulated to restroom. 

## 2014-09-16 ENCOUNTER — Other Ambulatory Visit (HOSPITAL_COMMUNITY): Payer: Self-pay | Admitting: Internal Medicine

## 2014-09-16 DIAGNOSIS — Z1231 Encounter for screening mammogram for malignant neoplasm of breast: Secondary | ICD-10-CM

## 2014-09-16 LAB — CBC WITH DIFFERENTIAL/PLATELET
Basophils Absolute: 0 10*3/uL (ref 0.0–0.1)
Basophils Relative: 0 % (ref 0–1)
EOS PCT: 2 % (ref 0–5)
Eosinophils Absolute: 0.3 10*3/uL (ref 0.0–0.7)
HCT: 43.5 % (ref 36.0–46.0)
Hemoglobin: 14.6 g/dL (ref 12.0–15.0)
Lymphocytes Relative: 32 % (ref 12–46)
Lymphs Abs: 4.3 10*3/uL — ABNORMAL HIGH (ref 0.7–4.0)
MCH: 24.3 pg — AB (ref 26.0–34.0)
MCHC: 33.6 g/dL (ref 30.0–36.0)
MCV: 72.5 fL — AB (ref 78.0–100.0)
MONO ABS: 0.7 10*3/uL (ref 0.1–1.0)
Monocytes Relative: 5 % (ref 3–12)
Neutro Abs: 8.2 10*3/uL — ABNORMAL HIGH (ref 1.7–7.7)
Neutrophils Relative %: 61 % (ref 43–77)
Platelets: 220 10*3/uL (ref 150–400)
RBC: 6 MIL/uL — AB (ref 3.87–5.11)
RDW: 14.5 % (ref 11.5–15.5)
WBC: 13.5 10*3/uL — ABNORMAL HIGH (ref 4.0–10.5)

## 2014-09-17 ENCOUNTER — Ambulatory Visit (HOSPITAL_COMMUNITY): Payer: No Typology Code available for payment source

## 2014-09-17 ENCOUNTER — Telehealth: Payer: Self-pay | Admitting: *Deleted

## 2014-09-17 ENCOUNTER — Ambulatory Visit (HOSPITAL_COMMUNITY)
Admission: RE | Admit: 2014-09-17 | Discharge: 2014-09-17 | Disposition: A | Discharge status: Home / Self Care | Payer: 59 | Source: Ambulatory Visit | Attending: Internal Medicine | Admitting: Internal Medicine

## 2014-09-17 DIAGNOSIS — Z1231 Encounter for screening mammogram for malignant neoplasm of breast: Secondary | ICD-10-CM | POA: Diagnosis not present

## 2014-09-17 NOTE — Telephone Encounter (Signed)
-----   Message from Tresa Garter, MD sent at 09/17/2014  1:16 PM EDT ----- Please inform patient that her screening mammogram shows normal evidence of malignancy.

## 2014-09-17 NOTE — Telephone Encounter (Signed)
No answer when tried to call patient regarding her mammogram which was normal.

## 2014-09-17 NOTE — Telephone Encounter (Signed)
Spoke to patient about her lab results.  Patient was appreciative and had no questions

## 2015-01-31 ENCOUNTER — Other Ambulatory Visit: Payer: Self-pay | Admitting: Internal Medicine

## 2015-01-31 DIAGNOSIS — D259 Leiomyoma of uterus, unspecified: Secondary | ICD-10-CM

## 2015-02-01 ENCOUNTER — Ambulatory Visit
Admission: RE | Admit: 2015-02-01 | Discharge: 2015-02-01 | Disposition: A | Discharge status: Home / Self Care | Payer: 59 | Source: Ambulatory Visit | Attending: Internal Medicine | Admitting: Internal Medicine

## 2015-02-01 DIAGNOSIS — D259 Leiomyoma of uterus, unspecified: Secondary | ICD-10-CM

## 2015-02-02 ENCOUNTER — Other Ambulatory Visit: Payer: 59

## 2015-02-09 ENCOUNTER — Ambulatory Visit (HOSPITAL_BASED_OUTPATIENT_CLINIC_OR_DEPARTMENT_OTHER): Payer: 59

## 2015-04-25 ENCOUNTER — Emergency Department (HOSPITAL_COMMUNITY)
Admission: EM | Admit: 2015-04-25 | Discharge: 2015-04-25 | Disposition: A | Discharge status: Home / Self Care | Payer: 59 | Attending: Emergency Medicine | Admitting: Emergency Medicine

## 2015-04-25 ENCOUNTER — Encounter (HOSPITAL_COMMUNITY): Payer: Self-pay | Admitting: Emergency Medicine

## 2015-04-25 ENCOUNTER — Emergency Department (HOSPITAL_COMMUNITY): Payer: 59

## 2015-04-25 DIAGNOSIS — I471 Supraventricular tachycardia: Secondary | ICD-10-CM

## 2015-04-25 DIAGNOSIS — F17210 Nicotine dependence, cigarettes, uncomplicated: Secondary | ICD-10-CM | POA: Diagnosis not present

## 2015-04-25 DIAGNOSIS — Z87440 Personal history of urinary (tract) infections: Secondary | ICD-10-CM | POA: Diagnosis not present

## 2015-04-25 DIAGNOSIS — E785 Hyperlipidemia, unspecified: Secondary | ICD-10-CM | POA: Diagnosis not present

## 2015-04-25 DIAGNOSIS — E669 Obesity, unspecified: Secondary | ICD-10-CM | POA: Insufficient documentation

## 2015-04-25 DIAGNOSIS — E119 Type 2 diabetes mellitus without complications: Secondary | ICD-10-CM | POA: Insufficient documentation

## 2015-04-25 DIAGNOSIS — I341 Nonrheumatic mitral (valve) prolapse: Secondary | ICD-10-CM | POA: Insufficient documentation

## 2015-04-25 DIAGNOSIS — R0602 Shortness of breath: Secondary | ICD-10-CM | POA: Diagnosis not present

## 2015-04-25 DIAGNOSIS — Z7982 Long term (current) use of aspirin: Secondary | ICD-10-CM | POA: Insufficient documentation

## 2015-04-25 DIAGNOSIS — Z872 Personal history of diseases of the skin and subcutaneous tissue: Secondary | ICD-10-CM | POA: Diagnosis not present

## 2015-04-25 DIAGNOSIS — R110 Nausea: Secondary | ICD-10-CM | POA: Insufficient documentation

## 2015-04-25 DIAGNOSIS — Z79899 Other long term (current) drug therapy: Secondary | ICD-10-CM | POA: Diagnosis not present

## 2015-04-25 DIAGNOSIS — R002 Palpitations: Secondary | ICD-10-CM | POA: Diagnosis present

## 2015-04-25 LAB — BASIC METABOLIC PANEL
ANION GAP: 11 (ref 5–15)
BUN: 13 mg/dL (ref 6–20)
CALCIUM: 9.8 mg/dL (ref 8.9–10.3)
CHLORIDE: 103 mmol/L (ref 101–111)
CO2: 25 mmol/L (ref 22–32)
Creatinine, Ser: 0.71 mg/dL (ref 0.44–1.00)
GFR calc Af Amer: >60 mL/min (ref >60)
GFR calc non Af Amer: >60 mL/min (ref >60)
Glucose, Bld: 169 mg/dL — ABNORMAL HIGH (ref 65–99)
Potassium: 4.2 mmol/L (ref 3.5–5.1)
Sodium: 139 mmol/L (ref 135–145)

## 2015-04-25 LAB — CBC WITH DIFFERENTIAL/PLATELET
BASOS ABS: 0 10*3/uL (ref 0.0–0.1)
Basophils Relative: 0 %
EOS ABS: 0.2 10*3/uL (ref 0.0–0.7)
Eosinophils Relative: 1 %
HEMATOCRIT: 40.8 % (ref 36.0–46.0)
Hemoglobin: 13.1 g/dL (ref 12.0–15.0)
Lymphocytes Relative: 31 %
Lymphs Abs: 4.2 10*3/uL — ABNORMAL HIGH (ref 0.7–4.0)
MCH: 24 pg — ABNORMAL LOW (ref 26.0–34.0)
MCHC: 32.1 g/dL (ref 30.0–36.0)
MCV: 74.7 fL — ABNORMAL LOW (ref 78.0–100.0)
Monocytes Absolute: 0.5 10*3/uL (ref 0.1–1.0)
Monocytes Relative: 4 %
NEUTROS ABS: 8.8 10*3/uL — AB (ref 1.7–7.7)
Neutrophils Relative %: 64 %
Platelets: 285 10*3/uL (ref 150–400)
RBC: 5.46 MIL/uL — ABNORMAL HIGH (ref 3.87–5.11)
RDW: 15.4 % (ref 11.5–15.5)
WBC: 13.7 10*3/uL — AB (ref 4.0–10.5)

## 2015-04-25 LAB — TROPONIN I: Troponin I: <0.03 ng/mL (ref <0.031)

## 2015-04-25 MED ORDER — ADENOSINE 6 MG/2ML IV SOLN
INTRAVENOUS | Status: AC
  Filled 2015-04-25: qty 6

## 2015-04-25 NOTE — Discharge Instructions (Signed)
Paroxysmal Supraventricular Tachycardia Paroxysmal supraventricular tachycardia (PSVT) is a type of abnormal heart rhythm. It causes your heart to beat very quickly and then suddenly stop beating so quickly. A normal heart rate is 60-100 beats per minute. During an episode of PSVT, your heart rate may be 150-250 beats per minute. This can make you feel light-headed and short of breath. An episode of PSVT can be frightening. It is usually not dangerous. The heart has four chambers. All chambers need to work together for the heart to beat effectively. A normal heartbeat usually starts in the right upper chamber of the heart (atrium) when an area (sinoatrial node) puts out an electrical signal that spreads to the other chambers. People with PSVT may have abnormal electrical pathways, or they may have other areas in the upper chambers that send out electrical signals. The result is a very rapid heartbeat. When your heart beats very quickly, it does not have time to fill completely with blood. When PSVT happens often or it lasts for long periods, it can lead to heart weakness and failure. Most people with PSVT do not have any other heart disease. CAUSES Abnormal electrical activity in the heart causes PSVT. It is not known why some people get PSVT and others do not. RISK FACTORS You may be more likely to have PSVT if:  You are 20-30 years old.  You are a woman. Other factors that may increase your chances of an attack include:  Stress.  Being tired.  Smoking.  Stimulant drugs.  Alcoholic drinks.  Caffeine.  Pregnancy. SIGNS AND SYMPTOMS A mild episode of PSVT may cause no symptoms. If you do have signs and symptoms, they may include:  A pounding heart.  Feeling of skipped heartbeats (palpitations).  Weakness.  Shortness of breath.  Tightness or pain in your chest.  Light-headedness.  Anxiety.  Dizziness.  Sweating.  Nausea.  A fainting spell. DIAGNOSIS Your health care  provider may suspect PSVT if you have symptoms that come and go. The health care provider will do a physical exam. If you are having an episode during the exam, the health care provider may be able to diagnose PSVT by listening to your heart and feeling your pulse. Tests may also be done, including:  An electrical study of your heart (electrocardiogram, or ECG).  A test in which you wear a portable ECG monitor all day (Holter monitor) or for several days (event monitor).  A test that involves taking an image of your heart using sound waves (echocardiogram) to rule out other causes of a fast heart rate. TREATMENT You may not need treatment if episodes of PSVT do not happen often or if they do not cause symptoms. If PSVT episodes do cause symptoms, your health care provider may first suggest trying a self-treatment called vagus nerve stimulation. The vagus nerve extends down from the brain. It regulates certain body functions. Stimulating this nerve can slow down the heart. Your health care provider can teach you ways to do this. You may need to try a few ways to find what works best for you. Options include:  Holding your breath and pushing, as though you are having a bowel movement.  Massaging an area on one side of your neck below your jaw.  Bending forward with your head between your legs.  Bending forward with your head between your legs and coughing.  Massaging your eyeballs with your eyes closed. If vagus nerve stimulation does not work, other treatment options include:    Medicines to prevent an attack.  Being treated in the hospital with medicine or electric shock to stop an attack (cardioversion). This treatment can include:  Getting medicine through an IV line.  Having a small electric shock delivered to your heart. You will be given medicine to make you sleep through this procedure.  If you have frequent episodes with symptoms, you may need a procedure to get rid of the faulty  areas of your heart (radiofrequency ablation) and end the episodes of PSVT. In this procedure:  A long, thin tube (catheter) is passed through one of your veins into your heart.  Energy directed through the catheter eliminates the areas of your heart that are causing abnormal electric stimulation. HOME CARE INSTRUCTIONS  Take medicines only as directed by your health care provider.  Do not use caffeine in any form if caffeine triggers episodes of PSVT. Otherwise, consume caffeine in moderation. This means no more than a few cups of coffee or the equivalent each day.  Do not drink alcohol if alcohol triggers episodes of PSVT. Otherwise, limit alcohol intake to no more than 1 drink per day for nonpregnant women and 2 drinks per day for men. One drink equals 12 ounces of beer, 5 ounces of wine, or 1 ounces of hard liquor.  Do not use any tobacco products, including cigarettes, chewing tobacco, or electronic cigarettes. If you need help quitting, ask your health care provider.  Try to get at least 7 hours of sleep each night.  Find healthy ways to manage stress.  Perform vagus nerve stimulation as directed by your health care provider.  Maintain a healthy weight.  Get some exercise on most days. Ask your health care provider to suggest some good activities for you. SEEK MEDICAL CARE IF:  You are having episodes of PSVT more often, or they are lasting longer.  Vagus nerve stimulation is no longer helping.  You have new symptoms during an episode. SEEK IMMEDIATE MEDICAL CARE IF:  You have chest pain or trouble breathing.  You have an episode of PSVT that has lasted longer than 20 minutes.  You have passed out from an episode of PSVT. These symptoms may represent a serious problem that is an emergency. Do not wait to see if the symptoms will go away. Get medical help right away. Call your local emergency services (911 in the [place] Do not drive yourself to the hospital.   This  information is not intended to replace advice given to you by your health care provider. Make sure you discuss any questions you have with your health care provider.   Document Released: 05/28/2005 Document Revised: 06/18/2014 Document Reviewed: 11/05/2013 Elsevier Interactive Patient Education 2016 [employer]  

## 2015-04-25 NOTE — ED Notes (Signed)
Vasovagal maneuver effective, pt's HR 104

## 2015-04-25 NOTE — ED Notes (Signed)
Pt arrives from home stating that she has a "rapid HR", states she has been seen prior for similar problem. Pt reports discomfort in her chest, nausea and mild SHOB. RATE 171

## 2015-04-25 NOTE — ED Notes (Signed)
Patient left at this time with all belongings. Refused wheelchair 

## 2015-04-25 NOTE — ED Provider Notes (Signed)
CSN: [id number]     Arrival date & time 04/25/15  [id number] History   First MD Initiated Contact with Patient 04/25/15 0405     Chief Complaint  Patient presents with  . Palpitations     (Consider location/radiation/quality/duration/timing/severity/associated sxs/prior Treatment) HPI  This 54 year old female with history of SVT who presents with palpitations and chest pain. Patient reports onset of symptoms at approximately 11 PM. She reports mild nausea and shortness of breath. She's had 2 episodes of SVT in the past. She has never received adenosine. She is on metoprolol daily. She did take an additional dose of metoprolol without any improvement. Noted to be tachycardic in the 170s upon arrival. No known history of coronary artery disease. She does have diabetes and hyperlipidemia. She is followed by cardiology.  Past Medical History  Diagnosis Date  . Diabetes mellitus   . MVP (mitral valve prolapse)   . Suppurative hidradenitis 07/27/2013  . UTI (lower urinary tract infection) 07/27/2013  . Abdominal pain, unspecified site 08/24/2013  . Dyspnea 08/24/2013  . HLD (hyperlipidemia)   . Sinusitis   . SVT (supraventricular tachycardia) ([place]) 09/09/2014    converted with vagal    Past Surgical History  Procedure Laterality Date  . Hernia repair    . Cholecystectomy    . Cesarean section    . Cesarean section with bilateral tubal ligation    . Umbilical hernia repair     Family History  Problem Relation Age of Onset  . Diabetes Paternal Grandmother   . Heart disease Paternal Aunt   . Clotting disorder Mother   . Stomach cancer Maternal Aunt   . Colon cancer Maternal Aunt   . Prostate cancer Maternal Uncle   . Heart disease Paternal Grandfather   . Coronary artery disease Paternal Grandfather   . Lung cancer Maternal Uncle    Social History  Substance Use Topics  . Smoking status: Current Every Day Smoker -- 0.20 packs/day    Types: Cigarettes    Start date: 06/11/1985  .  Smokeless tobacco: Never Used     Comment: currently smoking 3 cigs/day  . Alcohol Use: No   OB History    Gravida Para Term Preterm AB TAB SAB Ectopic Multiple Living   4 3 3  1 1  0 0 0 3     Review of Systems  Constitutional: Negative for fever.  Respiratory: Positive for chest tightness and shortness of breath. Negative for cough.   Cardiovascular: Positive for palpitations. Negative for chest pain and leg swelling.  Gastrointestinal: Negative for nausea, vomiting and abdominal pain.  Neurological: Negative for headaches.  All other systems reviewed and are negative.     Allergies  Doxycycline hyclate  Home Medications   Prior to Admission medications   Medication Sig Start Date End Date Taking? Authorizing Provider  albuterol (PROVENTIL HFA;VENTOLIN HFA) 108 (90 BASE) MCG/ACT inhaler Inhale 2 puffs into the lungs every 6 (six) hours as needed for wheezing or shortness of breath. 10/07/13  Yes Elsie Stain, MD  aspirin EC 81 MG tablet Take 81 mg by mouth 3 (three) times a week.    Yes Historical Provider, MD  atorvastatin (LIPITOR) 40 MG tablet Take 40 mg by mouth daily.   Yes Historical Provider, MD  glimepiride (AMARYL) 4 MG tablet Take 8 mg by mouth daily with breakfast.    Yes Historical Provider, MD  Liraglutide (VICTOZA) 18 MG/3ML SOPN Inject 3 mLs into the skin daily.   Yes Historical Provider, MD  metFORMIN (GLUCOPHAGE) 1000 MG tablet Take 1 tablet (1,000 mg total) by mouth 2 (two) times daily with a meal. 12/09/13  Yes Olugbemiga E Doreene Burke, MD  metoprolol succinate (TOPROL-XL) 50 MG 24 hr tablet Take 50 mg by mouth daily. Take with or immediately following a meal.   Yes Historical Provider, MD  polyethylene glycol (MIRALAX / GLYCOLAX) packet Take drink 2 glasses daily until large bowel movement in drink one glass daily Patient taking differently: Take 17 g by mouth daily as needed for mild constipation.  09/14/13  Yes Leonard Schwartz, MD   BP 99/67 mmHg  Pulse 92   Temp(Src) 98.8 F (37.1 C)  Resp 20  Ht 5\' 8"  (1.727 m)  Wt 265 lb (120.203 kg)  BMI 40.30 kg/m2  SpO2 99% Physical Exam  Constitutional: She is oriented to person, place, and time. She appears well-developed and well-nourished.  Obese  HENT:  Head: Normocephalic and atraumatic.  Cardiovascular: Regular rhythm, normal heart sounds and intact distal pulses.   Tachycardia  Pulmonary/Chest: Effort normal and breath sounds normal. No respiratory distress. She has no wheezes.  Abdominal: Soft. There is no tenderness.  Musculoskeletal: She exhibits no edema.  Neurological: She is alert and oriented to person, place, and time.  Skin: Skin is warm and dry.  Psychiatric: She has a normal mood and affect.  Nursing note and vitals reviewed.   ED Course  Procedures (including critical care time) Labs Review Labs Reviewed  CBC WITH DIFFERENTIAL/PLATELET - Abnormal; Notable for the following:    WBC 13.7 (*)    RBC 5.46 (*)    MCV 74.7 (*)    MCH 24.0 (*)    Neutro Abs 8.8 (*)    Lymphs Abs 4.2 (*)    All other components within normal limits  BASIC METABOLIC PANEL - Abnormal; Notable for the following:    Glucose, Bld 169 (*)    All other components within normal limits  TROPONIN I    Imaging Review Dg Chest 2 View  04/25/2015  CLINICAL DATA:  Palpitations. Chest discomfort tonight, onset while sleeping. EXAM: CHEST  2 VIEW COMPARISON:  09/15/2014 FINDINGS: The cardiomediastinal contours are normal. The lungs are clear. Pulmonary vasculature is normal. No consolidation, pleural effusion, or pneumothorax. No acute osseous abnormalities are seen. IMPRESSION: No acute pulmonary process. Electronically Signed   By: Jeb Levering M.D.   On: 04/25/2015 05:08   I have personally reviewed and evaluated these images and lab results as part of my medical decision-making.   EKG Interpretation   Date/Time:  Monday April 25 2015 04:02:55 EST Ventricular Rate:  167 PR Interval:   116 QRS Duration: 68 QT Interval:  271 QTC Calculation: 452 R Axis:   73 Text Interpretation:  SVT  Low voltage, precordial leads  Confirmed by Lashell Moffitt  MD, Lyndol Vanderheiden ([id number]) on 04/25/2015 5:25:29 AM    EKG #2   EKG Interpretation  Date/Time:  Monday April 25 2015 04:13:26 EST Ventricular Rate:  109 PR Interval:  157 QRS Duration: 72 QT Interval:  326 QTC Calculation: 439 R Axis:   72 Text Interpretation:  Sinus tachycardia Low voltage, precordial leads Confirmed by Basilio Meadow  MD, Benjamen Koelling ([id number]) on 04/25/2015 5:50:29 AM        MDM   Final diagnoses:  SVT (supraventricular tachycardia) ([place])   Patient presents with SVT and chest pain. Nontoxic on exam.  History of the same. She has responded to vagal maneuvers in the past. She Valsalva with immediate elevation of her  lower extremities. She reported improvement of symptoms and heart rate is now sinus tachycardia at 104. Repeat EKG is reassuring. No ischemic changes. She is currently asymptomatic. Symptoms were likely related to rate related. Workup including troponin is reassuring. She has been monitored without recurrence of symptoms or arrhythmia.  Will discharge with close cardiology follow-up.  After history, exam, and medical workup I feel the patient has been appropriately medically screened and is safe for discharge home. Pertinent diagnoses were discussed with the patient. Patient was given return precautions.   Merryl Hacker, MD 04/25/15 [phone]

## 2015-04-25 NOTE — ED Notes (Signed)
Pt requested something to eat and drink. Ok per [employer]. Pt given a [place] sandwich, apple sauce and sprite to drink.

## 2015-04-28 ENCOUNTER — Ambulatory Visit (HOSPITAL_BASED_OUTPATIENT_CLINIC_OR_DEPARTMENT_OTHER): Payer: 59 | Attending: Internal Medicine | Admitting: Radiology

## 2015-04-28 VITALS — Ht 69.0 in | Wt 272.0 lb

## 2015-04-28 DIAGNOSIS — G4733 Obstructive sleep apnea (adult) (pediatric): Secondary | ICD-10-CM | POA: Insufficient documentation

## 2015-04-28 DIAGNOSIS — G4730 Sleep apnea, unspecified: Secondary | ICD-10-CM | POA: Diagnosis present

## 2015-05-01 DIAGNOSIS — G4733 Obstructive sleep apnea (adult) (pediatric): Secondary | ICD-10-CM | POA: Diagnosis not present

## 2015-05-01 NOTE — Progress Notes (Signed)
  Patient Name: Breanna Berger, Breanna Berger Date: 04/28/2015 Gender: Female D.O.B: 1961-01-14 Age (years): 54 Referring Provider: Angelica Chessman Height (inches): 69 Interpreting Physician: Baird Lyons MD, ABSM Weight (lbs): 272 RPSGT: Laren Everts BMI: 40 MRN: [id number] Neck Size: 17.00 CLINICAL INFORMATION The patient is referred for a CPAP titration to treat sleep apnea.   Date of NPSG, Split Night or HST: Diagnostic unattended Home Sleep Test 10/07/13- AHI 16/ hr with desaturation to 82%,   body weight 275 lbs  SLEEP STUDY TECHNIQUE As per the AASM Manual for the Scoring of Sleep and Associated Events v2.3 (April 2016) with a hypopnea requiring 4% desaturations. The channels recorded and monitored were frontal, central and occipital EEG, electrooculogram (EOG), submentalis EMG (chin), nasal and oral airflow, thoracic and abdominal wall motion, anterior tibialis EMG, snore microphone, electrocardiogram, and pulse oximetry. Continuous positive airway pressure (CPAP) was initiated at the beginning of the study and titrated to treat sleep-disordered breathing. MEDICATIONS Medications taken by the patient : charted for review Medications administered by patient during sleep study : No sleep medicine administered.  TECHNICIAN COMMENTS Comments added by technician: NONE  Comments added by scorer: N/A  RESPIRATORY PARAMETERS Optimal PAP Pressure (cm): 11 AHI at Optimal Pressure (/hr): 0.0 Overall Minimal O2 (%): 92.00 Supine % at Optimal Pressure (%): 0 Minimal O2 at Optimal Pressure (%): 93.0    SLEEP ARCHITECTURE The study was initiated at 11:17:07 PM and ended at 5:22:53 AM. Sleep onset time was 16.9 minutes and the sleep efficiency was 79.8%. The total sleep time was 292.0 minutes. The patient spent 16.27% of the night in stage N1 sleep, 64.73% in stage N2 sleep, 0.00% in stage N3 and 19.01% in REM.Stage REM latency was 95.5 minutes Wake after sleep onset was 56.9. Alpha  intrusion was absent. Supine sleep was 11.99%.  CARDIAC DATA The 2 lead EKG demonstrated sinus rhythm. The mean heart rate was 74.50 beats per minute. Other EKG findings include: None.  LEG MOVEMENT DATA The total Periodic Limb Movements of Sleep (PLMS) were 52. The PLMS index was 10.68. A PLMS index of <15 is considered normal in adults.  IMPRESSIONS - The optimal PAP pressure was 11 cm of water. - Central sleep apnea was not noted during this titration (CAI = 0.0/h). - Significant oxygen desaturations were not observed during this titration (min O2 = 92.00%). - No snoring was audible during this study. - No cardiac abnormalities were observed during this study. - Mild periodic limb movements were observed during this study. Arousals associated with PLMs were rare.  DIAGNOSIS - Obstructive Sleep Apnea (327.23 [G47.33 ICD-10])  RECOMMENDATIONS - Trial of CPAP therapy on 11 cm H2O with a Medium size Fisher&Paykel Nasal Mask Eson mask and heated humidification. - Avoid alcohol, sedatives and other CNS depressants that may worsen sleep apnea and disrupt normal sleep architecture. - Sleep hygiene should be reviewed to assess factors that may improve sleep quality. - Weight management and regular exercise should be initiated or continued.  Deneise Lever Diplomate, American Board of Sleep Medicine  ELECTRONICALLY SIGNED ON:  05/01/2015, 9:50 AM [place] PH: [phone]   FX: [phone] [place]

## 2015-06-28 ENCOUNTER — Encounter: Payer: Self-pay | Admitting: Internal Medicine

## 2015-06-28 ENCOUNTER — Ambulatory Visit (INDEPENDENT_AMBULATORY_CARE_PROVIDER_SITE_OTHER): Payer: BLUE CROSS/BLUE SHIELD | Admitting: Internal Medicine

## 2015-06-28 VITALS — BP 114/76 | HR 82 | Ht 69.0 in | Wt 271.0 lb

## 2015-06-28 DIAGNOSIS — J449 Chronic obstructive pulmonary disease, unspecified: Secondary | ICD-10-CM

## 2015-06-28 DIAGNOSIS — G4733 Obstructive sleep apnea (adult) (pediatric): Secondary | ICD-10-CM | POA: Diagnosis not present

## 2015-06-28 NOTE — Patient Instructions (Addendum)
Weight control is simply a matter of calorie balance which needs to be tilted in your favor by eating less and exercising more.  To get the most out of exercise, you need to be continuously aware that you are short of breath, but never out of breath, for 30 minutes daily. As you improve, it will actually be easier for you to do the same amount of exercise  in  30 minutes so always push to the level where you are short of breath.  If this does not result in gradual weight reduction then I strongly recommend you see a nutritionist with a food diary x 2 weeks so that we can work out a negative calorie balance which is universally effective in steady weight loss programs.  Think of your calorie balance like you do your bank account where in this case you want the balance to go down so you must take in less calories than you burn up.  It's just that simple:  Hard to do, but easy to understand.  Good luck!   Dr Marice Potter does offer oral splints and you can call her office  Re options

## 2015-06-28 NOTE — Progress Notes (Signed)
   Subjective:    Patient ID: Breanna Berger, female    DOB: 12/28/1960,     MRN: [id number]  HPI  67 yobf quit smoking 05/2015 with mild variable doe which improved p quit  and referred by Vista Lawman with abn pfts and PSG   06/28/2015 1st [facility] office visit/ Dave Mergen   Chief Complaint  Patient presents with  . Sleep Consult    Referred by Dr. Vista Lawman for eval of OSA. PSG done in Nov 2016. She has not been prescribed CPAP.    Sleep Questionnaire:  What time do you typically go to bed?   11pm   How long does it take you to fall asleep?  48m  How many times during the night do you wake up?   0 What time do you get out of bed to start your day?  8a Do you drive or operate heavy machinery in your occupation?  no  How much has your weight changed (up or down) over the past two years?   20 lb  Have you ever had a sleep study before?   yes    If yes, location of study?   [facility]   If yes, date of study?   111/7/16   Do you currently use CPAP?   no If so, what pressure?   n/a   Do you wear oxygen at any time?  no Epworth score   2      Review of Systems  Constitutional: Negative for fever, chills and unexpected weight change.  HENT: Positive for congestion. Negative for dental problem, ear pain, nosebleeds, postnasal drip, rhinorrhea, sinus pressure, sneezing, sore throat, trouble swallowing and voice change.   Eyes: Negative for visual disturbance.  Respiratory: Positive for shortness of breath. Negative for cough and choking.   Cardiovascular: Negative for chest pain and leg swelling.  Gastrointestinal: Negative for vomiting, abdominal pain and diarrhea.  Genitourinary: Negative for difficulty urinating.       Indigestion  Musculoskeletal: Negative for arthralgias.  Skin: Negative for rash.  Neurological: Negative for tremors, syncope and headaches.  Hematological: Does not bruise/bleed easily.       Objective:   Physical Exam  amb obese bf   Wt Readings from Last  3 Encounters:  06/28/15 271 lb (122.925 kg)  04/28/15 272 lb (123.378 kg)  04/25/15 265 lb (120.203 kg)    Vital signs reviewed   HEENT: nl dentition, turbinates, and oropharynx. Nl external ear canals without cough reflex Modified Mallampati Score = 1/2     NECK :  without JVD/Nodes/TM/ nl carotid upstrokes bilaterally   LUNGS: no acc muscle use,  Nl contour chest which is clear to A and P bilaterally without cough on insp or exp maneuvers   CV:  RRR  no s3 or murmur or increase in P2, no edema   ABD:  soft and nontender with nl inspiratory excursion in the supine position. No bruits or organomegaly, bowel sounds nl  MS:  Nl gait/ ext warm without deformities, calf tenderness, cyanosis or clubbing No obvious joint restrictions   SKIN: warm and dry without lesions    NEURO:  alert, approp, nl sensorium with  no motor deficits          Assessment & Plan:

## 2015-06-29 NOTE — Assessment & Plan Note (Addendum)
Body mass index is 40   Lab Results  Component Value Date   TSH 0.913 07/27/2013     Contributing to gerd tendency/ doe/reviewed the need and the process to achieve and maintain neg calorie balance > defer f/u primary care including intermittently monitoring thyroid status

## 2015-06-29 NOTE — Assessment & Plan Note (Signed)
PFTs 09/16/13:   FeV1 83% FVC 71%  Fev1/FVC 94%  TLC 67%  DLCO 51%    I reviewed the Fletcher curve with the patient that basically indicates  if you quit smoking when your best day FEV1 is still well preserved (as is clearly  the case here)  it is highly unlikely you will progress to severe disease and informed the patient there was no medication on the market that has proven to alter the curve/ its downward trajectory  or the likelihood of progression of their disease.  Therefore stopping smoking and maintaining abstinence is the most important aspect of care, not choice of inhalers or for that matter, doctors.    No need for pulmonary meds or f/u

## 2015-06-29 NOTE — Assessment & Plan Note (Signed)
Diagnostic unattended Home Sleep Test 10/07/13- AHI 16/ hr with desaturation to 82%, body weight 275 lbs CPAP titration 04/28/15 - The optimal PAP pressure was 11 cm of water. - Central sleep apnea was not noted during this titration (CAI = 0.0/h). - Significant oxygen desaturations were not observed during this titration (min O2 = 92.00%). - No snoring was audible during this study. - No cardiac abnormalities were observed during this study. - Mild periodic limb movements were observed during this study. Arousals associated with PLMs were rare.  DIAGNOSIS - Obstructive Sleep Apnea (327.23 [G47.33 ICD-10])  RECOMMENDATIONS - Trial of CPAP therapy on 11 cm H2O with a Medium size Fisher&Paykel Nasal Mask Eson mask and heated humidification. - Avoid alcohol, sedatives and other CNS depressants that may worsen sleep apnea and disrupt normal sleep architecture. - Sleep hygiene should be reviewed to assess factors that may improve sleep quality. - Weight management and regular exercise should be initiated or continued   Reviewed study with her in detail :  She says did not sleep well at all on the masks offered and declines being started on cpap trial at this time. Offered refer to Dr Annamaria Boots or another sleep specialist to "tweak" the mask/system" but not interested in that either - wanted second opinion re oral splinting apparatus so referred to Marice Potter for counseling and also reviewed pathophysiology of osa and wt loss issues.  Total time devoted to counseling  = 35/49m review case with pt/ discussion of options/alternatives/ giving and going over instructions (see avs)

## 2015-09-10 ENCOUNTER — Encounter (HOSPITAL_COMMUNITY): Payer: Self-pay | Admitting: Emergency Medicine

## 2015-09-10 ENCOUNTER — Emergency Department (HOSPITAL_COMMUNITY)
Admission: EM | Admit: 2015-09-10 | Discharge: 2015-09-10 | Disposition: A | Discharge status: Home / Self Care | Payer: BLUE CROSS/BLUE SHIELD | Attending: Emergency Medicine | Admitting: Emergency Medicine

## 2015-09-10 ENCOUNTER — Emergency Department (HOSPITAL_COMMUNITY): Payer: BLUE CROSS/BLUE SHIELD

## 2015-09-10 DIAGNOSIS — S8992XA Unspecified injury of left lower leg, initial encounter: Secondary | ICD-10-CM | POA: Diagnosis not present

## 2015-09-10 DIAGNOSIS — W010XXA Fall on same level from slipping, tripping and stumbling without subsequent striking against object, initial encounter: Secondary | ICD-10-CM | POA: Insufficient documentation

## 2015-09-10 DIAGNOSIS — Y9389 Activity, other specified: Secondary | ICD-10-CM | POA: Diagnosis not present

## 2015-09-10 DIAGNOSIS — Z79899 Other long term (current) drug therapy: Secondary | ICD-10-CM | POA: Insufficient documentation

## 2015-09-10 DIAGNOSIS — E785 Hyperlipidemia, unspecified: Secondary | ICD-10-CM | POA: Diagnosis not present

## 2015-09-10 DIAGNOSIS — S8991XA Unspecified injury of right lower leg, initial encounter: Secondary | ICD-10-CM | POA: Insufficient documentation

## 2015-09-10 DIAGNOSIS — Z8709 Personal history of other diseases of the respiratory system: Secondary | ICD-10-CM | POA: Insufficient documentation

## 2015-09-10 DIAGNOSIS — Z87440 Personal history of urinary (tract) infections: Secondary | ICD-10-CM | POA: Diagnosis not present

## 2015-09-10 DIAGNOSIS — Z8679 Personal history of other diseases of the circulatory system: Secondary | ICD-10-CM | POA: Diagnosis not present

## 2015-09-10 DIAGNOSIS — Z7984 Long term (current) use of oral hypoglycemic drugs: Secondary | ICD-10-CM | POA: Diagnosis not present

## 2015-09-10 DIAGNOSIS — Y92002 Bathroom of unspecified non-institutional (private) residence single-family (private) house as the place of occurrence of the external cause: Secondary | ICD-10-CM | POA: Diagnosis not present

## 2015-09-10 DIAGNOSIS — Z87891 Personal history of nicotine dependence: Secondary | ICD-10-CM | POA: Insufficient documentation

## 2015-09-10 DIAGNOSIS — M25561 Pain in right knee: Secondary | ICD-10-CM

## 2015-09-10 DIAGNOSIS — M25562 Pain in left knee: Secondary | ICD-10-CM

## 2015-09-10 DIAGNOSIS — S79911A Unspecified injury of right hip, initial encounter: Secondary | ICD-10-CM | POA: Insufficient documentation

## 2015-09-10 DIAGNOSIS — S39012A Strain of muscle, fascia and tendon of lower back, initial encounter: Secondary | ICD-10-CM | POA: Diagnosis not present

## 2015-09-10 DIAGNOSIS — E119 Type 2 diabetes mellitus without complications: Secondary | ICD-10-CM | POA: Diagnosis not present

## 2015-09-10 DIAGNOSIS — W19XXXA Unspecified fall, initial encounter: Secondary | ICD-10-CM

## 2015-09-10 DIAGNOSIS — S79912A Unspecified injury of left hip, initial encounter: Secondary | ICD-10-CM | POA: Diagnosis not present

## 2015-09-10 DIAGNOSIS — Y998 Other external cause status: Secondary | ICD-10-CM | POA: Insufficient documentation

## 2015-09-10 DIAGNOSIS — S3992XA Unspecified injury of lower back, initial encounter: Secondary | ICD-10-CM | POA: Diagnosis present

## 2015-09-10 DIAGNOSIS — Z872 Personal history of diseases of the skin and subcutaneous tissue: Secondary | ICD-10-CM | POA: Insufficient documentation

## 2015-09-10 MED ORDER — METHOCARBAMOL 500 MG PO TABS: 500.0000 mg | ORAL_TABLET | Freq: Two times a day (BID) | ORAL | Status: DC

## 2015-09-10 MED ORDER — IBUPROFEN 800 MG PO TABS
800.0000 mg | ORAL_TABLET | Freq: Once | ORAL | Status: AC
  Administered 2015-09-10: 800 mg via ORAL
  Filled 2015-09-10: qty 1

## 2015-09-10 MED ORDER — IBUPROFEN 800 MG PO TABS: 800.0000 mg | ORAL_TABLET | Freq: Three times a day (TID) | ORAL | Status: DC

## 2015-09-10 MED ORDER — HYDROCODONE-ACETAMINOPHEN 5-325 MG PO TABS
1.0000 | ORAL_TABLET | Freq: Once | ORAL | Status: AC
  Administered 2015-09-10: 1 via ORAL
  Filled 2015-09-10: qty 1

## 2015-09-10 NOTE — Discharge Instructions (Signed)
Your x-rays today were unremarkable. Take medications as prescribed as needed for pain. You may call [facility] for a follow up evaluation if your symptoms persist. Return to the ER for new or worsening symptoms.   Lumbosacral Strain Lumbosacral strain is a strain of any of the parts that make up your lumbosacral vertebrae. Your lumbosacral vertebrae are the bones that make up the lower third of your backbone. Your lumbosacral vertebrae are held together by muscles and tough, fibrous tissue (ligaments).  CAUSES  A sudden blow to your back can cause lumbosacral strain. Also, anything that causes an excessive stretch of the muscles in the low back can cause this strain. This is typically seen when people exert themselves strenuously, fall, lift heavy objects, bend, or crouch repeatedly. RISK FACTORS  Physically demanding work.  Participation in pushing or pulling sports or sports that require a sudden twist of the back (tennis, golf, baseball).  Weight lifting.  Excessive lower back curvature.  Forward-tilted pelvis.  Weak back or abdominal muscles or both.  Tight hamstrings. SIGNS AND SYMPTOMS  Lumbosacral strain may cause pain in the area of your injury or pain that moves (radiates) down your leg.  DIAGNOSIS Your health care provider can often diagnose lumbosacral strain through a physical exam. In some cases, you may need tests such as X-ray exams.  TREATMENT  Treatment for your lower back injury depends on many factors that your clinician will have to evaluate. However, most treatment will include the use of anti-inflammatory medicines. HOME CARE INSTRUCTIONS   Avoid hard physical activities (tennis, racquetball, waterskiing) if you are not in proper physical condition for it. This may aggravate or create problems.  If you have a back problem, avoid sports requiring sudden body movements. Swimming and walking are generally safer activities.  Maintain good  posture.  Maintain a healthy weight.  For acute conditions, you may put ice on the injured area.  Put ice in a plastic bag.  Place a towel between your skin and the bag.  Leave the ice on for 20 minutes, 2-3 times a day.  When the low back starts healing, stretching and strengthening exercises may be recommended. SEEK MEDICAL CARE IF:  Your back pain is getting worse.  You experience severe back pain not relieved with medicines. SEEK IMMEDIATE MEDICAL CARE IF:   You have numbness, tingling, weakness, or problems with the use of your arms or legs.  There is a change in bowel or bladder control.  You have increasing pain in any area of the body, including your belly (abdomen).  You notice shortness of breath, dizziness, or feel faint.  You feel sick to your stomach (nauseous), are throwing up (vomiting), or become sweaty.  You notice discoloration of your toes or legs, or your feet get very cold. MAKE SURE YOU:   Understand these instructions.  Will watch your condition.  Will get help right away if you are not doing well or get worse.   This information is not intended to replace advice given to you by your health care provider. Make sure you discuss any questions you have with your health care provider.   Document Released: 03/07/2005 Document Revised: 06/18/2014 Document Reviewed: 01/14/2013 Elsevier Interactive Patient Education [employer].

## 2015-09-10 NOTE — ED Provider Notes (Signed)
CSN: [id number]     Arrival date & time 09/10/15  1221 History  By signing my name below, I, Dora Sims, attest that this documentation has been prepared under the direction and in the presence of non-physician practitioner, Delrae Rend, PA-C. Electronically Signed: Dora Sims, Scribe. 09/10/2015. 12:39 PM.   Chief Complaint  Patient presents with  . Fall  . Knee Pain  . Back Pain    The history is provided by the patient. No language interpreter was used.     HPI Comments: Breanna Berger is a 55 y.o. female with h/o DM, HLD, and SVT who presents to the Emergency Department complaining of sudden onset, constant, bilateral knee and lower back pain s/p falling yesterday. Pt was shopping at a department store and slipped on water in the bathroom; she notes that she almost did a "split" and twisted her right knee during the fall; she notes that she did not hit her knees on the floor. Pt states that she woke up with severe lower back, bilateral hip, and bilateral knee pain this morning. She notes that her right hip and right knee hurt more than her left. Pt is able to ambulate but endorses pain exacerbation with ambulation as well as palpation. Pt has taken aleve and tylenol for her symptoms with minimal relief; she last took tylenol earlier this morning. She denies numbness or any other associated symptoms.  Past Medical History  Diagnosis Date  . Diabetes mellitus   . MVP (mitral valve prolapse)   . Suppurative hidradenitis 07/27/2013  . UTI (lower urinary tract infection) 07/27/2013  . Abdominal pain, unspecified site 08/24/2013  . Dyspnea 08/24/2013  . HLD (hyperlipidemia)   . Sinusitis   . SVT (supraventricular tachycardia) ([place]) 09/09/2014    converted with vagal    Past Surgical History  Procedure Laterality Date  . Hernia repair    . Cholecystectomy    . Cesarean section    . Cesarean section with bilateral tubal ligation    . Umbilical hernia repair     Family History   Problem Relation Age of Onset  . Diabetes Paternal Grandmother   . Heart disease Paternal Aunt   . Clotting disorder Mother   . Stomach cancer Maternal Aunt   . Colon cancer Maternal Aunt   . Prostate cancer Maternal Uncle   . Heart disease Paternal Grandfather   . Coronary artery disease Paternal Grandfather   . Lung cancer Maternal Uncle    Social History  Substance Use Topics  . Smoking status: Former Smoker -- 0.25 packs/day for 15 years    Types: Cigarettes    Quit date: 05/12/2015  . Smokeless tobacco: Never Used  . Alcohol Use: No   OB History    Gravida Para Term Preterm AB TAB SAB Ectopic Multiple Living   4 3 3  1 1  0 0 0 3     Review of Systems  Musculoskeletal: Positive for back pain (lower) and arthralgias (bilateral knees, bilateral hips).  Neurological: Negative for numbness.  All other systems reviewed and are negative.     Allergies  Doxycycline hyclate  Home Medications   Prior to Admission medications   Medication Sig Start Date End Date Taking? Authorizing Provider  albuterol (PROVENTIL HFA;VENTOLIN HFA) 108 (90 BASE) MCG/ACT inhaler Inhale 2 puffs into the lungs every 6 (six) hours as needed for wheezing or shortness of breath. 10/07/13   Elsie Stain, MD  atorvastatin (LIPITOR) 40 MG tablet Take 40 mg by mouth  daily.    Historical Provider, MD  glimepiride (AMARYL) 4 MG tablet Take 8 mg by mouth daily with breakfast.     Historical Provider, MD  Liraglutide (VICTOZA) 18 MG/3ML SOPN Inject 3 mLs into the skin daily.    Historical Provider, MD  metFORMIN (GLUCOPHAGE) 1000 MG tablet Take 1 tablet (1,000 mg total) by mouth 2 (two) times daily with a meal. 12/09/13   Tresa Garter, MD  metoprolol succinate (TOPROL-XL) 50 MG 24 hr tablet Take 50 mg by mouth daily. Take with or immediately following a meal.    Historical Provider, MD  polyethylene glycol (MIRALAX / GLYCOLAX) packet Take drink 2 glasses daily until large bowel movement in drink one  glass daily Patient taking differently: Take 17 g by mouth daily as needed for mild constipation.  09/14/13   Leonard Schwartz, MD   BP 123/73 mmHg  Pulse 88  Temp(Src) 98.1 F (36.7 C) (Oral)  Resp 18  SpO2 98% Physical Exam  Constitutional: She is oriented to person, place, and time. She appears well-developed and well-nourished. No distress.  HENT:  Head: Normocephalic and atraumatic.  Eyes: Conjunctivae and EOM are normal.  Neck: Normal range of motion. Neck supple.  FROM without pain  Cardiovascular: Normal rate.   Pulmonary/Chest: Effort normal. No respiratory distress.  Musculoskeletal: Normal range of motion.  Right knee with diffuse mild ttp. Mildly edematous. No fluid palpated. FROM. No laxity or crepitus.  Left knee nontender.  No c-spine, t-spine, or l-spine tednerness. Diffuse bilateral lumbosacral paraspinal ttp. No stepoff or deformity.   5/5 strength in bilateral UE and LE  2+ distal pulses  Neurological: She is alert and oriented to person, place, and time. She has normal reflexes.  Skin: Skin is warm and dry.  Psychiatric: She has a normal mood and affect. Her behavior is normal.  Nursing note and vitals reviewed.   ED Course  Procedures (including critical care time)  DIAGNOSTIC STUDIES: Oxygen Saturation is 98% on RA, normal by my interpretation.    COORDINATION OF CARE: 12:40 PM Will administer hydrocodone tablet and ibuprofen 800 mg. Will order DG Knee Complete 4 Views Right and Left, DG Lumbar Spine Complete, and DG Sacrum/Coccyx. Discussed treatment plan with pt at bedside and pt agreed to plan.  Labs Review Labs Reviewed - No data to display  Imaging Review Dg Lumbar Spine Complete  09/10/2015  CLINICAL DATA:  Fall EXAM: LUMBAR SPINE - COMPLETE 4+ VIEW COMPARISON:  None. FINDINGS: There is anatomic alignment of the vertebral bodies. Mild narrowing of the L4-5 disc. No vertebral compression deformity. There is mild degenerative change of the SI  joints. IMPRESSION: No acute bony pathology.  Degenerative change. Electronically Signed   By: Marybelle Killings M.D.   On: 09/10/2015 13:36   Dg Sacrum/coccyx  09/10/2015  CLINICAL DATA:  Fall EXAM: SACRUM AND COCCYX - 2+ VIEW COMPARISON:  None. FINDINGS: No acute fracture. No dislocation. Unremarkable soft tissues. Mild degenerative change of the SI joints. IMPRESSION: No acute bony pathology. Electronically Signed   By: Marybelle Killings M.D.   On: 09/10/2015 13:38   Dg Knee Complete 4 Views Left  09/10/2015  CLINICAL DATA:  [place] yesterday at [facility] in restroom, knee pain EXAM: LEFT KNEE - COMPLETE 4+ VIEW COMPARISON:  None FINDINGS: Osseous mineralization grossly normal for technique. Mild joint space narrowing trickling medial compartment. No acute fracture, dislocation or bone destruction No knee joint effusion. IMPRESSION: Minimal degenerative changes without acute bony abnormalities. Electronically Signed  By: Lavonia Dana M.D.   On: 09/10/2015 13:41   Dg Knee Complete 4 Views Right  09/10/2015  CLINICAL DATA:  [place] yesterday at [facility] in restroom, knee pain EXAM: RIGHT KNEE - COMPLETE 4+ VIEW COMPARISON:  07/28/2012 FINDINGS: Osseous mineralization normal for technique. Minimal spur formation and joint space narrowing. No acute fracture, dislocation or bone destruction. No knee joint effusion. IMPRESSION: Minimal degenerative changes. No acute abnormalities Electronically Signed   By: Lavonia Dana M.D.   On: 09/10/2015 13:40   I have personally reviewed and evaluated these images as part of my medical decision-making.   EKG Interpretation None      MDM   Final diagnoses:  Fall, initial encounter  Low back strain, initial encounter  Bilateral knee pain    I originally discussed with pt only obtaining x-ray of right knee given tenderness and mild swelling. However, pt is quite anxious and would like x-rays of her back and both knees "to be safe." Given mechanism of injury and her concern I think  this is reasonable. X-ray of lumbar spine and sacrum/coccyx were ordered and were negative for acute findings. Bilateral knee x-rays were also obtained and reveal minimal degenerative changes with no acute abnormalities. Discussed these findings with pt and her daughter. Pt's pain is improved with norco and ibuprofen in the ED. She is ambulatory unassisted with steady gait. She is otherwise nontoxic appearing and stable for discharge. Rx given for ibuprofen and robaxin to take as needed. Referral also given to ortho to f/u if symptoms persist. Instructed to f/u with PCP. ER return precautions given.  At time of discharge by RN pt is requesting bilateral knee sleeves. I discussed with pt that I really do not think bilateral knee sleeves are warranted or appropriate at this time. I discussed with her RICE therapy again, symptomatic medications, and weight bearing/ROM as tolerated. However, she is very insistent. I agreed that we can provide knee sleeve for right knee given that is causing her more pain. She is in agreement.    I personally performed the services described in this documentation, which was scribed in my presence. The recorded information has been reviewed and is accurate.    Anne Ng, PA-C 09/10/15 [place], MD 09/15/15 2312

## 2015-09-10 NOTE — ED Notes (Signed)
Slipped on water in [place] yesterday. C/o bilateral knee pain and lower back pain. States her right knee hurts more than her left. No obvious deformities observed in triage.

## 2015-09-22 ENCOUNTER — Other Ambulatory Visit (HOSPITAL_COMMUNITY): Payer: Self-pay | Admitting: Sports Medicine

## 2015-09-22 DIAGNOSIS — M2391 Unspecified internal derangement of right knee: Secondary | ICD-10-CM

## 2015-09-28 ENCOUNTER — Ambulatory Visit (HOSPITAL_COMMUNITY)
Admission: RE | Admit: 2015-09-28 | Discharge: 2015-09-28 | Disposition: A | Discharge status: Home / Self Care | Payer: BLUE CROSS/BLUE SHIELD | Source: Ambulatory Visit | Attending: Sports Medicine | Admitting: Sports Medicine

## 2015-09-28 DIAGNOSIS — M7121 Synovial cyst of popliteal space [Baker], right knee: Secondary | ICD-10-CM | POA: Diagnosis not present

## 2015-09-28 DIAGNOSIS — X58XXXA Exposure to other specified factors, initial encounter: Secondary | ICD-10-CM | POA: Diagnosis not present

## 2015-09-28 DIAGNOSIS — M2391 Unspecified internal derangement of right knee: Secondary | ICD-10-CM

## 2015-09-28 DIAGNOSIS — S83411A Sprain of medial collateral ligament of right knee, initial encounter: Secondary | ICD-10-CM | POA: Insufficient documentation

## 2015-09-28 DIAGNOSIS — M25761 Osteophyte, right knee: Secondary | ICD-10-CM | POA: Diagnosis not present

## 2015-09-28 DIAGNOSIS — S83281A Other tear of lateral meniscus, current injury, right knee, initial encounter: Secondary | ICD-10-CM | POA: Diagnosis not present

## 2015-09-28 DIAGNOSIS — M25461 Effusion, right knee: Secondary | ICD-10-CM | POA: Diagnosis not present

## 2015-10-07 ENCOUNTER — Other Ambulatory Visit: Payer: Self-pay | Admitting: Internal Medicine

## 2015-10-07 ENCOUNTER — Other Ambulatory Visit: Payer: Self-pay | Admitting: Physician Assistant

## 2015-10-07 DIAGNOSIS — N63 Unspecified lump in unspecified breast: Secondary | ICD-10-CM

## 2015-10-07 DIAGNOSIS — D281 Benign neoplasm of vagina: Secondary | ICD-10-CM

## 2015-10-12 ENCOUNTER — Ambulatory Visit
Admission: RE | Admit: 2015-10-12 | Discharge: 2015-10-12 | Disposition: A | Discharge status: Home / Self Care | Payer: BLUE CROSS/BLUE SHIELD | Source: Ambulatory Visit | Attending: Physician Assistant | Admitting: Physician Assistant

## 2015-10-12 ENCOUNTER — Other Ambulatory Visit: Payer: Self-pay | Admitting: Internal Medicine

## 2015-10-12 ENCOUNTER — Other Ambulatory Visit: Payer: BLUE CROSS/BLUE SHIELD

## 2015-10-12 DIAGNOSIS — N63 Unspecified lump in unspecified breast: Secondary | ICD-10-CM

## 2015-10-12 DIAGNOSIS — D281 Benign neoplasm of vagina: Secondary | ICD-10-CM

## 2015-10-14 ENCOUNTER — Ambulatory Visit
Admission: RE | Admit: 2015-10-14 | Discharge: 2015-10-14 | Disposition: A | Discharge status: Home / Self Care | Payer: BLUE CROSS/BLUE SHIELD | Source: Ambulatory Visit | Attending: Internal Medicine | Admitting: Internal Medicine

## 2015-10-14 DIAGNOSIS — N63 Unspecified lump in unspecified breast: Secondary | ICD-10-CM

## 2016-01-27 ENCOUNTER — Emergency Department (HOSPITAL_COMMUNITY)
Admission: EM | Admit: 2016-01-27 | Discharge: 2016-01-28 | Disposition: A | Discharge status: Home / Self Care | Payer: BLUE CROSS/BLUE SHIELD | Attending: Emergency Medicine | Admitting: Emergency Medicine

## 2016-01-27 ENCOUNTER — Encounter (HOSPITAL_COMMUNITY): Payer: Self-pay

## 2016-01-27 DIAGNOSIS — R21 Rash and other nonspecific skin eruption: Secondary | ICD-10-CM | POA: Diagnosis present

## 2016-01-27 DIAGNOSIS — Z792 Long term (current) use of antibiotics: Secondary | ICD-10-CM | POA: Diagnosis not present

## 2016-01-27 DIAGNOSIS — Z791 Long term (current) use of non-steroidal anti-inflammatories (NSAID): Secondary | ICD-10-CM | POA: Diagnosis not present

## 2016-01-27 DIAGNOSIS — E119 Type 2 diabetes mellitus without complications: Secondary | ICD-10-CM | POA: Insufficient documentation

## 2016-01-27 DIAGNOSIS — Z7984 Long term (current) use of oral hypoglycemic drugs: Secondary | ICD-10-CM | POA: Diagnosis not present

## 2016-01-27 DIAGNOSIS — T7840XA Allergy, unspecified, initial encounter: Secondary | ICD-10-CM | POA: Diagnosis not present

## 2016-01-27 DIAGNOSIS — J449 Chronic obstructive pulmonary disease, unspecified: Secondary | ICD-10-CM | POA: Insufficient documentation

## 2016-01-27 DIAGNOSIS — Z87891 Personal history of nicotine dependence: Secondary | ICD-10-CM | POA: Diagnosis not present

## 2016-01-27 MED ORDER — SODIUM CHLORIDE 0.9 % IV BOLUS (SEPSIS): 1000.0000 mL | Freq: Once | INTRAVENOUS | Status: DC

## 2016-01-27 MED ORDER — FAMOTIDINE 20 MG PO TABS
20.0000 mg | ORAL_TABLET | Freq: Once | ORAL | Status: AC
  Administered 2016-01-28: 20 mg via ORAL
  Filled 2016-01-27: qty 1

## 2016-01-27 MED ORDER — FAMOTIDINE IN NACL 20-0.9 MG/50ML-% IV SOLN
20.0000 mg | Freq: Once | INTRAVENOUS | Status: DC
  Filled 2016-01-27: qty 50

## 2016-01-27 MED ORDER — METHYLPREDNISOLONE SODIUM SUCC 125 MG IJ SOLR
125.0000 mg | Freq: Once | INTRAMUSCULAR | Status: AC
  Administered 2016-01-27: 125 mg via INTRAVENOUS
  Filled 2016-01-27: qty 2

## 2016-01-27 NOTE — ED Triage Notes (Signed)
PT C/O RED, ITCHY WELTS WITH FACIAL SWELLING AFTER TAKING 1 CAP OF KEFLEX 500MG  AND ERYTHROMYCIN 5MG  OINTMENT INTO THE RIGHT EYE X1 HR AGO. PT DENIES SOB OR THROAT SWELLING. PT STS SHE TOOK BENADRYL 2 CAPS PTA.

## 2016-01-27 NOTE — ED Notes (Signed)
Patient is alert and oriented x4.  She presents with a generalized body rash after taking keflex this evening.   Patient states that she does have some tingling in her tongue with no airway issues.  Patient adds that she did Take two benadryl before coming to the Ed.  Patient denies any pain.

## 2016-01-27 NOTE — ED Notes (Signed)
Bed: [id number] Expected date:  Expected time:  Means of arrival:  Comments: t2

## 2016-01-27 NOTE — ED Provider Notes (Signed)
[facility] DEPT Provider Note   CSN: [id number] Arrival date & time: 01/27/16  2156     History   Chief Complaint Chief Complaint  Patient presents with  . Allergic Reaction    HPI Zanijah Halfmann is a 55 y.o. female.  The history is provided by the patient.  Allergic Reaction  Presenting symptoms: itching, rash and swelling   Severity:  Moderate Duration:  1 hour Prior allergic episodes:  No prior episodes Context: medications   Context comment:  Started Keflex tonight for sinus issues Relieved by:  Antihistamines Worsened by:  Nothing Ineffective treatments:  None tried   Past Medical History:  Diagnosis Date  . Abdominal pain, unspecified site 08/24/2013  . Diabetes mellitus   . Dyspnea 08/24/2013  . HLD (hyperlipidemia)   . MVP (mitral valve prolapse)   . Sinusitis   . Suppurative hidradenitis 07/27/2013  . SVT (supraventricular tachycardia) ([place]) 09/09/2014   converted with vagal   . UTI (lower urinary tract infection) 07/27/2013    Patient Active Problem List   Diagnosis Date Noted  . Severe obesity (BMI >= 40) ([place]) 06/29/2015  . Rectal pain 09/23/2013  . OSA (obstructive sleep apnea) 09/08/2013  . COPD GOLD 0  09/08/2013  . Abdominal pain, unspecified site 08/24/2013  . Dyspnea 08/24/2013  . Weight gain 07/27/2013  . Postmenopausal vaginal bleeding 03/26/2013  . Healthcare maintenance 12/30/2012  . FATIGUE 03/31/2010  . PLANTAR FASCIITIS, BILATERAL 01/06/2010  . EDEMA 01/06/2010  . LEUKOCYTOSIS, CHRONIC 11/25/2009  . TOBACCO ABUSE 11/25/2009  . PELVIC  PAIN 11/25/2009  . ALLERGIC RHINITIS, SEASONAL 05/12/2009  . MICROALBUMINURIA 10/21/2007  . ABDOMINAL BLOATING 03/28/2007  . DYSTHYMIA, SITUATIONAL 03/18/2007  . FREQUENCY, URINARY 03/18/2007  . DM, UNCOMPLICATED, TYPE II 123XX123  . HYPERPLASIA, PERSISTENT THYMUS 11/19/2006  . MITRAL VALVE PROLAPSE 11/19/2006  . PSVT 11/19/2006  . Nonspecific (abnormal) findings on radiological and  other examination of body structure 10/31/2006  . HYPERLIPIDEMIA, MIXED 08/23/2006  . ANEMIA, IRON DEFICIENCY, MICROCYTIC 08/23/2006    Past Surgical History:  Procedure Laterality Date  . CESAREAN SECTION    . CESAREAN SECTION WITH BILATERAL TUBAL LIGATION    . CHOLECYSTECTOMY    . HERNIA REPAIR    . UMBILICAL HERNIA REPAIR      OB History    Gravida Para Term Preterm AB Living   4 3 3   1 3    SAB TAB Ectopic Multiple Live Births   0 1 0 0         Home Medications    Prior to Admission medications   Medication Sig Start Date End Date Taking? Authorizing Provider  albuterol (PROVENTIL HFA;VENTOLIN HFA) 108 (90 BASE) MCG/ACT inhaler Inhale 2 puffs into the lungs every 6 (six) hours as needed for wheezing or shortness of breath. 10/07/13   Elsie Stain, MD  amoxicillin-clavulanate (AUGMENTIN) 500-125 MG tablet Take 1 tablet by mouth 3 (three) times daily.  09/09/15   Historical Provider, MD  glimepiride (AMARYL) 4 MG tablet Take 4 mg by mouth 2 (two) times daily.     Historical Provider, MD  ibuprofen (ADVIL,MOTRIN) 800 MG tablet Take 1 tablet (800 mg total) by mouth 3 (three) times daily. 09/10/15   Olivia Canter Sam, PA-C  Liraglutide (VICTOZA) 18 MG/3ML SOPN Inject 3 mLs into the skin daily.    Historical Provider, MD  metFORMIN (GLUCOPHAGE) 1000 MG tablet Take 1 tablet (1,000 mg total) by mouth 2 (two) times daily with a meal. 12/09/13   Olugbemiga  Essie Christine, MD  methocarbamol (ROBAXIN) 500 MG tablet Take 1 tablet (500 mg total) by mouth 2 (two) times daily. 09/10/15   Olivia Canter Sam, PA-C  metoprolol succinate (TOPROL-XL) 50 MG 24 hr tablet Take 50 mg by mouth daily. Take with or immediately following a meal.    Historical Provider, MD  polyethylene glycol (MIRALAX / GLYCOLAX) packet Take drink 2 glasses daily until large bowel movement in drink one glass daily Patient not taking: Reported on 09/10/2015 09/14/13   Leonard Schwartz, MD    Family History Family History  Problem Relation Age of  Onset  . Diabetes Paternal Grandmother   . Heart disease Paternal Aunt   . Clotting disorder Mother   . Stomach cancer Maternal Aunt   . Colon cancer Maternal Aunt   . Prostate cancer Maternal Uncle   . Heart disease Paternal Grandfather   . Coronary artery disease Paternal Grandfather   . Lung cancer Maternal Uncle     Social History Social History  Substance Use Topics  . Smoking status: Former Smoker    Packs/day: 0.25    Years: 15.00    Types: Cigarettes    Quit date: 05/12/2015  . Smokeless tobacco: Never Used  . Alcohol use No     Allergies   Erythromycin; Keflex [cephalexin]; and Doxycycline hyclate   Review of Systems Review of Systems  Eyes: Positive for pain and redness.       Recently diagnosed with a stye in the right eye. Started on erythromycin and Keflex  Skin: Positive for itching and rash.  All other systems reviewed and are negative.    Physical Exam Updated Vital Signs BP 146/75 (BP Location: Right Arm)   Pulse 80   Temp 98.6 F (37 C) (Oral)   Resp 20   Ht 5\' 8"  (1.727 m)   Wt 270 lb (122.5 kg)   SpO2 99%   BMI 41.05 kg/m   Physical Exam  Constitutional: She is oriented to person, place, and time. She appears well-developed and well-nourished. No distress.  HENT:  Head: Normocephalic and atraumatic.  Mouth/Throat: Oropharynx is clear and moist.  No tongue or uvula swelling  Eyes: Conjunctivae and EOM are normal. Pupils are equal, round, and reactive to light.  Neck: Normal range of motion. Neck supple.  Cardiovascular: Normal rate, regular rhythm and intact distal pulses.   No murmur heard. Pulmonary/Chest: Effort normal and breath sounds normal. No respiratory distress. She has no wheezes. She has no rales.  Abdominal: Soft. She exhibits no distension. There is no tenderness. There is no rebound and no guarding.  Musculoskeletal: Normal range of motion. She exhibits no edema or tenderness.  Neurological: She is alert and oriented to  person, place, and time.  Skin: Skin is warm and dry. Rash noted. No erythema.  Urticarial rash over the upper and lower extremities as well as the trunk  Psychiatric: She has a normal mood and affect. Her behavior is normal.  Nursing note and vitals reviewed.    ED Treatments / Results  Labs (all labs ordered are listed, but only abnormal results are displayed) Labs Reviewed - No data to display  EKG  EKG Interpretation None       Radiology No results found.  Procedures Procedures (including critical care time)  Medications Ordered in ED Medications  famotidine (PEPCID) IVPB 20 mg premix (not administered)  methylPREDNISolone sodium succinate (SOLU-MEDROL) 125 mg/2 mL injection 125 mg (not administered)  sodium chloride 0.9 % bolus 1,000 mL (  not administered)     Initial Impression / Assessment and Plan / ED Course  I have reviewed the triage vital signs and the nursing notes.  Pertinent labs & imaging results that were available during my care of the patient were reviewed by me and considered in my medical decision making (see chart for details).  Clinical Course    Patient presenting today with an allergic reaction after taking Keflex. She took 2 Benadryl at home with improvement but has persistent rash without airway complaints. No wheezing her tongue swelling on exam. Patient given Solu-Medrol and Pepcid. Will monitor for improvement.    12:23 AM Pt feeling better and d/ced home.  Final Clinical Impressions(s) / ED Diagnoses   Final diagnoses:  Allergic reaction, initial encounter    New Prescriptions New Prescriptions   FAMOTIDINE (PEPCID) 20 MG TABLET    Take 1 tablet (20 mg total) by mouth 2 (two) times daily.   PREDNISONE (DELTASONE) 20 MG TABLET    Take 2 tablets (40 mg total) by mouth daily.     Blanchie Dessert, MD 01/28/16 [id number]

## 2016-01-28 MED ORDER — FAMOTIDINE 20 MG PO TABS: 20.0000 mg | ORAL_TABLET | Freq: Two times a day (BID) | ORAL | 0 refills | Status: DC

## 2016-01-28 MED ORDER — PREDNISONE 20 MG PO TABS: 40.0000 mg | ORAL_TABLET | Freq: Every day | ORAL | 0 refills | Status: DC

## 2016-01-28 NOTE — ED Notes (Signed)
Patient is alert and oriented x3.  She was given DC instructions and follow up visit instructions.  Patient gave verbal understanding. She was DC ambulatory under her own power to home.  V/S stable.  He was not showing any signs of distress on DC 

## 2016-04-19 ENCOUNTER — Emergency Department (HOSPITAL_COMMUNITY)
Admission: EM | Admit: 2016-04-19 | Discharge: 2016-04-19 | Disposition: A | Discharge status: Home / Self Care | Payer: BLUE CROSS/BLUE SHIELD | Attending: Emergency Medicine | Admitting: Emergency Medicine

## 2016-04-19 ENCOUNTER — Encounter (HOSPITAL_COMMUNITY): Payer: Self-pay | Admitting: Emergency Medicine

## 2016-04-19 ENCOUNTER — Emergency Department (HOSPITAL_COMMUNITY): Payer: BLUE CROSS/BLUE SHIELD

## 2016-04-19 DIAGNOSIS — Z87891 Personal history of nicotine dependence: Secondary | ICD-10-CM | POA: Insufficient documentation

## 2016-04-19 DIAGNOSIS — Y999 Unspecified external cause status: Secondary | ICD-10-CM | POA: Insufficient documentation

## 2016-04-19 DIAGNOSIS — Z7984 Long term (current) use of oral hypoglycemic drugs: Secondary | ICD-10-CM | POA: Insufficient documentation

## 2016-04-19 DIAGNOSIS — Y939 Activity, unspecified: Secondary | ICD-10-CM | POA: Diagnosis not present

## 2016-04-19 DIAGNOSIS — M542 Cervicalgia: Secondary | ICD-10-CM | POA: Insufficient documentation

## 2016-04-19 DIAGNOSIS — E119 Type 2 diabetes mellitus without complications: Secondary | ICD-10-CM | POA: Insufficient documentation

## 2016-04-19 DIAGNOSIS — M25561 Pain in right knee: Secondary | ICD-10-CM | POA: Diagnosis not present

## 2016-04-19 DIAGNOSIS — Y92410 Unspecified street and highway as the place of occurrence of the external cause: Secondary | ICD-10-CM | POA: Diagnosis not present

## 2016-04-19 DIAGNOSIS — M545 Low back pain: Secondary | ICD-10-CM | POA: Diagnosis not present

## 2016-04-19 MED ORDER — METHOCARBAMOL 500 MG PO TABS: 500.0000 mg | ORAL_TABLET | Freq: Two times a day (BID) | ORAL | 0 refills | Status: DC

## 2016-04-19 MED ORDER — LIDOCAINE 5 % EX PTCH: 1.0000 | MEDICATED_PATCH | CUTANEOUS | 0 refills | Status: DC

## 2016-04-19 MED ORDER — IBUPROFEN 800 MG PO TABS: 800.0000 mg | ORAL_TABLET | Freq: Three times a day (TID) | ORAL | 0 refills | Status: DC

## 2016-04-19 NOTE — Discharge Instructions (Signed)
Expect your soreness to increase over the next 2-3 days. Take it easy, but do not lay around too much as this may make the stiffness worse. Take 500 mg of naproxen every 12 hours or 800 mg of ibuprofen every 8 hours for the next 3 days. Take these medications with food to avoid upset stomach. Robaxin is a muscle relaxer and may help loosen stiff muscles. Do not take the Robaxin while driving or performing other dangerous activities. Be sure to perform the attached exercises starting with three times a week and working up to performing them daily. This is an essential part of preventing long term problems.
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
## 2016-06-05 ENCOUNTER — Other Ambulatory Visit: Payer: Self-pay | Admitting: Physician Assistant

## 2016-06-06 ENCOUNTER — Other Ambulatory Visit: Payer: Self-pay | Admitting: Physician Assistant

## 2016-06-14 ENCOUNTER — Other Ambulatory Visit: Payer: Self-pay | Admitting: Neurological Surgery

## 2016-06-14 DIAGNOSIS — M545 Low back pain: Secondary | ICD-10-CM

## 2016-06-14 DIAGNOSIS — M542 Cervicalgia: Secondary | ICD-10-CM

## 2016-06-15 ENCOUNTER — Ambulatory Visit
Admission: RE | Admit: 2016-06-15 | Discharge: 2016-06-15 | Disposition: A | Discharge status: Home / Self Care | Payer: BLUE CROSS/BLUE SHIELD | Source: Ambulatory Visit | Attending: Neurological Surgery | Admitting: Neurological Surgery

## 2016-06-15 DIAGNOSIS — M545 Low back pain: Secondary | ICD-10-CM

## 2016-06-15 DIAGNOSIS — M542 Cervicalgia: Secondary | ICD-10-CM

## 2016-06-18 ENCOUNTER — Ambulatory Visit: Payer: Self-pay | Admitting: Neurology

## 2016-09-20 ENCOUNTER — Other Ambulatory Visit: Payer: Self-pay | Admitting: Internal Medicine

## 2016-09-20 DIAGNOSIS — R1900 Intra-abdominal and pelvic swelling, mass and lump, unspecified site: Secondary | ICD-10-CM

## 2016-09-24 ENCOUNTER — Ambulatory Visit
Admission: RE | Admit: 2016-09-24 | Discharge: 2016-09-24 | Disposition: A | Discharge status: Home / Self Care | Payer: BLUE CROSS/BLUE SHIELD | Source: Ambulatory Visit | Attending: Internal Medicine | Admitting: Internal Medicine

## 2016-09-24 DIAGNOSIS — R1900 Intra-abdominal and pelvic swelling, mass and lump, unspecified site: Secondary | ICD-10-CM

## 2016-09-25 ENCOUNTER — Emergency Department (HOSPITAL_COMMUNITY): Payer: BLUE CROSS/BLUE SHIELD

## 2016-09-25 ENCOUNTER — Encounter (HOSPITAL_COMMUNITY): Payer: Self-pay

## 2016-09-25 ENCOUNTER — Emergency Department (HOSPITAL_COMMUNITY)
Admission: EM | Admit: 2016-09-25 | Discharge: 2016-09-25 | Disposition: A | Discharge status: Home / Self Care | Payer: BLUE CROSS/BLUE SHIELD | Attending: Emergency Medicine | Admitting: Emergency Medicine

## 2016-09-25 DIAGNOSIS — Z7984 Long term (current) use of oral hypoglycemic drugs: Secondary | ICD-10-CM | POA: Diagnosis not present

## 2016-09-25 DIAGNOSIS — R000 Tachycardia, unspecified: Secondary | ICD-10-CM | POA: Diagnosis present

## 2016-09-25 DIAGNOSIS — Z87891 Personal history of nicotine dependence: Secondary | ICD-10-CM | POA: Insufficient documentation

## 2016-09-25 DIAGNOSIS — R1084 Generalized abdominal pain: Secondary | ICD-10-CM | POA: Insufficient documentation

## 2016-09-25 DIAGNOSIS — J449 Chronic obstructive pulmonary disease, unspecified: Secondary | ICD-10-CM | POA: Diagnosis not present

## 2016-09-25 DIAGNOSIS — E1165 Type 2 diabetes mellitus with hyperglycemia: Secondary | ICD-10-CM | POA: Insufficient documentation

## 2016-09-25 DIAGNOSIS — I471 Supraventricular tachycardia: Secondary | ICD-10-CM

## 2016-09-25 LAB — COMPREHENSIVE METABOLIC PANEL
ALT: 24 U/L (ref 14–54)
AST: 24 U/L (ref 15–41)
Albumin: 3.8 g/dL (ref 3.5–5.0)
Alkaline Phosphatase: 90 U/L (ref 38–126)
Anion gap: 9 (ref 5–15)
BUN: 9 mg/dL (ref 6–20)
CHLORIDE: 104 mmol/L (ref 101–111)
CO2: 23 mmol/L (ref 22–32)
Calcium: 9.3 mg/dL (ref 8.9–10.3)
Creatinine, Ser: 0.72 mg/dL (ref 0.44–1.00)
Glucose, Bld: 188 mg/dL — ABNORMAL HIGH (ref 65–99)
Potassium: 3.9 mmol/L (ref 3.5–5.1)
Sodium: 136 mmol/L (ref 135–145)
Total Bilirubin: 0.4 mg/dL (ref 0.3–1.2)
Total Protein: 7.4 g/dL (ref 6.5–8.1)

## 2016-09-25 LAB — URINALYSIS, ROUTINE W REFLEX MICROSCOPIC
BILIRUBIN URINE: NEGATIVE
Glucose, UA: NEGATIVE mg/dL
Hgb urine dipstick: NEGATIVE
KETONES UR: NEGATIVE mg/dL
Nitrite: NEGATIVE
Protein, ur: 30 mg/dL — AB
Specific Gravity, Urine: 1.017 (ref 1.005–1.030)
pH: 5 (ref 5.0–8.0)

## 2016-09-25 LAB — CBC
HEMATOCRIT: 42.1 % (ref 36.0–46.0)
Hemoglobin: 13.9 g/dL (ref 12.0–15.0)
MCH: 24.3 pg — ABNORMAL LOW (ref 26.0–34.0)
MCHC: 33 g/dL (ref 30.0–36.0)
MCV: 73.5 fL — AB (ref 78.0–100.0)
Platelets: 234 10*3/uL (ref 150–400)
RBC: 5.73 MIL/uL — AB (ref 3.87–5.11)
RDW: 15.6 % — ABNORMAL HIGH (ref 11.5–15.5)
WBC: 15.3 10*3/uL — AB (ref 4.0–10.5)

## 2016-09-25 LAB — LIPASE, BLOOD: LIPASE: 34 U/L (ref 11–51)

## 2016-09-25 MED ORDER — IOPAMIDOL (ISOVUE-300) INJECTION 61%
INTRAVENOUS | Status: AC
  Filled 2016-09-25: qty 100

## 2016-09-25 MED ORDER — ADENOSINE 6 MG/2ML IV SOLN
12.0000 mg | Freq: Once | INTRAVENOUS | Status: AC
  Administered 2016-09-25: 12 mg via INTRAVENOUS

## 2016-09-25 MED ORDER — IOPAMIDOL (ISOVUE-300) INJECTION 61%
INTRAVENOUS | Status: AC
  Administered 2016-09-25: 100 mL
  Filled 2016-09-25: qty 100

## 2016-09-25 MED ORDER — SODIUM CHLORIDE 0.9 % IV BOLUS (SEPSIS)
1000.0000 mL | Freq: Once | INTRAVENOUS | Status: AC
  Administered 2016-09-25: 1000 mL via INTRAVENOUS

## 2016-09-25 NOTE — ED Notes (Signed)
12 mg of adenosine given. Pt tolerated well.

## 2016-09-25 NOTE — ED Provider Notes (Addendum)
[facility] DEPT Provider Note   CSN: [id number] Arrival date & time: 09/25/16  1804     History   Chief Complaint Chief Complaint  Patient presents with  . Tachycardia  . Abdominal Pain    HPI Breanna Berger is a 56 y.o. female.Patient complains of bloating in her abdomen diffusely and a "knot" at periumbilical area for the past 3 days. She feels constipated though she had a bowel movement earlier today. Appetite has been diminished over the past 2 days. Denies nausea or vomiting. She also complains of palpitations and racing heart which started 30 minutes ago. Denies chest pain denies shortness of breath. No treatment prior to coming here. Patient had abdominal ultrasound performed earlier today as outpatient, showing w no discrete abnormality. Nothing makes symptoms better or worse. No other associated symptoms  HPI  Past Medical History:  Diagnosis Date  . Abdominal pain, unspecified site 08/24/2013  . Diabetes mellitus   . Dyspnea 08/24/2013  . HLD (hyperlipidemia)   . MVP (mitral valve prolapse)   . Sinusitis   . Suppurative hidradenitis 07/27/2013  . SVT (supraventricular tachycardia) ([place]) 09/09/2014   converted with vagal   . UTI (lower urinary tract infection) 07/27/2013    Patient Active Problem List   Diagnosis Date Noted  . Severe obesity (BMI >= 40) ([place]) 06/29/2015  . Rectal pain 09/23/2013  . OSA (obstructive sleep apnea) 09/08/2013  . COPD GOLD 0  09/08/2013  . Abdominal pain, unspecified site 08/24/2013  . Dyspnea 08/24/2013  . Weight gain 07/27/2013  . Postmenopausal vaginal bleeding 03/26/2013  . Healthcare maintenance 12/30/2012  . FATIGUE 03/31/2010  . PLANTAR FASCIITIS, BILATERAL 01/06/2010  . EDEMA 01/06/2010  . LEUKOCYTOSIS, CHRONIC 11/25/2009  . TOBACCO ABUSE 11/25/2009  . PELVIC  PAIN 11/25/2009  . ALLERGIC RHINITIS, SEASONAL 05/12/2009  . MICROALBUMINURIA 10/21/2007  . ABDOMINAL BLOATING 03/28/2007  . DYSTHYMIA, SITUATIONAL  03/18/2007  . FREQUENCY, URINARY 03/18/2007  . DM, UNCOMPLICATED, TYPE II 40/81/4481  . HYPERPLASIA, PERSISTENT THYMUS 11/19/2006  . MITRAL VALVE PROLAPSE 11/19/2006  . PSVT 11/19/2006  . Nonspecific (abnormal) findings on radiological and other examination of body structure 10/31/2006  . HYPERLIPIDEMIA, MIXED 08/23/2006  . ANEMIA, IRON DEFICIENCY, MICROCYTIC 08/23/2006    Past Surgical History:  Procedure Laterality Date  . CESAREAN SECTION    . CESAREAN SECTION WITH BILATERAL TUBAL LIGATION    . CHOLECYSTECTOMY    . HERNIA REPAIR    . UMBILICAL HERNIA REPAIR      OB History    Gravida Para Term Preterm AB Living   4 3 3   1 3    SAB TAB Ectopic Multiple Live Births   0 1 0 0         Home Medications    Prior to Admission medications   Medication Sig Start Date End Date Taking? Authorizing Provider  albuterol (PROVENTIL HFA;VENTOLIN HFA) 108 (90 BASE) MCG/ACT inhaler Inhale 2 puffs into the lungs every 6 (six) hours as needed for wheezing or shortness of breath. 10/07/13   Elsie Stain, MD  amoxicillin-clavulanate (AUGMENTIN) 500-125 MG tablet Take 1 tablet by mouth 3 (three) times daily.  09/09/15   Historical Provider, MD  atorvastatin (LIPITOR) 40 MG tablet Take 40 mg by mouth every evening. 12/20/15   Historical Provider, MD  cephALEXin (KEFLEX) 500 MG capsule Take 500 mg by mouth 2 (two) times daily. For 10 days starting 8/18 01/27/16   Historical Provider, MD  Cholecalciferol (VITAMIN D3) 50000 units CAPS Take 1 tablet  by mouth every 30 (thirty) days. 12/04/15   Historical Provider, MD  diazepam (VALIUM) 10 MG tablet Take 1 tablet by mouth daily as needed. 01/03/16   Historical Provider, MD  erythromycin ophthalmic ointment Place 1 application into both eyes as directed. 01/27/16   Historical Provider, MD  famotidine (PEPCID) 20 MG tablet Take 1 tablet (20 mg total) by mouth 2 (two) times daily. 01/28/16   Blanchie Dessert, MD  glimepiride (AMARYL) 4 MG tablet Take 4 mg by  mouth 2 (two) times daily.     Historical Provider, MD  ibuprofen (ADVIL,MOTRIN) 800 MG tablet Take 1 tablet (800 mg total) by mouth 3 (three) times daily. 09/10/15   Olivia Canter Ninoshka Wainwright, PA-C  ibuprofen (ADVIL,MOTRIN) 800 MG tablet Take 1 tablet (800 mg total) by mouth 3 (three) times daily. 04/19/16   Shawn C Joy, PA-C  lidocaine (LIDODERM) 5 % Place 1 patch onto the skin daily. Remove & Discard patch within 12 hours or as directed by MD 04/19/16   Helane Gunther Joy, PA-C  Liraglutide (VICTOZA) 18 MG/3ML SOPN Inject 3 mLs into the skin daily.    Historical Provider, MD  meloxicam (MOBIC) 15 MG tablet Take 1 tablet by mouth every morning. 01/03/16   Historical Provider, MD  metFORMIN (GLUCOPHAGE) 1000 MG tablet Take 1 tablet (1,000 mg total) by mouth 2 (two) times daily with a meal. 12/09/13   Tresa Garter, MD  methocarbamol (ROBAXIN) 500 MG tablet Take 1 tablet (500 mg total) by mouth 2 (two) times daily. 09/10/15   Olivia Canter Enda Santo, PA-C  methocarbamol (ROBAXIN) 500 MG tablet Take 1 tablet (500 mg total) by mouth 2 (two) times daily. 04/19/16   Shawn C Joy, PA-C  metoprolol succinate (TOPROL-XL) 50 MG 24 hr tablet Take 50 mg by mouth daily. Take with or immediately following a meal.    Historical Provider, MD  polyethylene glycol (MIRALAX / GLYCOLAX) packet Take drink 2 glasses daily until large bowel movement in drink one glass daily Patient not taking: Reported on 09/10/2015 09/14/13   Leonard Schwartz, MD  predniSONE (DELTASONE) 20 MG tablet Take 2 tablets (40 mg total) by mouth daily. 01/28/16   Blanchie Dessert, MD    Family History Family History  Problem Relation Age of Onset  . Diabetes Paternal Grandmother   . Heart disease Paternal Aunt   . Clotting disorder Mother   . Lymphoma Mother   . Stomach cancer Maternal Aunt   . Colon cancer Maternal Aunt   . Prostate cancer Maternal Uncle   . Heart disease Paternal Grandfather   . Coronary artery disease Paternal Grandfather   . Lung cancer Maternal Uncle      Social History Social History  Substance Use Topics  . Smoking status: Former Smoker    Years: 15.00    Types: Cigarettes    Quit date: 05/12/2015  . Smokeless tobacco: Never Used  . Alcohol use No     Allergies   Erythromycin; Keflex [cephalexin]; and Doxycycline hyclate   Review of Systems Review of Systems  Constitutional: Negative.   HENT: Negative.   Respiratory: Negative.   Cardiovascular: Positive for palpitations.  Gastrointestinal: Positive for abdominal pain and constipation.  Musculoskeletal: Negative.   Skin: Negative.   Allergic/Immunologic: Positive for immunocompromised state.       Diabetic  Neurological: Negative.   Psychiatric/Behavioral: Negative.   All other systems reviewed and are negative.    Physical Exam Updated Vital Signs BP (!) 144/87 (BP Location: Left Arm)  Pulse (!) 176   Temp 98.3 F (36.8 C) (Oral)   Resp 18   Ht 5\' 10"  (1.778 m)   Wt 270 lb (122.5 kg)   SpO2 100%   BMI 38.74 kg/m   Physical Exam  Constitutional: She appears well-developed and well-nourished.  HENT:  Head: Normocephalic and atraumatic.  Eyes: Conjunctivae are normal. Pupils are equal, round, and reactive to light.  Neck: Neck supple. No tracheal deviation present. No thyromegaly present.  Cardiovascular: Regular rhythm.   No murmur heard. Tachycardic  Pulmonary/Chest: Effort normal and breath sounds normal.  Abdominal: Soft. Bowel sounds are normal. She exhibits no distension. There is no tenderness.  Morbidly obese, in the umbilical surgical scar. Golf ball sized firm mass to at left of umbilicus which is nontender not red or warm  Musculoskeletal: Normal range of motion. She exhibits no edema or tenderness.  Neurological: She is alert. Coordination normal.  Skin: Skin is warm and dry. No rash noted.  Psychiatric: She has a normal mood and affect.  Nursing note and vitals reviewed.    ED Treatments / Results  Labs (all labs ordered are listed,  but only abnormal results are displayed) Labs Reviewed  LIPASE, BLOOD  COMPREHENSIVE METABOLIC PANEL  CBC  URINALYSIS, ROUTINE W REFLEX MICROSCOPIC    EKG  EKG Interpretation  Date/Time:  Tuesday September 25 2016 18:25:46 EDT Ventricular Rate:  179 PR Interval:    QRS Duration: 88 QT Interval:  266 QTC Calculation: 459 R Axis:   99 Text Interpretation:  Supraventricular tachycardia Rightward axis Low voltage QRS Nonspecific ST abnormality Abnormal ECG SINCE LAST TRACING HEART RATE HAS INCREASED Confirmed by Winfred Leeds  MD, Soul Deveney [phone]) on 09/25/2016 6:44:01 PM       Radiology US Abdomen Complete  Result Date: 09/24/2016 CLINICAL DATA:  56 year old female with 2 palpable areas anterior abdomen. Prior cholecystectomy. Initial encounter. EXAM: ABDOMEN ULTRASOUND COMPLETE COMPARISON:  09/14/2013 CT. FINDINGS: Gallbladder: Post cholecystectomy. Common bile duct: Diameter: 2.7 mm Liver: Increased echogenicity consistent with fatty infiltration. IVC: Evaluation limited by habitus and bowel gas. Pancreas: Evaluation limited by habitus and bowel gas. Spleen: Size and appearance within normal limits. Right Kidney: Length: 10.3 cm. No hydronephrosis. Evaluation limited by habitus and bowel gas. Left Kidney: Length: 10.3 cm. No hydronephrosis. 1 centimeter nonshadowing echogenic structure mass, possibly an angiomyolipoma. This is not appreciated on prior CT. Abdominal aorta: Evaluation limited by habitus and bowel gas. Portion visualized measures up to 2.4 centimeters. Other findings: No discrete abnormality seen in 2 regions of the anterior abdominal wall where patient has palpable abnormality. IMPRESSION: Evaluation limited by habitus and bowel gas. Post cholecystectomy Fatty liver No hydronephrosis Possible 1 centimeter angiomyolipoma mid aspect left kidney No discrete abnormality seen in 2 regions of the anterior abdominal wall where patient has palpable abnormality. Patient may be better suited for CT  imaging. Electronically Signed   By: Genia Del M.D.   On: 09/24/2016 15:52    Procedures Procedures (including critical care time)  Medications Ordered in ED Medications - No data to display Repeat EKG at 20 1:59 PM ED ECG REPORT   Date: 09/25/2016  Rate: 80  Rhythm: normal sinus rhythm  QRS Axis: normal  Intervals: normal  ST/T Wave abnormalities: normal  Conduction Disutrbances:none  Narrative Interpretation:   Old EKG Reviewed: no change from 04/25/15  I have personally reviewed the EKG tracing and agree with machine interpretation Results for orders placed or performed during the hospital encounter of 09/25/16  Lipase, blood  Result Value Ref Range   Lipase 34 11 - 51 U/L  Comprehensive metabolic panel  Result Value Ref Range   Sodium 136 135 - 145 mmol/L   Potassium 3.9 3.5 - 5.1 mmol/L   Chloride 104 101 - 111 mmol/L   CO2 23 22 - 32 mmol/L   Glucose, Bld 188 (H) 65 - 99 mg/dL   BUN 9 6 - 20 mg/dL   Creatinine, Ser 0.72 0.44 - 1.00 mg/dL   Calcium 9.3 8.9 - 10.3 mg/dL   Total Protein 7.4 6.5 - 8.1 g/dL   Albumin 3.8 3.5 - 5.0 g/dL   AST 24 15 - 41 U/L   ALT 24 14 - 54 U/L   Alkaline Phosphatase 90 38 - 126 U/L   Total Bilirubin 0.4 0.3 - 1.2 mg/dL   GFR calc non Af Amer >60 >60 mL/min   GFR calc Af Amer >60 >60 mL/min   Anion gap 9 5 - 15  CBC  Result Value Ref Range   WBC 15.3 (H) 4.0 - 10.5 K/uL   RBC 5.73 (H) 3.87 - 5.11 MIL/uL   Hemoglobin 13.9 12.0 - 15.0 g/dL   HCT 42.1 36.0 - 46.0 %   MCV 73.5 (L) 78.0 - 100.0 fL   MCH 24.3 (L) 26.0 - 34.0 pg   MCHC 33.0 30.0 - 36.0 g/dL   RDW 15.6 (H) 11.5 - 15.5 %   Platelets 234 150 - 400 K/uL  Urinalysis, Routine w reflex microscopic  Result Value Ref Range   Color, Urine YELLOW YELLOW   APPearance CLEAR CLEAR   Specific Gravity, Urine 1.017 1.005 - 1.030   pH 5.0 5.0 - 8.0   Glucose, UA NEGATIVE NEGATIVE mg/dL   Hgb urine dipstick NEGATIVE NEGATIVE   Bilirubin Urine NEGATIVE NEGATIVE   Ketones, ur  NEGATIVE NEGATIVE mg/dL   Protein, ur 30 (A) NEGATIVE mg/dL   Nitrite NEGATIVE NEGATIVE   Leukocytes, UA TRACE (A) NEGATIVE   RBC / HPF 0-5 0 - 5 RBC/hpf   WBC, UA 0-5 0 - 5 WBC/hpf   Bacteria, UA RARE (A) NONE SEEN   Squamous Epithelial / LPF 0-5 (A) NONE SEEN   Mucous PRESENT    US Abdomen Complete  Result Date: 09/24/2016 CLINICAL DATA:  56 year old female with 2 palpable areas anterior abdomen. Prior cholecystectomy. Initial encounter. EXAM: ABDOMEN ULTRASOUND COMPLETE COMPARISON:  09/14/2013 CT. FINDINGS: Gallbladder: Post cholecystectomy. Common bile duct: Diameter: 2.7 mm Liver: Increased echogenicity consistent with fatty infiltration. IVC: Evaluation limited by habitus and bowel gas. Pancreas: Evaluation limited by habitus and bowel gas. Spleen: Size and appearance within normal limits. Right Kidney: Length: 10.3 cm. No hydronephrosis. Evaluation limited by habitus and bowel gas. Left Kidney: Length: 10.3 cm. No hydronephrosis. 1 centimeter nonshadowing echogenic structure mass, possibly an angiomyolipoma. This is not appreciated on prior CT. Abdominal aorta: Evaluation limited by habitus and bowel gas. Portion visualized measures up to 2.4 centimeters. Other findings: No discrete abnormality seen in 2 regions of the anterior abdominal wall where patient has palpable abnormality. IMPRESSION: Evaluation limited by habitus and bowel gas. Post cholecystectomy Fatty liver No hydronephrosis Possible 1 centimeter angiomyolipoma mid aspect left kidney No discrete abnormality seen in 2 regions of the anterior abdominal wall where patient has palpable abnormality. Patient may be better suited for CT imaging. Electronically Signed   By: Genia Del M.D.   On: 09/24/2016 15:52   Ct Abdomen Pelvis W Contrast  Result Date: 09/25/2016 CLINICAL DATA:  Acute onset of  stomach cramping and pain. Constipation. Initial encounter. EXAM: CT ABDOMEN AND PELVIS WITH CONTRAST TECHNIQUE: Multidetector CT imaging of  the abdomen and pelvis was performed using the standard protocol following bolus administration of intravenous contrast. CONTRAST:  100 mL ISOVUE-300 IOPAMIDOL (ISOVUE-300) INJECTION 61% COMPARISON:  CT of the abdomen and pelvis performed 09/14/2013, an abdominal ultrasound performed 09/24/2016 FINDINGS: Lower chest: The visualized lung bases are grossly clear. The visualized portions of the mediastinum are unremarkable. Hepatobiliary: The liver is unremarkable in appearance. The patient is status post cholecystectomy, with clips noted at the gallbladder fossa. The common bile duct remains normal in caliber. Pancreas: The pancreas is within normal limits. Spleen: The spleen is unremarkable in appearance. Adrenals/Urinary Tract: The adrenal glands are unremarkable in appearance. The kidneys are within normal limits. There is no evidence of hydronephrosis. No renal or ureteral stones are identified. No perinephric stranding is seen. Stomach/Bowel: The stomach is unremarkable in appearance. The small bowel is within normal limits. The appendix is normal in caliber, without evidence of appendicitis. The colon is unremarkable in appearance. Vascular/Lymphatic: The abdominal aorta is unremarkable in appearance. The inferior vena cava is grossly unremarkable. No retroperitoneal lymphadenopathy is seen. No pelvic sidewall lymphadenopathy is identified. Reproductive: The bladder is mildly distended and within normal limits. The uterus is grossly unremarkable in appearance. The ovaries are relatively symmetric. No suspicious adnexal masses are seen. Other: No additional soft tissue abnormalities are seen. Musculoskeletal: No acute osseous abnormalities are identified. The visualized musculature is unremarkable in appearance. IMPRESSION: Unremarkable contrast-enhanced CT of the abdomen and pelvis. Electronically Signed   By: Garald Balding M.D.   On: 09/25/2016 20:46   Initial Impression / Assessment and Plan / ED Course  I  have reviewed the triage vital signs and the nursing notes.  Pertinent labs & imaging results that were available during my care of the patient were reviewed by me and considered in my medical decision making (see chart for details).     Administered adenosine 12 Mill grams IV adenosine with immediate conversion of rhythm to sinus tachycardia at approximately 1 18 bpm 9:40 PM patient resting comfortably complains of mild vague abdominal discomfort which feels like constipation. Resting and in normal sinus rhythm presently feels ready to go home Abdominal pain is felt to be nonspecific Final Clinical Impressions(s) / ED Diagnoses  Diagnosis #1 supraventricular tachycardia #2 generalized abdominal pain #3 hyperglycemia Final diagnoses:  None    New Prescriptions New Prescriptions   No medications on file     Orlie Dakin, MD 09/25/16 2234    Orlie Dakin, MD 09/25/16 2235

## 2016-09-25 NOTE — Discharge Instructions (Signed)
Call Dr. Emilee Hero bonsu tomorrow to schedule a follow-up appointment. Tell him that he had a CT scan performed here today, which showed no abnormality. Also tell him about your fast heartbeat today. If your fast heartbeat returns you may need to have your medications adjusted or revisit your cardiologist. Your blood sugar today was mildly elevated at 188

## 2016-09-25 NOTE — ED Triage Notes (Signed)
Per Pt, Pt is coming from [place] where she was sent by PCP for constipation. Pt reports generalized abdominal discomfort for three days. Pt reports feeling like her heart is racing since the [place]. Alert and Oriented x4.

## 2017-02-26 ENCOUNTER — Other Ambulatory Visit: Payer: Self-pay | Admitting: Orthopedic Surgery

## 2017-02-26 DIAGNOSIS — M5416 Radiculopathy, lumbar region: Secondary | ICD-10-CM

## 2017-03-06 ENCOUNTER — Ambulatory Visit
Admission: RE | Admit: 2017-03-06 | Discharge: 2017-03-06 | Disposition: A | Discharge status: Home / Self Care | Payer: BLUE CROSS/BLUE SHIELD | Source: Ambulatory Visit | Attending: Orthopedic Surgery | Admitting: Orthopedic Surgery

## 2017-03-06 ENCOUNTER — Other Ambulatory Visit: Payer: Self-pay | Admitting: Orthopedic Surgery

## 2017-03-06 DIAGNOSIS — M5416 Radiculopathy, lumbar region: Secondary | ICD-10-CM

## 2017-03-08 ENCOUNTER — Encounter (INDEPENDENT_AMBULATORY_CARE_PROVIDER_SITE_OTHER): Payer: Self-pay

## 2017-03-08 ENCOUNTER — Ambulatory Visit (INDEPENDENT_AMBULATORY_CARE_PROVIDER_SITE_OTHER): Payer: BLUE CROSS/BLUE SHIELD | Admitting: Neurology

## 2017-03-08 DIAGNOSIS — Z0289 Encounter for other administrative examinations: Secondary | ICD-10-CM

## 2017-03-08 DIAGNOSIS — M545 Low back pain, unspecified: Secondary | ICD-10-CM | POA: Insufficient documentation

## 2017-03-08 NOTE — Procedures (Signed)
Full Name: Hanako Tipping Gender: Female MRN #: [id number] Date of Birth: [birth year]    Visit Date: 03/08/17 12:27 Age: 56 Years 0 Months Old Examining Physician: Marcial Pacas, MD  Referring Physician: Phylliss Bob, MD History: 56 year old female with history of chronic low back pain radiating pain to bilateral lower extremity  Summary of the test:  Nerve conduction study: Bilateral sural, peroneal sensory responses were normal. Bilateral tibial, peroneal to EDB motor responses were normal.  Electromyography: Selected needle examination of bilateral lower extremity muscles and bilateral lumbar sacral paraspinal muscles were normal.   Conclusion: This is a normal study. There is no electrodiagnostic evidence of large fiber peripheral neuropathy, or bilateral lumbar sacral radiculopathy.    ------------------------------- Marcial Pacas, M.D.  [facility] [place], [place] [address] Tel: [phone] Fax: [phone]        [facility]    Nerve / Sites Muscle Latency Ref. Amplitude Ref. Rel Amp Segments Distance Velocity Ref. Area    ms ms mV mV %  cm m/s m/s mVms  R Peroneal - EDB     Ankle EDB 4.8 ?6.5 4.2 ?2.0 100 Ankle - EDB 9   11.6     Fib head EDB 11.3  3.8  90.8 Fib head - Ankle 29 45 ?44 11.4     Pop fossa EDB 13.5  3.7  96.2 Pop fossa - Fib head 10 45 ?44 11.2         Pop fossa - Ankle      L Peroneal - EDB     Ankle EDB 5.4 ?6.5 3.5 ?2.0 100 Ankle - EDB 9   12.2     Fib head EDB 12.2  3.6  103 Fib head - Ankle 30 44 ?44 13.2     Pop fossa EDB 14.9  3.0  83.8 Pop fossa - Fib head 12 45 ?44 10.5         Pop fossa - Ankle      R Tibial - AH     Ankle AH 4.7 ?5.8 5.6 ?4.0 100 Ankle - AH 9   10.4     Pop fossa AH 14.1  4.2  74.7 Pop fossa - Ankle 38 41 ?41 9.8  L Tibial - AH     Ankle AH 4.4 ?5.8 6.2 ?4.0 100 Ankle - AH 9   19.1     Pop fossa AH 13.6  5.1  82.1 Pop fossa - Ankle 39 42 ?41 19.9             SNC    Nerve / Sites  Rec. Site Peak Lat Ref.  Amp Ref. Segments Distance    ms ms V V  cm  R Sural - Ankle (Calf)     Calf Ankle 4.4 ?4.4 6 ?6 Calf - Ankle 14  L Sural - Ankle (Calf)     Calf Ankle 4.0 ?4.4 6 ?6 Calf - Ankle 14  R Superficial peroneal - Ankle     Lat leg Ankle 4.1 ?4.4 6 ?6 Lat leg - Ankle 14  L Superficial peroneal - Ankle     Lat leg Ankle 4.3 ?4.4 7 ?6 Lat leg - Ankle 14             F  Wave    Nerve F Lat Ref.   ms ms  R Tibial - AH 51.6 ?56.0  L Tibial - AH 52.1 ?56.0  H Reflex    Nerve H Lat Lat Hmax   ms ms   Left Right Ref. Left Right Ref.  Tibial - [place] 34.2 34.4 ?35.0 35.3 32.2 ?35.0         EMG full       EMG Summary Table    Spontaneous MUAP Recruitment  Muscle IA Fib PSW Fasc Other Amp Dur. Poly Pattern  R. Tibialis anterior Normal None None None _______ Normal Normal Normal Normal  R. Tibialis posterior Normal None None None _______ Normal Normal Normal Normal  R. Peroneus longus Normal None None None _______ Normal Normal Normal Normal  R. Vastus lateralis Normal None None None _______ Normal Normal Normal Normal  L. Tibialis anterior Normal None None None _______ Normal Normal Normal Normal  L. Tibialis posterior Normal None None None _______ Normal Normal Normal Normal  L. Vastus lateralis Normal None None None _______ Normal Normal Normal Normal  L. Peroneus longus Normal None None None _______ Normal Normal Normal Normal  L. Lumbar paraspinals Normal None None None _______ Normal Normal Normal Normal  R. Lumbar paraspinals (mid) Normal None None None _______ Normal Normal Normal Normal  R. Lumbar paraspinals (low) Normal None None None _______ Normal Normal Normal Normal

## 2017-09-16 DIAGNOSIS — Z0271 Encounter for disability determination: Secondary | ICD-10-CM

## 2017-11-12 ENCOUNTER — Other Ambulatory Visit: Payer: Self-pay | Admitting: Physician Assistant

## 2017-11-12 DIAGNOSIS — M545 Low back pain, unspecified: Secondary | ICD-10-CM

## 2017-11-12 DIAGNOSIS — M546 Pain in thoracic spine: Secondary | ICD-10-CM

## 2017-12-01 ENCOUNTER — Other Ambulatory Visit: Payer: BLUE CROSS/BLUE SHIELD

## 2017-12-03 IMAGING — CR DG LUMBAR SPINE COMPLETE 4+V
5 series · 5 of 5 positions shown · non-contrast
Comparison: None.

CLINICAL DATA: Fall

EXAM:
LUMBAR SPINE - COMPLETE 4+ VIEW

[t l-spine a.p.]
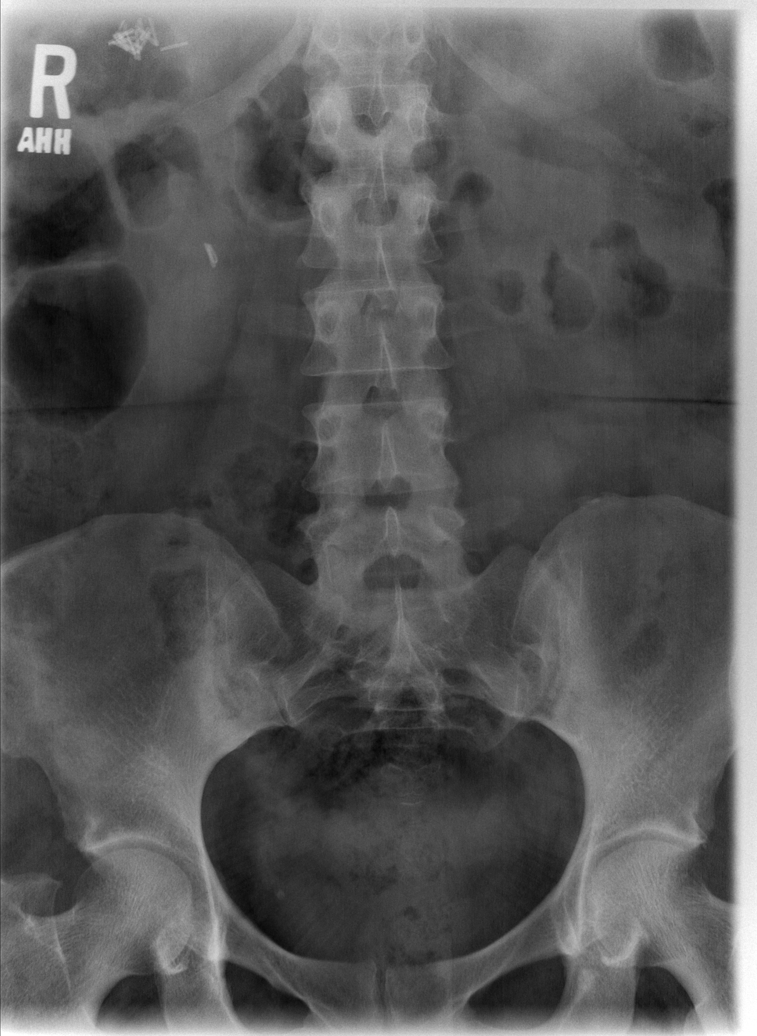

[t l-spine oblique exposure (1 of 2)]
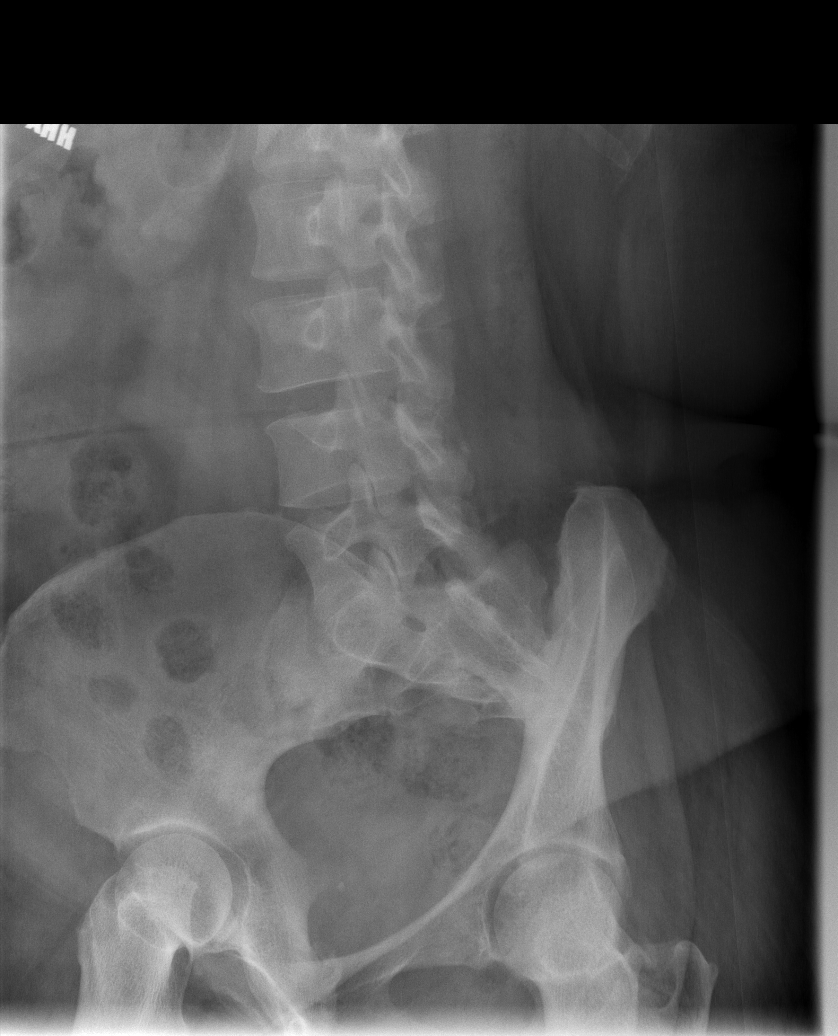

[t l-spine oblique exposure (2 of 2)]
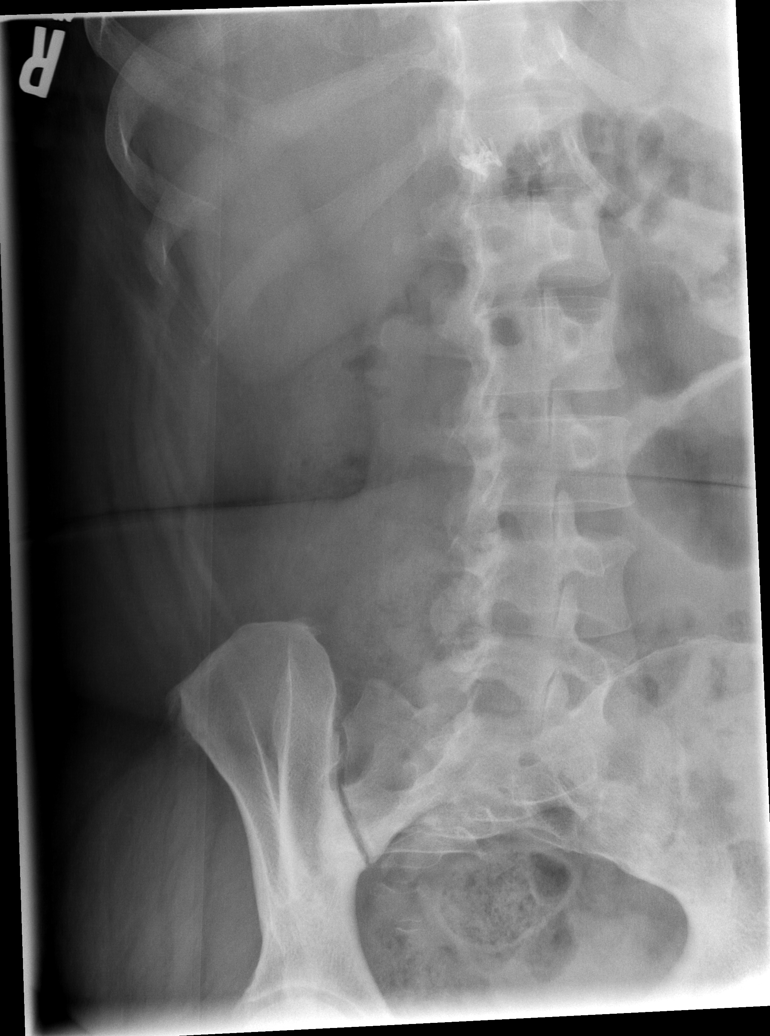

[t l-spine lat]
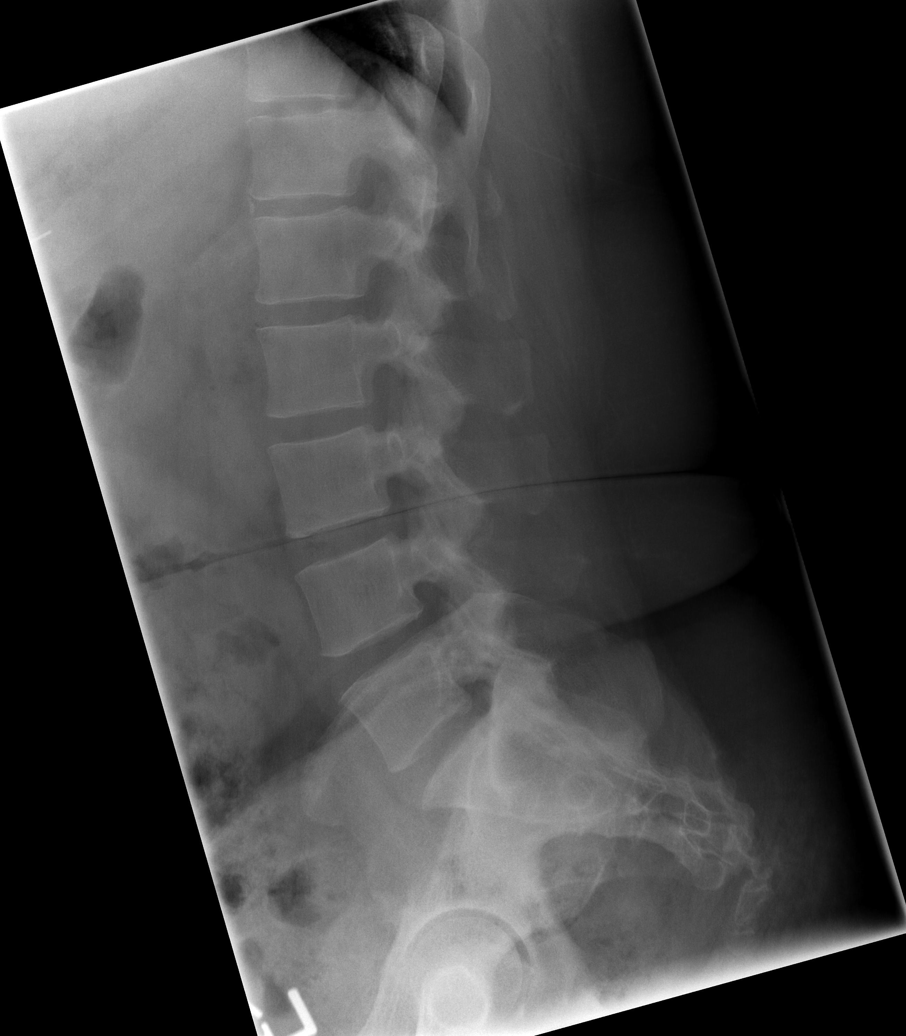

[t l-spine l5-s1 spot]
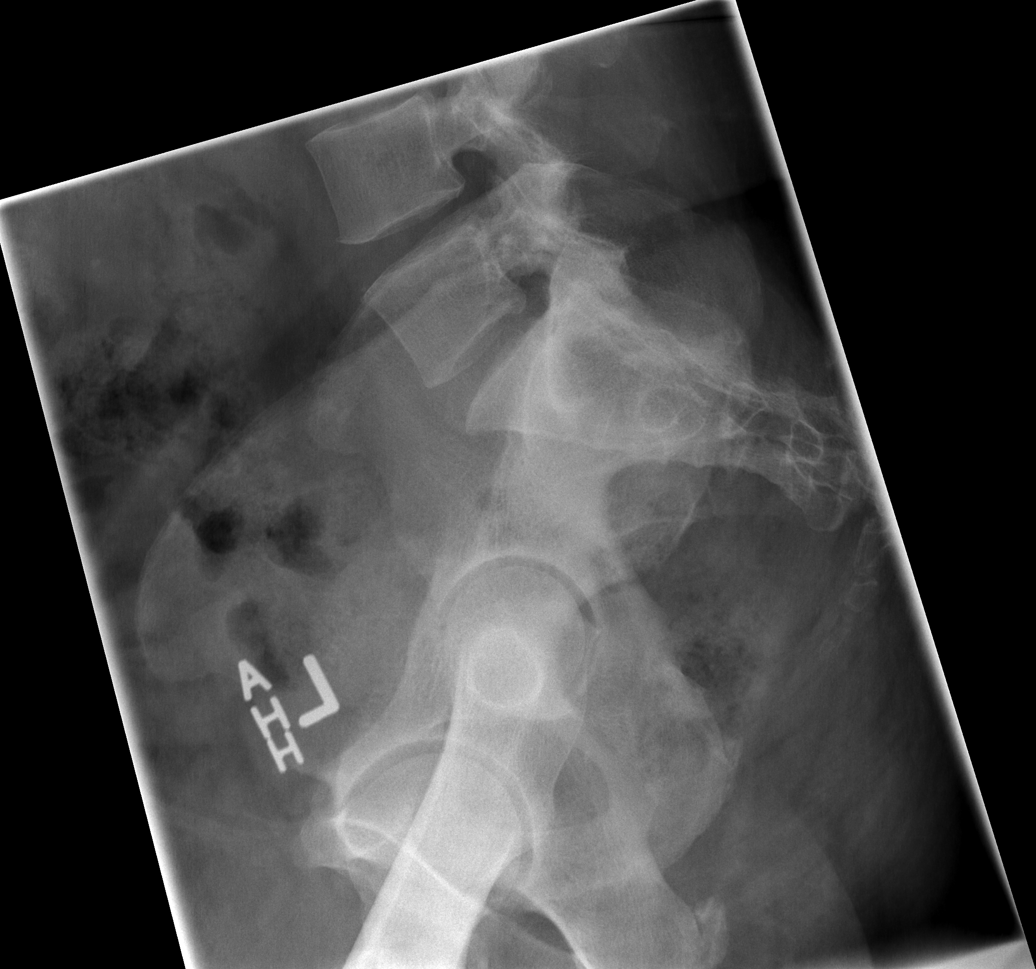

[5 of 5 positions shown; findings below may reference images not displayed]

FINDINGS: There is anatomic alignment of the vertebral bodies. Mild narrowing
of the L4-5 disc. No vertebral compression deformity. There is mild
degenerative change of the SI joints.
IMPRESSION: No acute bony pathology.  Degenerative change.

## 2017-12-03 IMAGING — CR DG SACRUM/COCCYX 2+V
3 series · 3 of 3 positions shown · non-contrast
Comparison: None.

CLINICAL DATA: Fall

EXAM:
SACRUM AND COCCYX - 2+ VIEW

[t sacrum lat (1 of 2)]
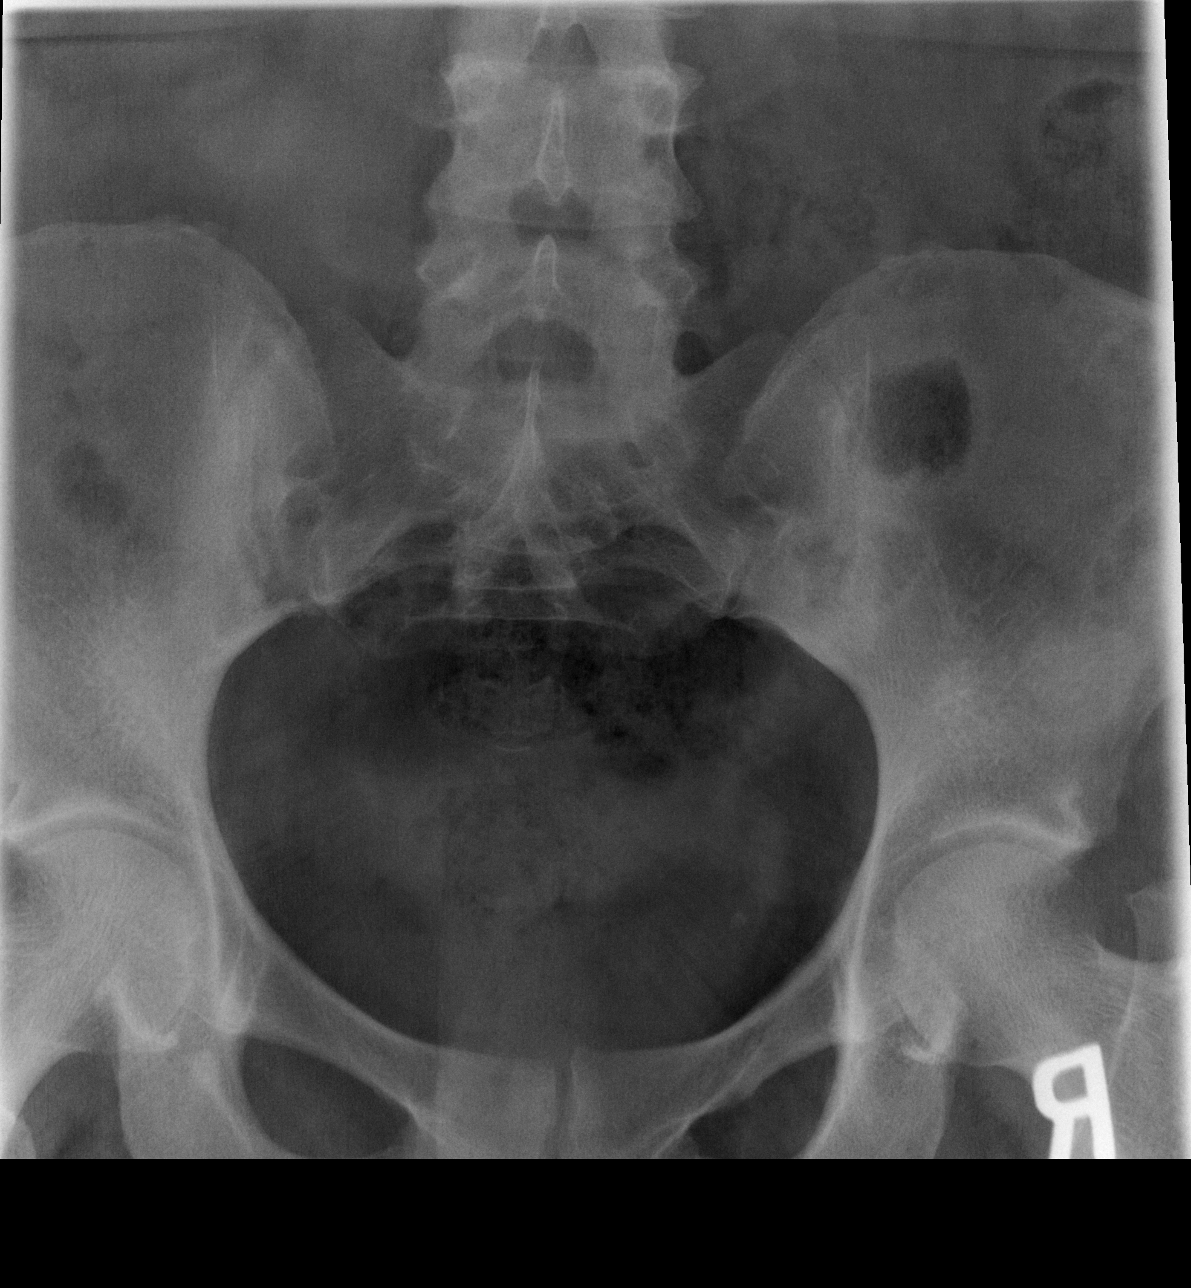

[t sacrum lat (2 of 2)]
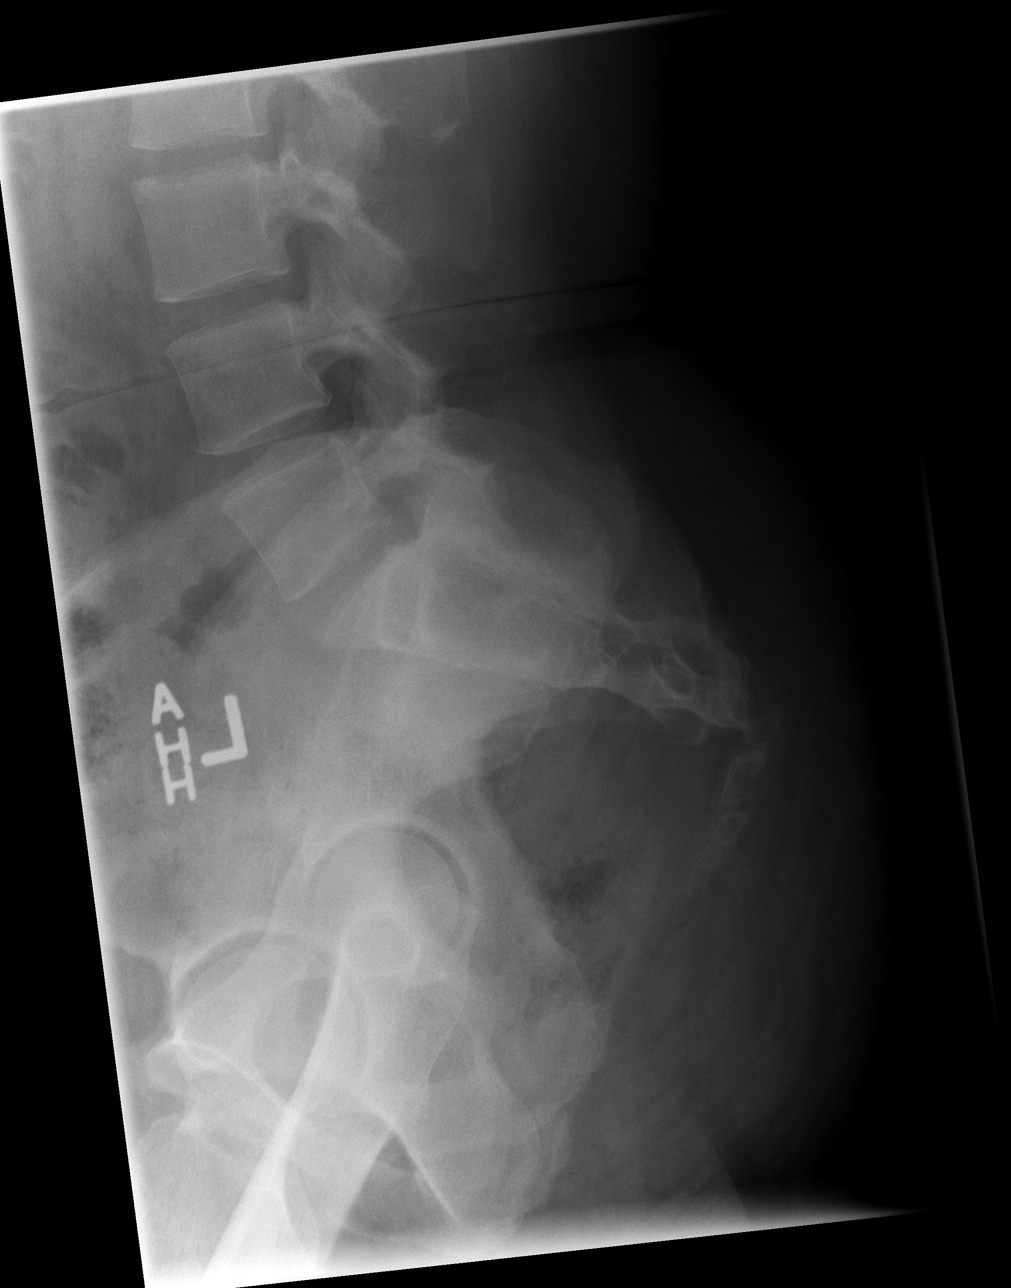

[t sacrum a.p.]
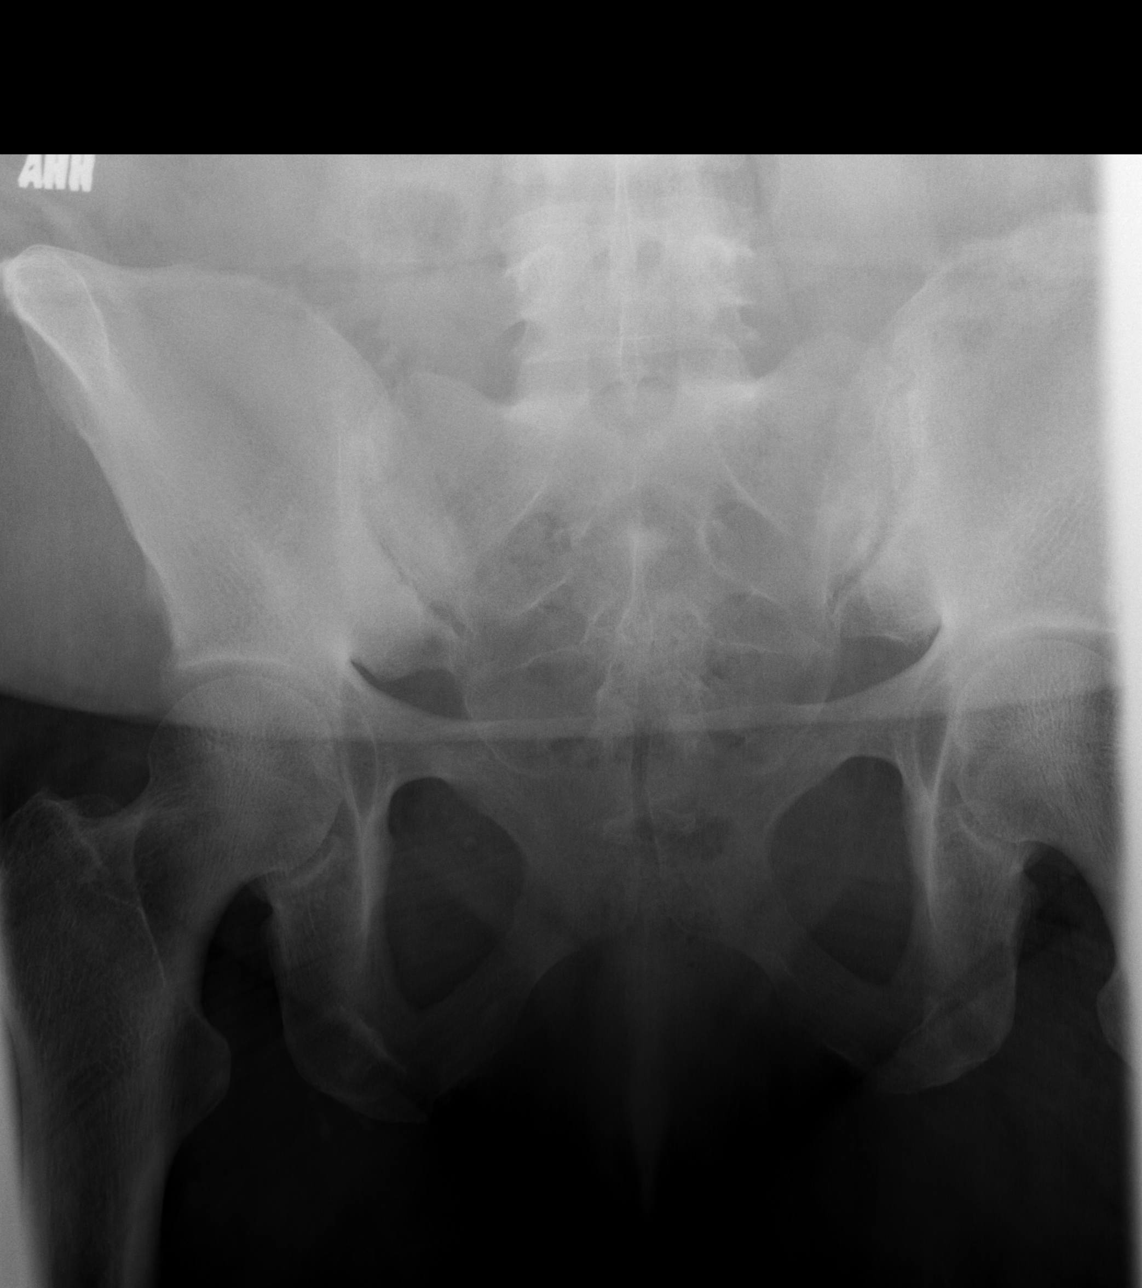

[3 of 3 positions shown; findings below may reference images not displayed]

FINDINGS: No acute fracture. No dislocation. Unremarkable soft tissues. Mild
degenerative change of the SI joints.
IMPRESSION: No acute bony pathology.

## 2017-12-03 IMAGING — CR DG KNEE COMPLETE 4+V*R*
4 series · 4 of 4 positions shown · non-contrast
Comparison: 07/28/2012

CLINICAL DATA: Fell yesterday at [employer] in restroom, knee pain

EXAM:
RIGHT KNEE - COMPLETE 4+ VIEW

[t knee ap right]
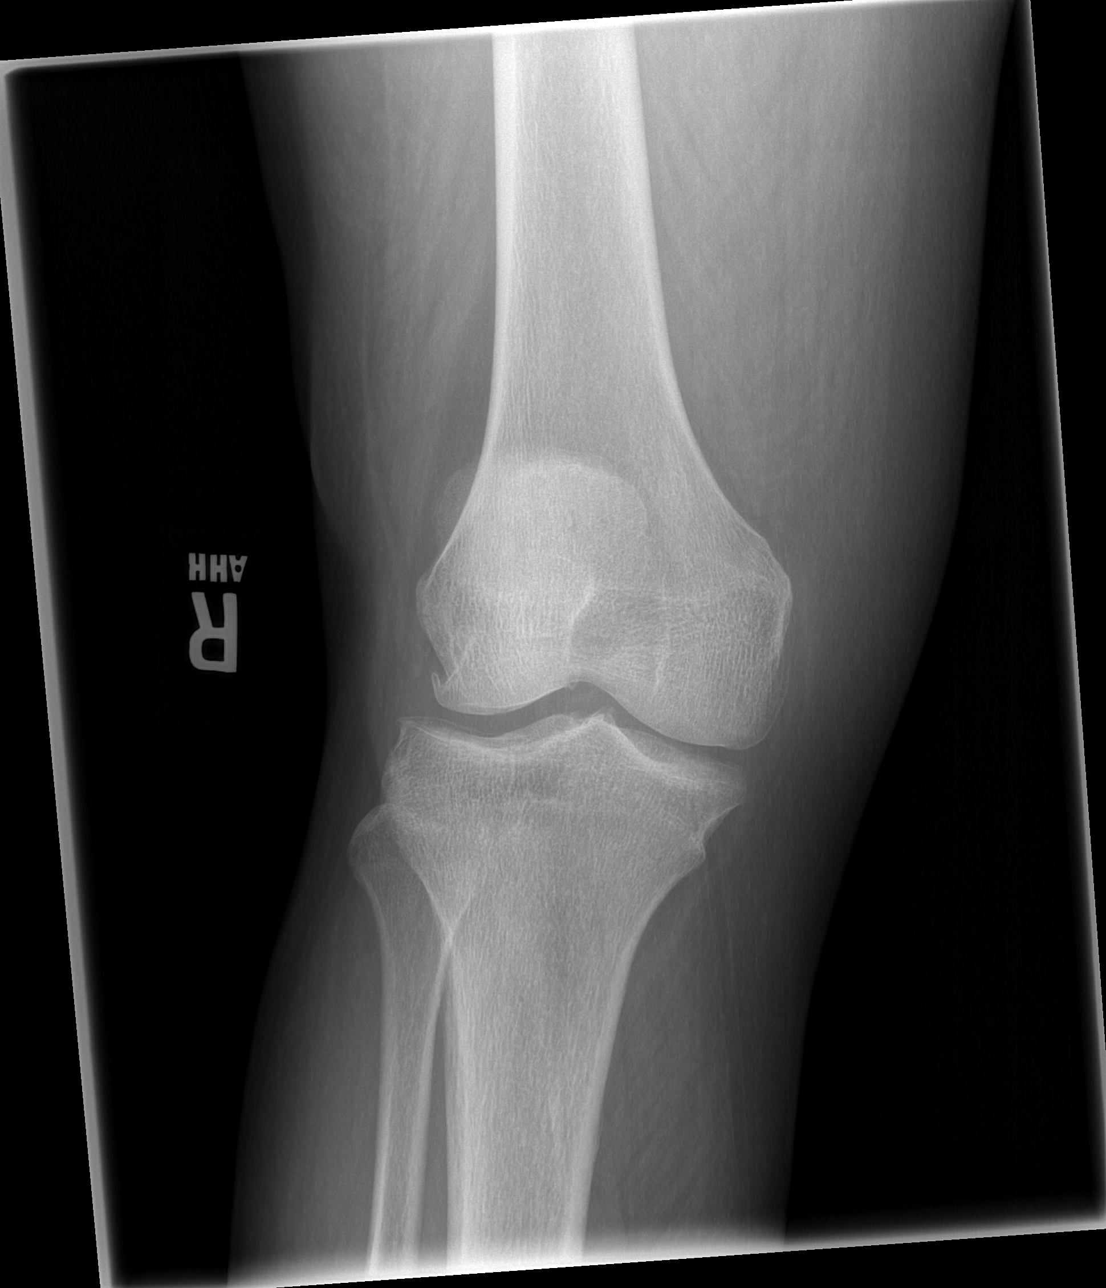

[t knee oblique right (1 of 2)]
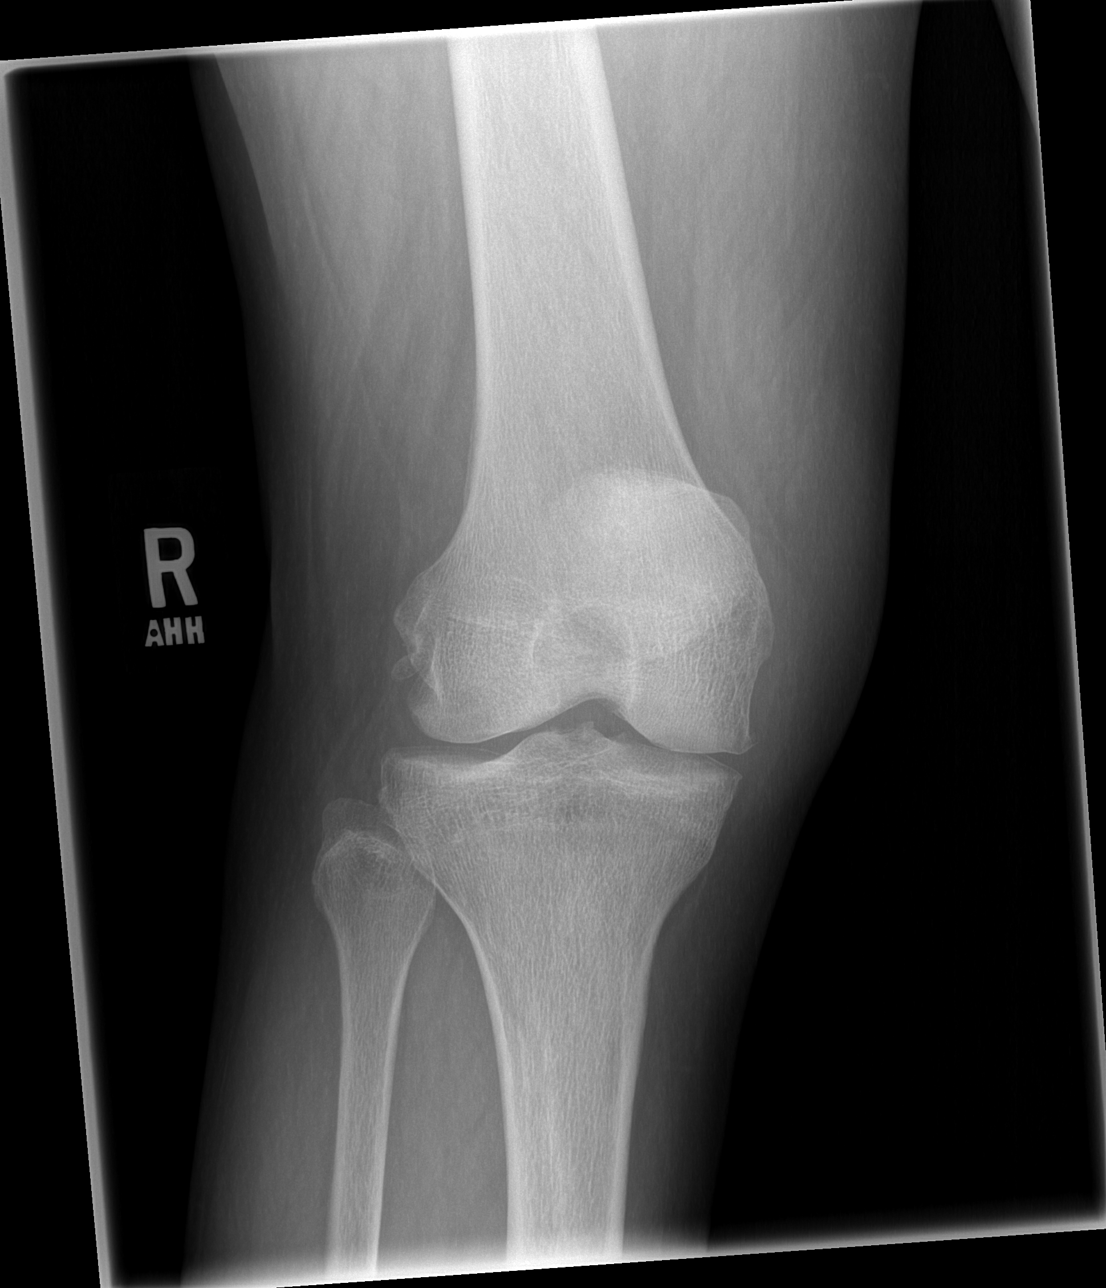

[t knee oblique right (2 of 2)]
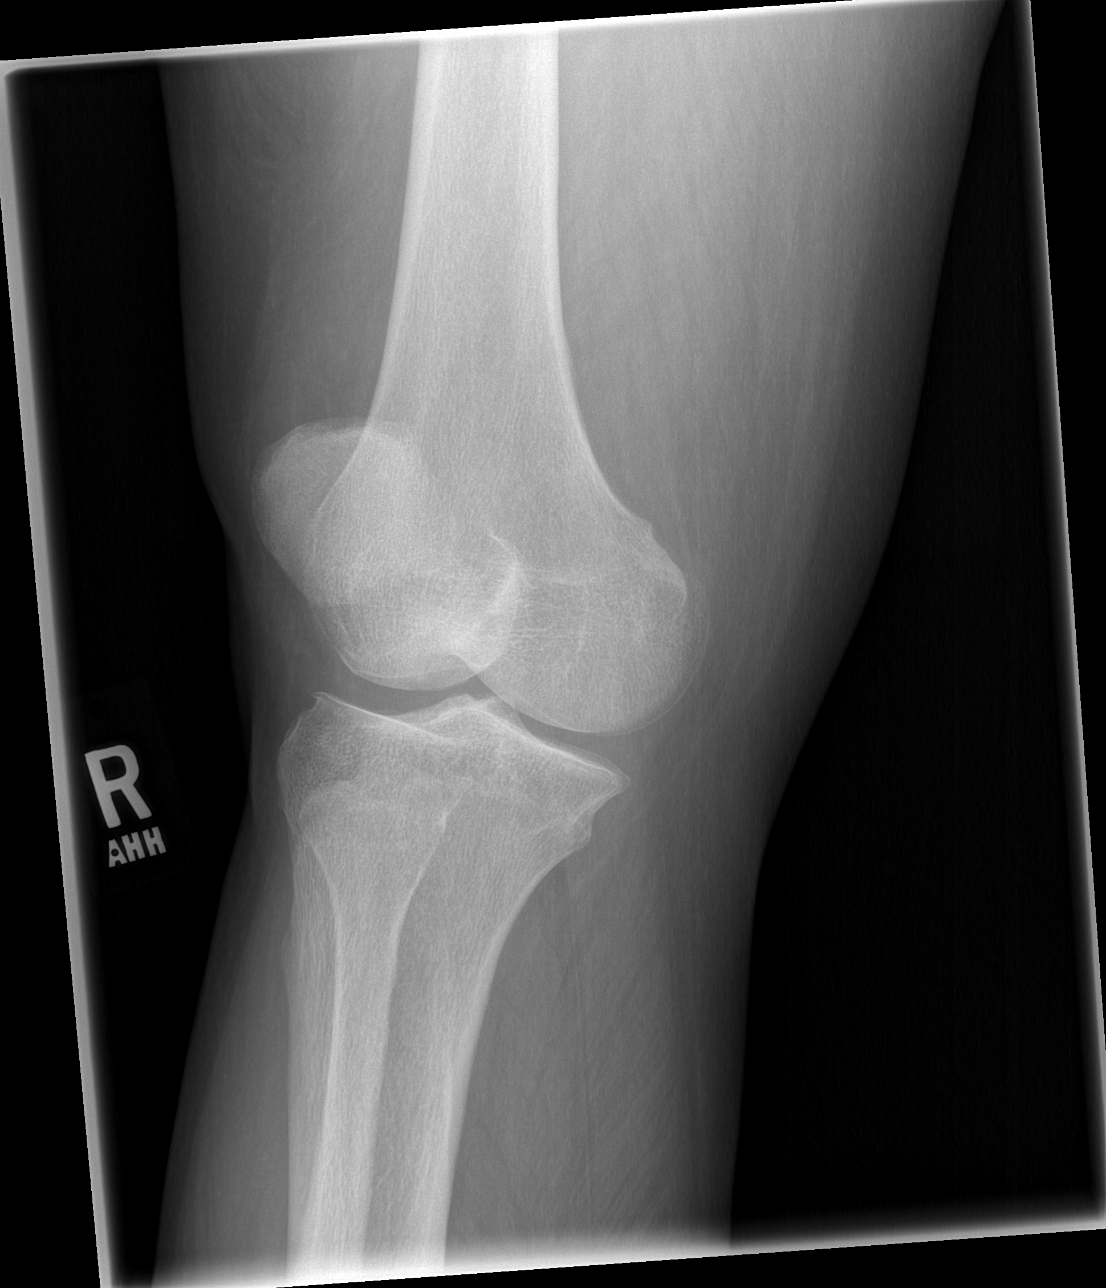

[t knee lat right]
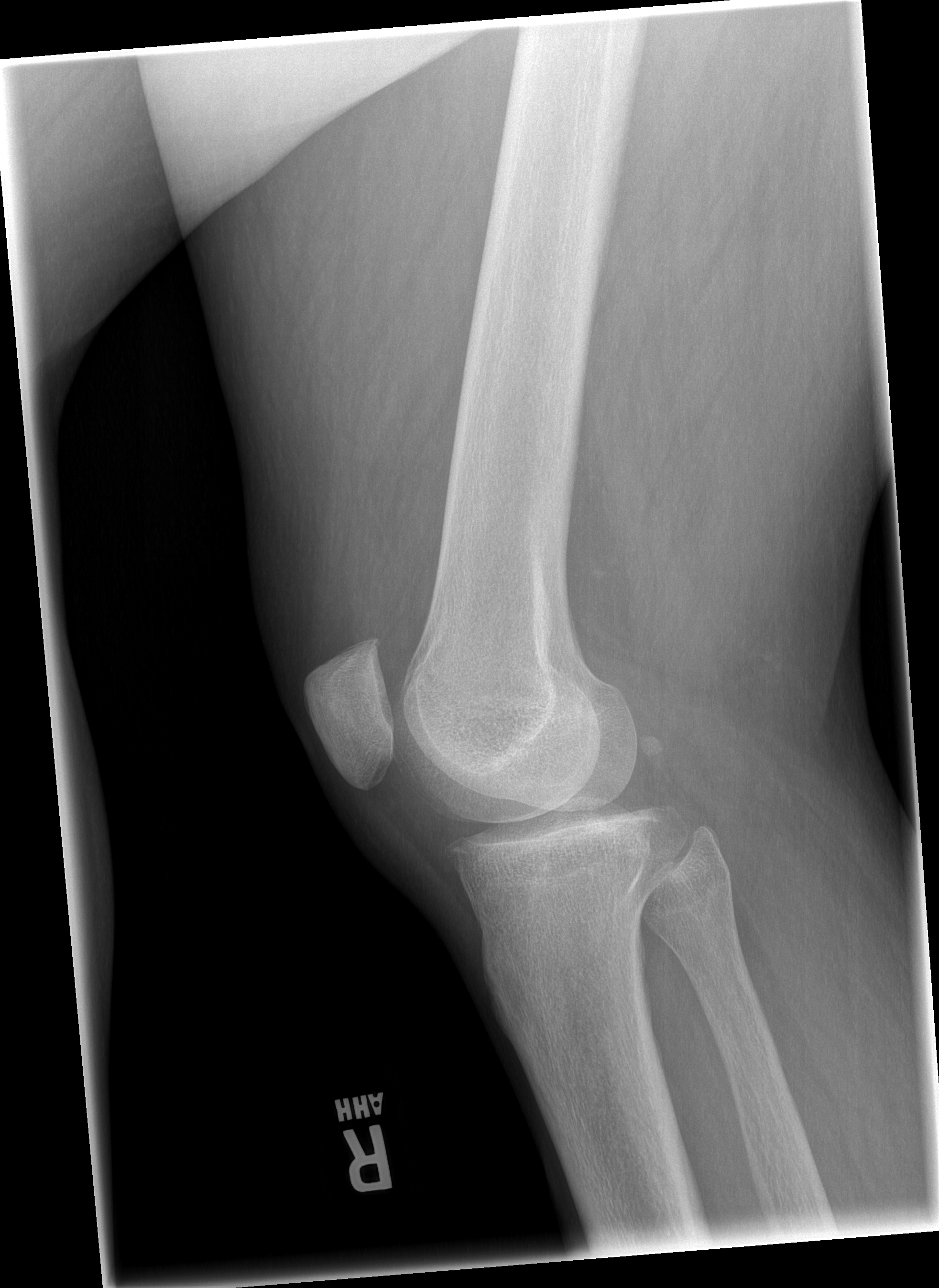

[4 of 4 positions shown; findings below may reference images not displayed]

FINDINGS: Osseous mineralization normal for technique.

Minimal spur formation and joint space narrowing.

No acute fracture, dislocation or bone destruction.

No knee joint effusion.
IMPRESSION: Minimal degenerative changes.

No acute abnormalities

## 2017-12-04 ENCOUNTER — Other Ambulatory Visit: Payer: BLUE CROSS/BLUE SHIELD

## 2017-12-05 ENCOUNTER — Other Ambulatory Visit: Payer: Self-pay | Admitting: Sports Medicine

## 2017-12-05 DIAGNOSIS — M25559 Pain in unspecified hip: Secondary | ICD-10-CM

## 2017-12-05 DIAGNOSIS — M545 Low back pain, unspecified: Secondary | ICD-10-CM

## 2017-12-05 DIAGNOSIS — R102 Pelvic and perineal pain: Secondary | ICD-10-CM

## 2017-12-07 ENCOUNTER — Ambulatory Visit
Admission: RE | Admit: 2017-12-07 | Discharge: 2017-12-07 | Disposition: A | Discharge status: Home / Self Care | Payer: BLUE CROSS/BLUE SHIELD | Source: Ambulatory Visit | Attending: Sports Medicine | Admitting: Sports Medicine

## 2017-12-07 DIAGNOSIS — M545 Low back pain, unspecified: Secondary | ICD-10-CM

## 2017-12-10 ENCOUNTER — Other Ambulatory Visit: Payer: BLUE CROSS/BLUE SHIELD

## 2017-12-13 ENCOUNTER — Other Ambulatory Visit: Payer: BLUE CROSS/BLUE SHIELD

## 2017-12-17 ENCOUNTER — Other Ambulatory Visit: Payer: Self-pay | Admitting: Sports Medicine

## 2017-12-17 DIAGNOSIS — R102 Pelvic and perineal pain: Secondary | ICD-10-CM

## 2017-12-17 DIAGNOSIS — M25552 Pain in left hip: Secondary | ICD-10-CM

## 2017-12-17 DIAGNOSIS — M25559 Pain in unspecified hip: Secondary | ICD-10-CM

## 2017-12-17 DIAGNOSIS — M545 Low back pain, unspecified: Secondary | ICD-10-CM

## 2017-12-17 DIAGNOSIS — M25551 Pain in right hip: Secondary | ICD-10-CM

## 2017-12-24 ENCOUNTER — Ambulatory Visit
Admission: RE | Admit: 2017-12-24 | Discharge: 2017-12-24 | Disposition: A | Discharge status: Home / Self Care | Payer: BLUE CROSS/BLUE SHIELD | Source: Ambulatory Visit | Attending: Sports Medicine | Admitting: Sports Medicine

## 2017-12-24 DIAGNOSIS — M545 Low back pain, unspecified: Secondary | ICD-10-CM

## 2017-12-24 DIAGNOSIS — R102 Pelvic and perineal pain: Secondary | ICD-10-CM

## 2017-12-24 DIAGNOSIS — M25552 Pain in left hip: Secondary | ICD-10-CM

## 2017-12-24 DIAGNOSIS — M25551 Pain in right hip: Secondary | ICD-10-CM

## 2017-12-24 DIAGNOSIS — M25559 Pain in unspecified hip: Secondary | ICD-10-CM

## 2017-12-26 ENCOUNTER — Ambulatory Visit
Admission: RE | Admit: 2017-12-26 | Discharge: 2017-12-26 | Disposition: A | Discharge status: Home / Self Care | Payer: BLUE CROSS/BLUE SHIELD | Source: Ambulatory Visit | Attending: Sports Medicine | Admitting: Sports Medicine

## 2017-12-26 DIAGNOSIS — M545 Low back pain, unspecified: Secondary | ICD-10-CM

## 2017-12-26 DIAGNOSIS — M25552 Pain in left hip: Secondary | ICD-10-CM

## 2017-12-26 DIAGNOSIS — M25559 Pain in unspecified hip: Secondary | ICD-10-CM

## 2017-12-26 DIAGNOSIS — M25551 Pain in right hip: Secondary | ICD-10-CM

## 2017-12-26 DIAGNOSIS — R102 Pelvic and perineal pain: Secondary | ICD-10-CM

## 2018-07-16 DIAGNOSIS — J310 Chronic rhinitis: Secondary | ICD-10-CM | POA: Insufficient documentation

## 2018-07-16 DIAGNOSIS — J343 Hypertrophy of nasal turbinates: Secondary | ICD-10-CM | POA: Insufficient documentation

## 2018-09-02 ENCOUNTER — Other Ambulatory Visit: Payer: Self-pay | Admitting: Physician Assistant

## 2018-09-02 DIAGNOSIS — R1310 Dysphagia, unspecified: Secondary | ICD-10-CM

## 2018-09-19 DIAGNOSIS — E119 Type 2 diabetes mellitus without complications: Secondary | ICD-10-CM | POA: Insufficient documentation

## 2018-09-29 ENCOUNTER — Other Ambulatory Visit: Payer: BLUE CROSS/BLUE SHIELD

## 2018-11-14 DIAGNOSIS — Z7409 Other reduced mobility: Secondary | ICD-10-CM | POA: Insufficient documentation

## 2018-11-14 DIAGNOSIS — Z87898 Personal history of other specified conditions: Secondary | ICD-10-CM | POA: Insufficient documentation

## 2018-11-14 DIAGNOSIS — R002 Palpitations: Secondary | ICD-10-CM | POA: Insufficient documentation

## 2018-11-14 DIAGNOSIS — I5033 Acute on chronic diastolic (congestive) heart failure: Secondary | ICD-10-CM | POA: Insufficient documentation

## 2018-11-14 DIAGNOSIS — M7989 Other specified soft tissue disorders: Secondary | ICD-10-CM | POA: Insufficient documentation

## 2018-11-19 ENCOUNTER — Other Ambulatory Visit: Payer: BLUE CROSS/BLUE SHIELD

## 2019-01-06 ENCOUNTER — Other Ambulatory Visit: Payer: Self-pay

## 2019-01-06 ENCOUNTER — Encounter (HOSPITAL_COMMUNITY): Payer: Self-pay

## 2019-01-06 DIAGNOSIS — S39012A Strain of muscle, fascia and tendon of lower back, initial encounter: Secondary | ICD-10-CM | POA: Diagnosis not present

## 2019-01-06 DIAGNOSIS — Y999 Unspecified external cause status: Secondary | ICD-10-CM | POA: Diagnosis not present

## 2019-01-06 DIAGNOSIS — G8929 Other chronic pain: Secondary | ICD-10-CM | POA: Diagnosis not present

## 2019-01-06 DIAGNOSIS — Y9289 Other specified places as the place of occurrence of the external cause: Secondary | ICD-10-CM | POA: Diagnosis not present

## 2019-01-06 DIAGNOSIS — Z7984 Long term (current) use of oral hypoglycemic drugs: Secondary | ICD-10-CM | POA: Diagnosis not present

## 2019-01-06 DIAGNOSIS — E119 Type 2 diabetes mellitus without complications: Secondary | ICD-10-CM | POA: Diagnosis not present

## 2019-01-06 DIAGNOSIS — Y9389 Activity, other specified: Secondary | ICD-10-CM | POA: Insufficient documentation

## 2019-01-06 DIAGNOSIS — G4733 Obstructive sleep apnea (adult) (pediatric): Secondary | ICD-10-CM | POA: Diagnosis not present

## 2019-01-06 DIAGNOSIS — E669 Obesity, unspecified: Secondary | ICD-10-CM | POA: Diagnosis not present

## 2019-01-06 DIAGNOSIS — S161XXA Strain of muscle, fascia and tendon at neck level, initial encounter: Secondary | ICD-10-CM | POA: Insufficient documentation

## 2019-01-06 DIAGNOSIS — Z87891 Personal history of nicotine dependence: Secondary | ICD-10-CM | POA: Diagnosis not present

## 2019-01-06 DIAGNOSIS — J449 Chronic obstructive pulmonary disease, unspecified: Secondary | ICD-10-CM | POA: Diagnosis not present

## 2019-01-06 DIAGNOSIS — Z79899 Other long term (current) drug therapy: Secondary | ICD-10-CM | POA: Insufficient documentation

## 2019-01-06 DIAGNOSIS — S199XXA Unspecified injury of neck, initial encounter: Secondary | ICD-10-CM | POA: Diagnosis present
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
## 2019-01-07 ENCOUNTER — Emergency Department (HOSPITAL_COMMUNITY)
Admission: EM | Admit: 2019-01-07 | Discharge: 2019-01-07 | Disposition: A | Discharge status: Home / Self Care | Payer: BLUE CROSS/BLUE SHIELD | Attending: Emergency Medicine | Admitting: Emergency Medicine

## 2019-01-07 DIAGNOSIS — S161XXA Strain of muscle, fascia and tendon at neck level, initial encounter: Secondary | ICD-10-CM

## 2019-01-07 DIAGNOSIS — S39012A Strain of muscle, fascia and tendon of lower back, initial encounter: Secondary | ICD-10-CM

## 2019-01-07 MED ORDER — CYCLOBENZAPRINE HCL 10 MG PO TABS: 10.0000 mg | ORAL_TABLET | Freq: Two times a day (BID) | ORAL | 0 refills | Status: DC | PRN
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
## 2019-04-16 DIAGNOSIS — M174 Other bilateral secondary osteoarthritis of knee: Secondary | ICD-10-CM | POA: Diagnosis not present

## 2019-04-16 DIAGNOSIS — I10 Essential (primary) hypertension: Secondary | ICD-10-CM | POA: Diagnosis not present

## 2019-04-16 DIAGNOSIS — E119 Type 2 diabetes mellitus without complications: Secondary | ICD-10-CM | POA: Diagnosis not present

## 2019-04-16 DIAGNOSIS — J45909 Unspecified asthma, uncomplicated: Secondary | ICD-10-CM | POA: Diagnosis not present

## 2019-04-16 DIAGNOSIS — Z0001 Encounter for general adult medical examination with abnormal findings: Secondary | ICD-10-CM | POA: Diagnosis not present

## 2019-04-16 DIAGNOSIS — G4733 Obstructive sleep apnea (adult) (pediatric): Secondary | ICD-10-CM | POA: Diagnosis not present

## 2019-04-22 DIAGNOSIS — G894 Chronic pain syndrome: Secondary | ICD-10-CM | POA: Diagnosis not present

## 2019-04-22 DIAGNOSIS — S83282A Other tear of lateral meniscus, current injury, left knee, initial encounter: Secondary | ICD-10-CM | POA: Diagnosis not present

## 2019-04-22 DIAGNOSIS — Z79891 Long term (current) use of opiate analgesic: Secondary | ICD-10-CM | POA: Diagnosis not present

## 2019-04-22 DIAGNOSIS — Z79899 Other long term (current) drug therapy: Secondary | ICD-10-CM | POA: Diagnosis not present

## 2019-04-22 DIAGNOSIS — M5136 Other intervertebral disc degeneration, lumbar region: Secondary | ICD-10-CM | POA: Diagnosis not present

## 2019-04-22 DIAGNOSIS — M170 Bilateral primary osteoarthritis of knee: Secondary | ICD-10-CM | POA: Diagnosis not present

## 2019-05-05 DIAGNOSIS — Z1231 Encounter for screening mammogram for malignant neoplasm of breast: Secondary | ICD-10-CM | POA: Diagnosis not present

## 2019-05-20 DIAGNOSIS — M5136 Other intervertebral disc degeneration, lumbar region: Secondary | ICD-10-CM | POA: Diagnosis not present

## 2019-05-20 DIAGNOSIS — Z79899 Other long term (current) drug therapy: Secondary | ICD-10-CM | POA: Diagnosis not present

## 2019-05-20 DIAGNOSIS — S83282A Other tear of lateral meniscus, current injury, left knee, initial encounter: Secondary | ICD-10-CM | POA: Diagnosis not present

## 2019-05-20 DIAGNOSIS — G894 Chronic pain syndrome: Secondary | ICD-10-CM | POA: Diagnosis not present

## 2019-05-20 DIAGNOSIS — M170 Bilateral primary osteoarthritis of knee: Secondary | ICD-10-CM | POA: Diagnosis not present

## 2019-05-20 DIAGNOSIS — Z79891 Long term (current) use of opiate analgesic: Secondary | ICD-10-CM | POA: Diagnosis not present

## 2019-05-21 DIAGNOSIS — H538 Other visual disturbances: Secondary | ICD-10-CM | POA: Diagnosis not present

## 2019-05-21 DIAGNOSIS — H2511 Age-related nuclear cataract, right eye: Secondary | ICD-10-CM | POA: Diagnosis not present

## 2019-05-21 DIAGNOSIS — E1165 Type 2 diabetes mellitus with hyperglycemia: Secondary | ICD-10-CM | POA: Diagnosis not present

## 2019-06-10 DIAGNOSIS — G894 Chronic pain syndrome: Secondary | ICD-10-CM | POA: Diagnosis not present

## 2019-06-10 DIAGNOSIS — M792 Neuralgia and neuritis, unspecified: Secondary | ICD-10-CM | POA: Diagnosis not present

## 2019-06-11 DIAGNOSIS — N950 Postmenopausal bleeding: Secondary | ICD-10-CM | POA: Diagnosis not present

## 2019-06-19 DIAGNOSIS — M170 Bilateral primary osteoarthritis of knee: Secondary | ICD-10-CM | POA: Diagnosis not present

## 2019-06-19 DIAGNOSIS — M5136 Other intervertebral disc degeneration, lumbar region: Secondary | ICD-10-CM | POA: Diagnosis not present

## 2019-06-19 DIAGNOSIS — S83282A Other tear of lateral meniscus, current injury, left knee, initial encounter: Secondary | ICD-10-CM | POA: Diagnosis not present

## 2019-06-19 DIAGNOSIS — G894 Chronic pain syndrome: Secondary | ICD-10-CM | POA: Diagnosis not present

## 2019-07-02 DIAGNOSIS — G4733 Obstructive sleep apnea (adult) (pediatric): Secondary | ICD-10-CM | POA: Diagnosis not present

## 2019-07-02 DIAGNOSIS — E782 Mixed hyperlipidemia: Secondary | ICD-10-CM | POA: Diagnosis not present

## 2019-07-02 DIAGNOSIS — E1100 Type 2 diabetes mellitus with hyperosmolarity without nonketotic hyperglycemic-hyperosmolar coma (NKHHC): Secondary | ICD-10-CM | POA: Diagnosis not present

## 2019-07-02 DIAGNOSIS — E1165 Type 2 diabetes mellitus with hyperglycemia: Secondary | ICD-10-CM | POA: Diagnosis not present

## 2019-07-02 DIAGNOSIS — I10 Essential (primary) hypertension: Secondary | ICD-10-CM | POA: Diagnosis not present

## 2019-07-02 DIAGNOSIS — J4530 Mild persistent asthma, uncomplicated: Secondary | ICD-10-CM | POA: Diagnosis not present

## 2019-07-16 DIAGNOSIS — Z124 Encounter for screening for malignant neoplasm of cervix: Secondary | ICD-10-CM | POA: Diagnosis not present

## 2019-07-17 DIAGNOSIS — S83282A Other tear of lateral meniscus, current injury, left knee, initial encounter: Secondary | ICD-10-CM | POA: Diagnosis not present

## 2019-07-17 DIAGNOSIS — M170 Bilateral primary osteoarthritis of knee: Secondary | ICD-10-CM | POA: Diagnosis not present

## 2019-07-17 DIAGNOSIS — G894 Chronic pain syndrome: Secondary | ICD-10-CM | POA: Diagnosis not present

## 2019-07-17 DIAGNOSIS — M5136 Other intervertebral disc degeneration, lumbar region: Secondary | ICD-10-CM | POA: Diagnosis not present

## 2019-07-28 DIAGNOSIS — E782 Mixed hyperlipidemia: Secondary | ICD-10-CM | POA: Diagnosis not present

## 2019-07-28 DIAGNOSIS — R1011 Right upper quadrant pain: Secondary | ICD-10-CM | POA: Diagnosis not present

## 2019-07-28 DIAGNOSIS — E1165 Type 2 diabetes mellitus with hyperglycemia: Secondary | ICD-10-CM | POA: Diagnosis not present

## 2019-07-28 DIAGNOSIS — E1100 Type 2 diabetes mellitus with hyperosmolarity without nonketotic hyperglycemic-hyperosmolar coma (NKHHC): Secondary | ICD-10-CM | POA: Diagnosis not present

## 2019-07-28 DIAGNOSIS — J0100 Acute maxillary sinusitis, unspecified: Secondary | ICD-10-CM | POA: Diagnosis not present

## 2019-07-31 ENCOUNTER — Other Ambulatory Visit: Payer: Self-pay | Admitting: Student

## 2019-08-03 ENCOUNTER — Other Ambulatory Visit: Payer: Self-pay | Admitting: Physician Assistant

## 2019-08-03 DIAGNOSIS — R7989 Other specified abnormal findings of blood chemistry: Secondary | ICD-10-CM

## 2019-08-03 DIAGNOSIS — R1011 Right upper quadrant pain: Secondary | ICD-10-CM

## 2019-08-07 DIAGNOSIS — E1100 Type 2 diabetes mellitus with hyperosmolarity without nonketotic hyperglycemic-hyperosmolar coma (NKHHC): Secondary | ICD-10-CM | POA: Diagnosis not present

## 2019-08-07 DIAGNOSIS — J4530 Mild persistent asthma, uncomplicated: Secondary | ICD-10-CM | POA: Diagnosis not present

## 2019-08-07 DIAGNOSIS — I10 Essential (primary) hypertension: Secondary | ICD-10-CM | POA: Diagnosis not present

## 2019-08-07 DIAGNOSIS — E782 Mixed hyperlipidemia: Secondary | ICD-10-CM | POA: Diagnosis not present

## 2019-08-07 DIAGNOSIS — E1165 Type 2 diabetes mellitus with hyperglycemia: Secondary | ICD-10-CM | POA: Diagnosis not present

## 2019-08-17 ENCOUNTER — Other Ambulatory Visit: Payer: BLUE CROSS/BLUE SHIELD

## 2019-08-17 DIAGNOSIS — M170 Bilateral primary osteoarthritis of knee: Secondary | ICD-10-CM | POA: Diagnosis not present

## 2019-08-17 DIAGNOSIS — Z79899 Other long term (current) drug therapy: Secondary | ICD-10-CM | POA: Diagnosis not present

## 2019-08-17 DIAGNOSIS — Z79891 Long term (current) use of opiate analgesic: Secondary | ICD-10-CM | POA: Diagnosis not present

## 2019-08-17 DIAGNOSIS — G894 Chronic pain syndrome: Secondary | ICD-10-CM | POA: Diagnosis not present

## 2019-08-17 DIAGNOSIS — S83282A Other tear of lateral meniscus, current injury, left knee, initial encounter: Secondary | ICD-10-CM | POA: Diagnosis not present

## 2019-08-17 DIAGNOSIS — M5136 Other intervertebral disc degeneration, lumbar region: Secondary | ICD-10-CM | POA: Diagnosis not present

## 2019-08-18 ENCOUNTER — Ambulatory Visit
Admission: RE | Admit: 2019-08-18 | Discharge: 2019-08-18 | Disposition: A | Discharge status: Home / Self Care | Payer: BLUE CROSS/BLUE SHIELD | Source: Ambulatory Visit | Attending: Physician Assistant | Admitting: Physician Assistant

## 2019-08-18 DIAGNOSIS — K760 Fatty (change of) liver, not elsewhere classified: Secondary | ICD-10-CM | POA: Diagnosis not present

## 2019-08-18 DIAGNOSIS — R7989 Other specified abnormal findings of blood chemistry: Secondary | ICD-10-CM

## 2019-08-18 DIAGNOSIS — R1011 Right upper quadrant pain: Secondary | ICD-10-CM

## 2019-08-21 DIAGNOSIS — J4530 Mild persistent asthma, uncomplicated: Secondary | ICD-10-CM | POA: Diagnosis not present

## 2019-08-21 DIAGNOSIS — E1165 Type 2 diabetes mellitus with hyperglycemia: Secondary | ICD-10-CM | POA: Diagnosis not present

## 2019-08-21 DIAGNOSIS — E782 Mixed hyperlipidemia: Secondary | ICD-10-CM | POA: Diagnosis not present

## 2019-08-21 DIAGNOSIS — I10 Essential (primary) hypertension: Secondary | ICD-10-CM | POA: Diagnosis not present

## 2019-08-21 DIAGNOSIS — G4733 Obstructive sleep apnea (adult) (pediatric): Secondary | ICD-10-CM | POA: Diagnosis not present

## 2019-08-31 DIAGNOSIS — E119 Type 2 diabetes mellitus without complications: Secondary | ICD-10-CM | POA: Diagnosis not present

## 2019-09-09 DIAGNOSIS — I10 Essential (primary) hypertension: Secondary | ICD-10-CM | POA: Diagnosis not present

## 2019-09-09 DIAGNOSIS — G4733 Obstructive sleep apnea (adult) (pediatric): Secondary | ICD-10-CM | POA: Diagnosis not present

## 2019-09-09 DIAGNOSIS — E782 Mixed hyperlipidemia: Secondary | ICD-10-CM | POA: Diagnosis not present

## 2019-09-09 DIAGNOSIS — J4530 Mild persistent asthma, uncomplicated: Secondary | ICD-10-CM | POA: Diagnosis not present

## 2019-09-09 DIAGNOSIS — E1165 Type 2 diabetes mellitus with hyperglycemia: Secondary | ICD-10-CM | POA: Diagnosis not present

## 2019-09-15 DIAGNOSIS — G894 Chronic pain syndrome: Secondary | ICD-10-CM | POA: Diagnosis not present

## 2019-09-15 DIAGNOSIS — S83282A Other tear of lateral meniscus, current injury, left knee, initial encounter: Secondary | ICD-10-CM | POA: Diagnosis not present

## 2019-09-15 DIAGNOSIS — M5136 Other intervertebral disc degeneration, lumbar region: Secondary | ICD-10-CM | POA: Diagnosis not present

## 2019-09-15 DIAGNOSIS — M170 Bilateral primary osteoarthritis of knee: Secondary | ICD-10-CM | POA: Diagnosis not present

## 2019-10-14 DIAGNOSIS — M545 Low back pain: Secondary | ICD-10-CM | POA: Diagnosis not present

## 2019-10-14 DIAGNOSIS — M5136 Other intervertebral disc degeneration, lumbar region: Secondary | ICD-10-CM | POA: Diagnosis not present

## 2019-10-14 DIAGNOSIS — G894 Chronic pain syndrome: Secondary | ICD-10-CM | POA: Diagnosis not present

## 2019-10-14 DIAGNOSIS — Z79891 Long term (current) use of opiate analgesic: Secondary | ICD-10-CM | POA: Diagnosis not present

## 2019-10-14 DIAGNOSIS — M170 Bilateral primary osteoarthritis of knee: Secondary | ICD-10-CM | POA: Diagnosis not present

## 2019-10-14 DIAGNOSIS — Z79899 Other long term (current) drug therapy: Secondary | ICD-10-CM | POA: Diagnosis not present

## 2019-10-21 ENCOUNTER — Telehealth: Payer: Self-pay | Admitting: Skilled Nursing Facility1

## 2019-10-21 ENCOUNTER — Encounter: Payer: Medicare Other | Admitting: Skilled Nursing Facility1

## 2019-10-21 DIAGNOSIS — I10 Essential (primary) hypertension: Secondary | ICD-10-CM | POA: Diagnosis not present

## 2019-10-21 DIAGNOSIS — E1165 Type 2 diabetes mellitus with hyperglycemia: Secondary | ICD-10-CM | POA: Diagnosis not present

## 2019-10-21 DIAGNOSIS — G4733 Obstructive sleep apnea (adult) (pediatric): Secondary | ICD-10-CM | POA: Diagnosis not present

## 2019-10-21 DIAGNOSIS — J4530 Mild persistent asthma, uncomplicated: Secondary | ICD-10-CM | POA: Diagnosis not present

## 2019-10-21 DIAGNOSIS — E782 Mixed hyperlipidemia: Secondary | ICD-10-CM | POA: Diagnosis not present

## 2019-10-21 NOTE — Telephone Encounter (Signed)
Pt had dietitian send rescheduled appt details via e-mail.  May 27th Thursday at 11:30am please get here 10 minutes early to fill out paperwork.  [place]

## 2019-11-04 ENCOUNTER — Other Ambulatory Visit: Payer: Self-pay

## 2019-11-05 ENCOUNTER — Encounter: Payer: Self-pay | Admitting: Skilled Nursing Facility1

## 2019-11-05 ENCOUNTER — Other Ambulatory Visit: Payer: Self-pay

## 2019-11-05 ENCOUNTER — Encounter: Payer: Medicare Other | Attending: Physician Assistant | Admitting: Skilled Nursing Facility1

## 2019-11-05 DIAGNOSIS — E119 Type 2 diabetes mellitus without complications: Secondary | ICD-10-CM | POA: Insufficient documentation

## 2019-11-05 NOTE — Progress Notes (Addendum)
Pt states she had a more recent A1C taken which was 8.somthing (not in Epic so no access). Pt states she is now taking lantus. Pt states her stomach feels really bloated with pain wrapping around to her back: Dietitian advised pt to speak with her doctor about this. Pt states she will be seeing an endocrinologist soon. Pt states she checks her blood sugars 1-2 times a day: sometimes fasting sometimes after eating: fasting: 140-150; post prandial: 180+ Pt states it is hard to exercsie due to back and knee pain but tries to walk.  Pt states she lives with her parents and her mom cooks.   Body Composition Scale 11/05/2019  Current Body Weight 293.2  Total Body Fat % 47.8  Visceral Fat 19  Fat-Free Mass % 52.1   Total Body Water % 40.5  Muscle-Mass lbs 33.7  BMI 44.6  Body Fat Displacement          Torso  lbs 87         Left Leg  lbs 17.4         Right Leg  lbs 17.4         Left Arm  lbs 8.7         Right Arm   lbs 8.7    Education topics: Self monitoring hygiene How to create balanced meals Appropriate portion sizes of foods Physical Activities impact on blood sugars  Hypoglycemia: how to identify and correct  Nutrient foods verses calorically dense foods Appropriate flavorings for food and cooking methods  Pathophysiology of Diabetes   Handouts Given: Detailed [employer] "A guide to your diabetes" Book  Goals: Check your blood sugars 2 times a day fasting and 2 hours after a meal: have these logged for your appt tomorrow Never use the same needle more than once Create balanced meals including complex carbohydrates, lean protein, non starchy vegetables Talk with your mom about what she adds to the meals for flavor: avoid sugar, grease and fat back  Do not inject insulin into rough or discolored skin

## 2019-11-06 ENCOUNTER — Other Ambulatory Visit: Payer: Self-pay

## 2019-11-06 ENCOUNTER — Encounter: Payer: Self-pay | Admitting: Internal Medicine

## 2019-11-06 ENCOUNTER — Ambulatory Visit: Payer: Medicare Other | Admitting: Internal Medicine

## 2019-11-06 VITALS — BP 122/86 | HR 70 | Ht 68.0 in | Wt 293.0 lb

## 2019-11-06 DIAGNOSIS — E1165 Type 2 diabetes mellitus with hyperglycemia: Secondary | ICD-10-CM | POA: Diagnosis not present

## 2019-11-06 DIAGNOSIS — E1159 Type 2 diabetes mellitus with other circulatory complications: Secondary | ICD-10-CM

## 2019-11-06 LAB — POCT GLYCOSYLATED HEMOGLOBIN (HGB A1C): Hemoglobin A1C: 9.2 % — AB (ref 4.0–5.6)

## 2019-11-06 MED ORDER — TRULICITY 1.5 MG/0.5ML ~~LOC~~ SOAJ: 1.5000 mg | SUBCUTANEOUS | 11 refills | Status: DC

## 2019-11-06 NOTE — Progress Notes (Signed)
Patient ID: Breanna Berger, female   DOB: 11-09-1960, 59 y.o.   MRN: [id number]   This visit occurred during the SARS-CoV-2 public health emergency.  Safety protocols were in place, including screening questions prior to the visit, additional usage of staff PPE, and extensive cleaning of exam room while observing appropriate contact time as indicated for disinfecting solutions.   HPI: Breanna Berger is a 59 y.o.-year-old female, referred by her PCP, Trey Sailors, PA, for management of DM2, dx in 2000, insulin-dependent 08/2019, uncontrolled, with complications (hyperosmolar nonketotic state, diastolic CHF, microalbuminuria).  Reviewed HbA1c levels: 07/03/2019: HbA1c 10.9% Lab Results  Component Value Date   HGBA1C 8.5 05/01/2013   HGBA1C 8.8 11/21/2012   HGBA1C 9.8 (H) 05/05/2012   HGBA1C 6.9 (H) 03/31/2010   HGBA1C 7.0 (H) 11/25/2009   HGBA1C 7.1 04/22/2009   HGBA1C 6.8 12/10/2008   HGBA1C 6.8 07/29/2008   HGBA1C 6.8 10/21/2007   HGBA1C 7.0 07/24/2007   Pt is on a regimen of: - Metformin 1000 mg 2x a day, with meals - Amaryl 4 mg 2x a day in am after b'fast and at bedtime - Lantus 40 units at bedtime She was on Victoza ad then Trulicity >> stopped 00/8676 when started insulin.  Pt checks her sugars 1x a day and they are: - am: 130, 150-250 - 2h after b'fast: n/c - before lunch: n/c - 2h after lunch: n/c - before dinner: n/c - 2h after dinner: n/c - bedtime: 300s >> 200s - nighttime: n/c Lowest sugar was 130; she has hypoglycemia awareness at 130.  Highest sugar was 380.  Glucometer: ReliOn  Pt's meals are: - Breakfast: cheerios + 2% milk - Lunch: varies: fish sandwich from [employer];a  sub; salad with frilled chicken - Dinner: meat + veggies + starch - Snacks: cookies, candy Saw nutrition yesterday.  - no CKD, last BUN/creatinine:  07/03/2019: 10/0.83, GFR 91, glucose 412 Lab Results  Component Value Date   BUN 9 09/25/2016   BUN 13 04/25/2015   CREATININE  0.72 09/25/2016   CREATININE 0.71 04/25/2015  07/03/2019: UA with 3+ glucose and negative protein On lisinopril 10.  -+ HL; last set of lipids: 07/03/2019: 257/665/41/102 Lab Results  Component Value Date   CHOL 200 05/01/2013   HDL 46 05/01/2013   LDLCALC 78 05/01/2013   TRIG 380 (H) 05/01/2013   CHOLHDL 4.3 05/01/2013  On Lipitor 40, Vascepa 2 g twice a day - decreased b/c palpitations.  Of note, she also has elevated AST and ALT: 04/01/2020: AST 64 (0-40), ALT 57 (0-32)  - last eye exam was in 05/2019: No DR reportedly.   - no numbness and tingling in her feet.  Pt has FH of DM1 in PGM.  She also has a history of HTN, OSA, OA of knees, morbid obesity, elevated LFTs.  ROS: Constitutional: + weight gain, no weight loss, no fatigue, no subjective hyperthermia, no subjective hypothermia, no nocturia, + poor sleep Eyes: no blurry vision, no xerophthalmia ENT: no sore throat, no nodules palpated in neck, no dysphagia, no odynophagia, + hoarseness, no tinnitus, no hypoacusis Cardiovascular: no CP, + SOB, no palpitations, + leg swelling Respiratory: no cough, + SOB, no wheezing Gastrointestinal: no N, no V, no D, + C, no acid reflux Musculoskeletal: no muscle, + joint aches Skin: no rash, no hair loss, + itching Neurological: no tremors, no numbness or tingling/no dizziness/no HAs Psychiatric: no depression, no anxiety + Low libido  Past Medical History:  Diagnosis Date  . Abdominal pain,  unspecified site 08/24/2013  . Diabetes mellitus   . Dyspnea 08/24/2013  . HLD (hyperlipidemia)   . MVP (mitral valve prolapse)   . Sinusitis   . Suppurative hidradenitis 07/27/2013  . SVT (supraventricular tachycardia) ([place]) 09/09/2014   converted with vagal   . UTI (lower urinary tract infection) 07/27/2013   Past Surgical History:  Procedure Laterality Date  . CESAREAN SECTION    . CESAREAN SECTION WITH BILATERAL TUBAL LIGATION    . CHOLECYSTECTOMY    . HERNIA REPAIR    . UMBILICAL  HERNIA REPAIR     Social History   Socioeconomic History  . Marital status: Single    Spouse name: Not on file  . Number of children: 3  . Years of education: Not on file  . Highest education level: Not on file  Occupational History  . Occupation:  Retired  Tobacco Use  . Smoking status: Former Smoker    Years: 15.00    Types: Cigarettes    Quit date: 05/12/2015    Years since quitting: 4.4  . Smokeless tobacco: Never Used  Substance and Sexual Activity  . Alcohol use: No    Alcohol/week: 0.0 standard drinks  . Drug use: No  . Sexual activity: Yes    Birth control/protection: Surgical  Other Topics Concern  . Not on file  Social History Narrative  . Not on file   Social Determinants of Health   Financial Resource Strain:   . Difficulty of Paying Living Expenses:   Food Insecurity:   . Worried About Charity fundraiser in the Last Year:   . Arboriculturist in the Last Year:   Transportation Needs:   . Film/video editor (Medical):   Marland Kitchen Lack of Transportation (Non-Medical):   Physical Activity:   . Days of Exercise per Week:   . Minutes of Exercise per Session:   Stress:   . Feeling of Stress :   Social Connections:   . Frequency of Communication with Friends and Family:   . Frequency of Social Gatherings with Friends and Family:   . Attends Religious Services:   . Active Member of Clubs or Organizations:   . Attends Archivist Meetings:   Marland Kitchen Marital Status:   Intimate Partner Violence:   . Fear of Current or Ex-Partner:   . Emotionally Abused:   Marland Kitchen Physically Abused:   . Sexually Abused:    Current Outpatient Medications on File Prior to Visit  Medication Sig Dispense Refill  . atorvastatin (LIPITOR) 40 MG tablet Take 40 mg by mouth every evening.  0  . glimepiride (AMARYL) 4 MG tablet Take 4 mg by mouth 2 (two) times daily.     Marland Kitchen LANTUS SOLOSTAR 100 UNIT/ML Solostar Pen Inject 40 Units into the skin at bedtime.    . metFORMIN (GLUCOPHAGE) 1000  MG tablet Take 1 tablet (1,000 mg total) by mouth 2 (two) times daily with a meal. 60 tablet 0  . metoprolol succinate (TOPROL-XL) 50 MG 24 hr tablet Take 50 mg by mouth daily. Take with or immediately following a meal.    . albuterol (PROVENTIL HFA;VENTOLIN HFA) 108 (90 BASE) MCG/ACT inhaler Inhale 2 puffs into the lungs every 6 (six) hours as needed for wheezing or shortness of breath. 18 g 3  . erythromycin ophthalmic ointment Place 1 application into both eyes as directed.  0  . lidocaine (LIDODERM) 5 % Place 1 patch onto the skin daily. Remove & Discard patch within 12  hours or as directed by MD 30 patch 0  . meloxicam (MOBIC) 15 MG tablet Take 1 tablet by mouth every morning.  0   No current facility-administered medications on file prior to visit.   Allergies  Allergen Reactions  . Erythromycin Hives, Itching and Swelling  . Keflex [Cephalexin] Hives, Itching and Swelling  . Doxycycline Hyclate Rash   Family History  Problem Relation Age of Onset  . Diabetes Paternal Grandmother   . Heart disease Paternal Aunt   . Clotting disorder Mother   . Lymphoma Mother   . Stomach cancer Maternal Aunt   . Colon cancer Maternal Aunt   . Prostate cancer Maternal Uncle   . Heart disease Paternal Grandfather   . Coronary artery disease Paternal Grandfather   . Lung cancer Maternal Uncle   + Multiple myeloma in mother  PE: BP 122/86   Pulse 70   Ht '5\' 8"'$  (1.727 m)   Wt 293 lb (132.9 kg)   SpO2 97%   BMI 44.55 kg/m  Wt Readings from Last 3 Encounters:  11/06/19 293 lb (132.9 kg)  11/05/19 293 lb 3.2 oz (133 kg)  09/25/16 270 lb (122.5 kg)   Constitutional: overweight, in NAD Eyes: PERRLA, EOMI, no exophthalmos ENT: moist mucous membranes, no thyromegaly, no cervical lymphadenopathy Cardiovascular: RRR, No MRG Respiratory: CTA B Gastrointestinal: abdomen soft, NT, ND, BS+ Musculoskeletal: no deformities, strength intact in all 4 Skin: moist, warm, no rashes Neurological: no  tremor with outstretched hands, DTR normal in all 4  ASSESSMENT: 1. DM2, non-insulin-dependent, uncontrolled, with long-term complications - dCHF - Microalbuminuria  PLAN:  1. Patient with long-standing, uncontrolled diabetes, on oral antidiabetic regimen with Metformin and sulfonylurea and also long-acting insulin and daily GLP-1 receptor agonist, which became insufficient.  Latest HbA1c was reviewed from 07/03/2019 and this was elevated at 10.9%.  At today's visit, HbA1c is 9.2% (better after adding insulin). -At this visit, we discussed about her diet.  We adjusted her breakfast, which now includes cereals with whole milk and I suggested a lower carb cereal and almond milk along with nuts and berries.  Also, I suggested against fried foods, which will greatly elevate her blood sugars.  She also drinks sweet drinks which I strongly advised her to stop.  She also saw nutrition yesterday. -Her sugars appear to be fluctuating, but usually above goal in the morning and at bedtime, when she checks.  For now, we discussed about the benefits of adding a GLP-1 receptor agonist.  She agrees to restart Trulicity, which she tolerated well in the past.  We will start at the 1.5 mg weekly, dose that she has at home and she tolerated well, and we will most likely need to increase to 3 mg daily at next visit.  Also, at next visit, we may need to check her insulin production and possibly start an SGLT 2 inhibitor afterwards, since she does have a history of CHF. -For now, we will continue Amaryl but move the tablet 15 to 30 minutes before meals.  I am planning to stop sulfonylurea as soon as possible in the near future.  For now, we need all the help that we can get with her blood sugar control, therefore, we will continue these. - I suggested to:  Patient Instructions  Please continue: - Metformin 1000 mg 2x a day, with meals - Lantus 40 units at bedtime  Please move: - Amaryl 4 mg 2x a day 15-30 min before  meals  Please start: -  Trulicity 1.5 mg weekly - on Sunday mornings.  Please return in 1.5 months with your sugar log.   - Strongly advised her to start checking sugars at different times of the day - check 1-2x a day, rotating checks - discussed about CBG targets for treatment: 80-130 mg/dL before meals and <180 mg/dL after meals; target HbA1c <7%. - given sugar log and advised how to fill it and to bring it at next appt  - given foot care handout and explained the principles  - given instructions for hypoglycemia management "15-15 rule"  - advised for yearly eye exams  - Return to clinic in 1.5 mo with sugar log   Philemon Kingdom, MD PhD [facility]

## 2019-11-06 NOTE — Patient Instructions (Addendum)
Please continue: - Metformin 1000 mg 2x a day, with meals - Lantus 40 units at bedtime  Please move: - Amaryl 4 mg 2x a day 15-30 min before meals  Please start: - Trulicity 1.5 mg weekly - on Sunday mornings.  Please return in 1.5 months with your sugar log.   PATIENT INSTRUCTIONS FOR TYPE 2 DIABETES:  **Please join MyChart!** - see attached instructions about how to join if you have not done so already.  DIET AND EXERCISE Diet and exercise is an important part of diabetic treatment.  We recommended aerobic exercise in the form of brisk walking (working between 40-60% of maximal aerobic capacity, similar to brisk walking) for 150 minutes per week (such as 30 minutes five days per week) along with 3 times per week performing 'resistance' training (using various gauge rubber tubes with handles) 5-10 exercises involving the major muscle groups (upper body, lower body and core) performing 10-15 repetitions (or near fatigue) each exercise. Start at half the above goal but build slowly to reach the above goals. If limited by weight, joint pain, or disability, we recommend daily walking in a swimming pool with water up to waist to reduce pressure from joints while allow for adequate exercise.    BLOOD GLUCOSES Monitoring your blood glucoses is important for continued management of your diabetes. Please check your blood glucoses 2-4 times a day: fasting, before meals and at bedtime (you can rotate these measurements - e.g. one day check before the 3 meals, the next day check before 2 of the meals and before bedtime, etc.).   HYPOGLYCEMIA (low blood sugar) Hypoglycemia is usually a reaction to not eating, exercising, or taking too much insulin/ other diabetes drugs.  Symptoms include tremors, sweating, hunger, confusion, headache, etc. Treat IMMEDIATELY with 15 grams of Carbs: . 4 glucose tablets .  cup regular juice/soda . 2 tablespoons raisins . 4 teaspoons sugar . 1 tablespoon  honey Recheck blood glucose in 15 mins and repeat above if still symptomatic/blood glucose <100.  RECOMMENDATIONS TO REDUCE YOUR RISK OF DIABETIC COMPLICATIONS: * Take your prescribed MEDICATION(S) * Follow a DIABETIC diet: Complex carbs, fiber rich foods, (monounsaturated and polyunsaturated) fats * AVOID saturated/trans fats, high fat foods, >2,300 mg salt per day. * EXERCISE at least 5 times a week for 30 minutes or preferably daily.  * DO NOT SMOKE OR DRINK more than 1 drink a day. * Check your FEET every day. Do not wear tightfitting shoes. Contact us if you develop an ulcer * See your EYE doctor once a year or more if needed * Get a FLU shot once a year * Get a PNEUMONIA vaccine once before and once after age 72 years  GOALS:  * Your Hemoglobin A1c of <7%  * fasting sugars need to be <130 * after meals sugars need to be <180 (2h after you start eating) * Your Systolic BP should be [id number] or lower  * Your Diastolic BP should be 80 or lower  * Your HDL (Good Cholesterol) should be 40 or higher  * Your LDL (Bad Cholesterol) should be 100 or lower. * Your Triglycerides should be 150 or lower  * Your Urine microalbumin (kidney function) should be <30 * Your Body Mass Index should be 25 or lower    Please consider the following ways to cut down carbs and fat and increase fiber and micronutrients in your diet: - substitute whole grain for white bread or pasta - substitute brown rice for white rice -  substitute 90-calorie flat bread pieces for slices of bread when possible - substitute sweet potatoes or yams for white potatoes - substitute humus for margarine - substitute tofu for cheese when possible - substitute almond or rice milk for regular milk (would not drink soy milk daily due to concern for soy estrogen influence on breast cancer risk) - substitute dark chocolate for other sweets when possible - substitute water - can add lemon or orange slices for taste - for diet sodas  (artificial sweeteners will trick your body that you can eat sweets without getting calories and will lead you to overeating and weight gain in the long run) - do not skip breakfast or other meals (this will slow down the metabolism and will result in more weight gain over time)  - can try smoothies made from fruit and almond/rice milk in am instead of regular breakfast - can also try old-fashioned (not instant) oatmeal made with almond/rice milk in am - order the dressing on the side when eating salad at a restaurant (pour less than half of the dressing on the salad) - eat as little meat as possible - can try juicing, but should not forget that juicing will get rid of the fiber, so would alternate with eating raw veg./fruits or drinking smoothies - use as little oil as possible, even when using olive oil - can dress a salad with a mix of balsamic vinegar and lemon juice, for e.g. - use agave nectar, stevia sugar, or regular sugar rather than artificial sweateners - steam or broil/roast veggies  - snack on veggies/fruit/nuts (unsalted, preferably) when possible, rather than processed foods - reduce or eliminate aspartame in diet (it is in diet sodas, chewing gum, etc) Read the labels!  Try to read Dr. Janene Harvey book: "Program for Reversing Diabetes" for other ideas for healthy eating.

## 2019-11-06 NOTE — Addendum Note (Signed)
Addended by: Cardell Peach I on: 11/06/2019 09:24 AM   Modules accepted: Orders

## 2019-11-11 DIAGNOSIS — S83282A Other tear of lateral meniscus, current injury, left knee, initial encounter: Secondary | ICD-10-CM | POA: Diagnosis not present

## 2019-11-11 DIAGNOSIS — G894 Chronic pain syndrome: Secondary | ICD-10-CM | POA: Diagnosis not present

## 2019-11-11 DIAGNOSIS — M170 Bilateral primary osteoarthritis of knee: Secondary | ICD-10-CM | POA: Diagnosis not present

## 2019-11-11 DIAGNOSIS — M5136 Other intervertebral disc degeneration, lumbar region: Secondary | ICD-10-CM | POA: Diagnosis not present

## 2019-12-04 DIAGNOSIS — Z794 Long term (current) use of insulin: Secondary | ICD-10-CM | POA: Diagnosis not present

## 2019-12-04 DIAGNOSIS — E782 Mixed hyperlipidemia: Secondary | ICD-10-CM | POA: Diagnosis not present

## 2019-12-04 DIAGNOSIS — R0602 Shortness of breath: Secondary | ICD-10-CM | POA: Diagnosis not present

## 2019-12-04 DIAGNOSIS — E1165 Type 2 diabetes mellitus with hyperglycemia: Secondary | ICD-10-CM | POA: Diagnosis not present

## 2019-12-04 DIAGNOSIS — M546 Pain in thoracic spine: Secondary | ICD-10-CM | POA: Diagnosis not present

## 2019-12-07 DIAGNOSIS — G894 Chronic pain syndrome: Secondary | ICD-10-CM | POA: Diagnosis not present

## 2019-12-07 DIAGNOSIS — M545 Low back pain: Secondary | ICD-10-CM | POA: Diagnosis not present

## 2019-12-07 DIAGNOSIS — M5136 Other intervertebral disc degeneration, lumbar region: Secondary | ICD-10-CM | POA: Diagnosis not present

## 2019-12-07 DIAGNOSIS — M170 Bilateral primary osteoarthritis of knee: Secondary | ICD-10-CM | POA: Diagnosis not present

## 2019-12-08 ENCOUNTER — Other Ambulatory Visit: Payer: Self-pay

## 2019-12-08 ENCOUNTER — Encounter: Payer: Medicare Other | Attending: Physician Assistant | Admitting: Skilled Nursing Facility1

## 2019-12-08 ENCOUNTER — Telehealth: Payer: Self-pay | Admitting: Internal Medicine

## 2019-12-08 DIAGNOSIS — E119 Type 2 diabetes mellitus without complications: Secondary | ICD-10-CM

## 2019-12-08 MED ORDER — LANTUS SOLOSTAR 100 UNIT/ML ~~LOC~~ SOPN: 40.0000 [IU] | PEN_INJECTOR | Freq: Every day | SUBCUTANEOUS | 1 refills | Status: DC

## 2019-12-08 NOTE — Telephone Encounter (Signed)
RX sent

## 2019-12-08 NOTE — Telephone Encounter (Signed)
  LAST APPOINTMENT DATE: 11/06/2019   NEXT APPOINTMENT DATE:@7 /14/2021  MEDICATION: LANTUS SOLOSTAR 100 UNIT/ML Solostar Pen  PHARMACY: [place], [address] Phone:  [phone]  Fax:  [phone]      Comments: Patient states that she is almost out and pharmacy has zero refills

## 2019-12-08 NOTE — Progress Notes (Addendum)
Pt given sticker: YES  Pt states she has met with her endo and states she was told to keep her mediation management. Pt states she needs her insulin refilled. Pt states she has been checking her blood sugars checking 1-2 times a day: 130-140 something. Pt states she has not been keeping a log of her sugars: dietitian advised she keep a log as directed by her endocrinologist. Pt admits to forgetting to check her blood sugars stating she does not like pricking her fingers.  Pt states she did start trulcitiy. Pt states she has tried to cut back on fried foods by trying new recipes which has been challanging. Pt state she has been helping her mom to cook healthier too. Pt states if her mom fries she will make her own foods.  Pt states she is trying to have food she likes but less portions. Pt states she tries to limit bread with meals. Pt states she feels she has more energy than before. Pt states she loves fruits and vegetables. Pt statea when she is out and gets hungry she tries to choose haalthier options now. Pt states she does sparking water instead of soda now.  A1C 9.2  24 hr recall: Breakfast: wheaties and 2% milk Lunch: tuna salad on white bread Dinner: greens + chicken   Body Composition Scale 11/05/2019  Current Body Weight 293.2  Total Body Fat % 47.8  Visceral Fat 19  Fat-Free Mass % 52.1   Total Body Water % 40.5  Muscle-Mass lbs 33.7  BMI 44.6  Body Fat Displacement          Torso  lbs 87         Left Leg  lbs 17.4         Right Leg  lbs 17.4         Left Arm  lbs 8.7         Right Arm   lbs 8.7    Education topics: Reinforcing previous education introducing more information on activity   Handouts Given: Healthier fast food options Should I eat sheet Check box activity   Goals: When checking your blood sugar check at different times sometimes before eating breakfast and sometimes 2 hours after a meal: check your blood sugar 2 hours after your cereal Limit nuts to 1/4  cup  Use fruits as snacks or with breakfast Create balanced meals including complex carbohydrates, lean protein, non starchy vegetables Aim to be as active as your body allows For recipes:   Diabetes food hub  MyPlate Recipes  When checking your blood sugar check at different times sometimes before eating breakfast and sometimes 2 hours after a meal: check your blood sugar after your cereal Limit nuts to 1/4 cup  Use fruits as snacks or with breakfast Create balanced meals including complex carbohydrates, lean protein, non-starchy vegetables Aim to be as active as your body allows

## 2019-12-13 DIAGNOSIS — R1084 Generalized abdominal pain: Secondary | ICD-10-CM | POA: Diagnosis not present

## 2019-12-13 DIAGNOSIS — E119 Type 2 diabetes mellitus without complications: Secondary | ICD-10-CM | POA: Diagnosis not present

## 2019-12-13 DIAGNOSIS — R82998 Other abnormal findings in urine: Secondary | ICD-10-CM | POA: Diagnosis not present

## 2019-12-13 DIAGNOSIS — M545 Low back pain: Secondary | ICD-10-CM | POA: Diagnosis not present

## 2019-12-13 DIAGNOSIS — N390 Urinary tract infection, site not specified: Secondary | ICD-10-CM | POA: Diagnosis not present

## 2019-12-17 DIAGNOSIS — L680 Hirsutism: Secondary | ICD-10-CM | POA: Diagnosis not present

## 2019-12-17 DIAGNOSIS — D485 Neoplasm of uncertain behavior of skin: Secondary | ICD-10-CM | POA: Diagnosis not present

## 2019-12-23 ENCOUNTER — Encounter: Payer: Self-pay | Admitting: Internal Medicine

## 2019-12-23 ENCOUNTER — Ambulatory Visit (INDEPENDENT_AMBULATORY_CARE_PROVIDER_SITE_OTHER): Payer: Medicare Other | Admitting: Internal Medicine

## 2019-12-23 DIAGNOSIS — Z5329 Procedure and treatment not carried out because of patient's decision for other reasons: Secondary | ICD-10-CM

## 2019-12-23 NOTE — Progress Notes (Signed)
Patient ID: Breanna Berger, female   DOB: 1961/04/11, 59 y.o.   MRN: [id number]   CANCELLED <30 min before her appointment time.  HPI: Devaney Segers is a 59 y.o.-year-old female, initially referred by her PCP, Osei-Bonsu, Iona Beard, MD, returning for follow-up for DM2, dx in 2000, insulin-dependent 08/2019, uncontrolled, with complications (hyperosmolar nonketotic state, diastolic CHF, microalbuminuria).  Our first visit was 1.5 months ago.  Reviewed HbA1c levels: Lab Results  Component Value Date   HGBA1C 9.2 (A) 11/06/2019   HGBA1C 8.5 05/01/2013   HGBA1C 8.8 11/21/2012   HGBA1C 9.8 (H) 05/05/2012   HGBA1C 6.9 (H) 03/31/2010   HGBA1C 7.0 (H) 11/25/2009   HGBA1C 7.1 04/22/2009   HGBA1C 6.8 12/10/2008   HGBA1C 6.8 07/29/2008   HGBA1C 6.8 10/21/2007  07/03/2019: HbA1c 10.9%  At last visit she was on: - Metformin 1000 mg 2x a day, with meals - Amaryl 4 mg 2x a day in am after b'fast and at bedtime - Lantus 40 units at bedtime She was on Victoza ad then Trulicity >> stopped 15/1761 when started insulin.  We changed to: - Metformin 1000 mg 2x a day, with meals - Amaryl 4 mg 2x a day 15-30 min before meals - Trulicity 1.5 mg weekly - Lantus 40 units at bedtime  Please return in 2 months with your sugar log.  1x a day and they are: - am: 130, 150-250 - 2h after b'fast: n/c - before lunch: n/c - 2h after lunch: n/c - before dinner: n/c - 2h after dinner: n/c - bedtime: 300s >> 200s - nighttime: n/c Lowest sugar was 130; she has hypoglycemia awareness at 130.  Highest sugar was 380.  Glucometer: ReliOn  Pt's meals are: - Breakfast: cheerios + 2% milk - Lunch: varies: fish sandwich from [employer];a  sub; salad with frilled chicken - Dinner: meat + veggies + starch - Snacks: cookies, candy  -No CKD, last BUN/creatinine:  07/03/2019: 10/0.83, GFR 91, glucose 412 Lab Results  Component Value Date   BUN 9 09/25/2016   BUN 13 04/25/2015   CREATININE 0.72 09/25/2016    CREATININE 0.71 04/25/2015  07/03/2019: UA with 3+ glucose and negative protein On lisinopril 10.  -+ HL; last set of lipids: 07/03/2019: 257/665/41/102 Lab Results  Component Value Date   CHOL 200 05/01/2013   HDL 46 05/01/2013   LDLCALC 78 05/01/2013   TRIG 380 (H) 05/01/2013   CHOLHDL 4.3 05/01/2013  On Lipitor 40, Vascepa 2 g twice a day-decreased due to palpitations.  Of note, she also has elevated AST and ALT: 04/01/2020: AST 64 (0-40), ALT 57 (0-32)  - last eye exam was in 05/2019: No DR reportedly.   - no numbness and tingling in her feet.  Pt has FH of DM1 in PGM.  She also has HTN, OSA, OA of knees, morbid obesity, elevated LFTs.  ROS: Constitutional: no weight gain/no weight loss, no fatigue, no subjective hyperthermia, no subjective hypothermia Eyes: no blurry vision, no xerophthalmia ENT: no sore throat, no nodules palpated in neck, no dysphagia, no odynophagia, no hoarseness Cardiovascular: no CP/no SOB/no palpitations/no leg swelling Respiratory: no cough/no SOB/no wheezing Gastrointestinal: no N/no V/no D/no C/no acid reflux Musculoskeletal: no muscle aches/no joint aches Skin: no rashes, no hair loss Neurological: no tremors/no numbness/no tingling/no dizziness  I reviewed pt's medications, allergies, PMH, social hx, family hx, and changes were documented in the history of present illness. Otherwise, unchanged from my initial visit note.   Past Medical History:  Diagnosis Date  .  Abdominal pain, unspecified site 08/24/2013  . Diabetes mellitus   . Dyspnea 08/24/2013  . HLD (hyperlipidemia)   . MVP (mitral valve prolapse)   . Sinusitis   . Suppurative hidradenitis 07/27/2013  . SVT (supraventricular tachycardia) ([place]) 09/09/2014   converted with vagal   . UTI (lower urinary tract infection) 07/27/2013   Past Surgical History:  Procedure Laterality Date  . CESAREAN SECTION    . CESAREAN SECTION WITH BILATERAL TUBAL LIGATION    . CHOLECYSTECTOMY    .  HERNIA REPAIR    . UMBILICAL HERNIA REPAIR     Social History   Socioeconomic History  . Marital status: Single    Spouse name: Not on file  . Number of children: 3  . Years of education: Not on file  . Highest education level: Not on file  Occupational History  . Occupation:  Retired  Tobacco Use  . Smoking status: Former Smoker    Years: 15.00    Types: Cigarettes    Quit date: 05/12/2015    Years since quitting: 4.4  . Smokeless tobacco: Never Used  Substance and Sexual Activity  . Alcohol use: No    Alcohol/week: 0.0 standard drinks  . Drug use: No  . Sexual activity: Yes    Birth control/protection: Surgical  Other Topics Concern  . Not on file  Social History Narrative  . Not on file   Social Determinants of Health   Financial Resource Strain:   . Difficulty of Paying Living Expenses:   Food Insecurity:   . Worried About Charity fundraiser in the Last Year:   . Arboriculturist in the Last Year:   Transportation Needs:   . Film/video editor (Medical):   Marland Kitchen Lack of Transportation (Non-Medical):   Physical Activity:   . Days of Exercise per Week:   . Minutes of Exercise per Session:   Stress:   . Feeling of Stress :   Social Connections:   . Frequency of Communication with Friends and Family:   . Frequency of Social Gatherings with Friends and Family:   . Attends Religious Services:   . Active Member of Clubs or Organizations:   . Attends Archivist Meetings:   Marland Kitchen Marital Status:   Intimate Partner Violence:   . Fear of Current or Ex-Partner:   . Emotionally Abused:   Marland Kitchen Physically Abused:   . Sexually Abused:    Current Outpatient Medications on File Prior to Visit  Medication Sig Dispense Refill  . albuterol (PROVENTIL HFA;VENTOLIN HFA) 108 (90 BASE) MCG/ACT inhaler Inhale 2 puffs into the lungs every 6 (six) hours as needed for wheezing or shortness of breath. 18 g 3  . atorvastatin (LIPITOR) 40 MG tablet Take 40 mg by mouth every  evening.  0  . Dulaglutide (TRULICITY) 1.5 XT/0.2IO SOPN Inject 1.5 mg into the skin once a week. 4 pen 11  . glimepiride (AMARYL) 4 MG tablet Take 4 mg by mouth 2 (two) times daily.     Marland Kitchen LANTUS SOLOSTAR 100 UNIT/ML Solostar Pen Inject 40 Units into the skin at bedtime. 36 mL 1  . lidocaine (LIDODERM) 5 % Place 1 patch onto the skin daily. Remove & Discard patch within 12 hours or as directed by MD 30 patch 0  . meloxicam (MOBIC) 15 MG tablet Take 1 tablet by mouth every morning.  0  . metFORMIN (GLUCOPHAGE) 1000 MG tablet Take 1 tablet (1,000 mg total) by mouth 2 (two) times  daily with a meal. 60 tablet 0  . metoprolol succinate (TOPROL-XL) 50 MG 24 hr tablet Take 50 mg by mouth daily. Take with or immediately following a meal.     No current facility-administered medications on file prior to visit.   Allergies  Allergen Reactions  . Erythromycin Hives, Itching and Swelling  . Keflex [Cephalexin] Hives, Itching and Swelling  . Doxycycline Hyclate Rash   Family History  Problem Relation Age of Onset  . Diabetes Paternal Grandmother   . Heart disease Paternal Aunt   . Clotting disorder Mother   . Lymphoma Mother   . Stomach cancer Maternal Aunt   . Colon cancer Maternal Aunt   . Prostate cancer Maternal Uncle   . Heart disease Paternal Grandfather   . Coronary artery disease Paternal Grandfather   . Lung cancer Maternal Uncle   + Multiple myeloma in mother  PE: There were no vitals taken for this visit. Wt Readings from Last 3 Encounters:  12/08/19 289 lb 9.6 oz (131.4 kg)  11/06/19 293 lb (132.9 kg)  11/05/19 293 lb 3.2 oz (133 kg)   Constitutional: overweight, in NAD Eyes: PERRLA, EOMI, no exophthalmos ENT: moist mucous membranes, no thyromegaly, no cervical lymphadenopathy Cardiovascular: RRR, No MRG Respiratory: CTA B Gastrointestinal: abdomen soft, NT, ND, BS+ Musculoskeletal: no deformities, strength intact in all 4 Skin: moist, warm, no rashes Neurological: no  tremor with outstretched hands, DTR normal in all 4  ASSESSMENT: 1. DM2, non-insulin-dependent, uncontrolled, with long-term complications - dCHF - Microalbuminuria  2. HL  3.  Obesity class III  PLAN:  1. Patient with longstanding, uncontrolled, type 2 diabetes, on oral antidiabetic regimen with Metformin and sulfonylurea and also long-acting insulin and now back on weekly GLP-1 receptor agonist, added at last visit.  HbA1c improved at last visit from 10.9% to 9.2% (improved after addition of insulin).  At that time, sugars were fluctuating but usually above goal in the morning and at bedtime, when she was checking blood sugars.  We added back Trulicity at that time and I advised her to move Amaryl 15 to 30 minutes before meals.  I also advised her against fried foods and to switch to almond milk.  She did see nutrition last month.  - I suggested to:  Patient Instructions  Please continue: - Metformin 1000 mg 2x a day, with meals - Amaryl 4 mg 2x a day 15-30 min before meals - Trulicity 1.5 mg weekly  - Lantus 40 units at bedtime  Please return in 2-3 months with your sugar log.   - we checked her HbA1c: 7%  - advised to check sugars at different times of the day - 1-2x a day, rotating check times - advised for yearly eye exams >> she is UTD - return to clinic in 2-3 months  2. HL - Reviewed latest lipid panel from earlier this year: Triglycerides very high, in the 600s, LDL also above goal - Continues Lipitor 40 without side effects.  She is also on Vascepa.  3.  Obesity class III -We started Trulicity at last visit.  This should also help with weight loss. -She saw nutrition 12/08/2019 -She lost 4 pounds since last visit  Philemon Kingdom, MD PhD [facility]

## 2019-12-24 DIAGNOSIS — L280 Lichen simplex chronicus: Secondary | ICD-10-CM | POA: Diagnosis not present

## 2019-12-24 DIAGNOSIS — D485 Neoplasm of uncertain behavior of skin: Secondary | ICD-10-CM | POA: Diagnosis not present

## 2019-12-25 ENCOUNTER — Other Ambulatory Visit: Payer: Self-pay

## 2019-12-25 ENCOUNTER — Ambulatory Visit (INDEPENDENT_AMBULATORY_CARE_PROVIDER_SITE_OTHER): Payer: Medicare Other | Admitting: Internal Medicine

## 2019-12-25 ENCOUNTER — Encounter: Payer: Self-pay | Admitting: Internal Medicine

## 2019-12-25 VITALS — BP 118/80 | HR 82 | Ht 68.0 in | Wt 288.0 lb

## 2019-12-25 DIAGNOSIS — E669 Obesity, unspecified: Secondary | ICD-10-CM

## 2019-12-25 DIAGNOSIS — E782 Mixed hyperlipidemia: Secondary | ICD-10-CM

## 2019-12-25 DIAGNOSIS — R1084 Generalized abdominal pain: Secondary | ICD-10-CM | POA: Diagnosis not present

## 2019-12-25 DIAGNOSIS — E1159 Type 2 diabetes mellitus with other circulatory complications: Secondary | ICD-10-CM

## 2019-12-25 DIAGNOSIS — E1165 Type 2 diabetes mellitus with hyperglycemia: Secondary | ICD-10-CM | POA: Diagnosis not present

## 2019-12-25 LAB — POCT GLYCOSYLATED HEMOGLOBIN (HGB A1C): Hemoglobin A1C: 7.2 % — AB (ref 4.0–5.6)

## 2019-12-25 MED ORDER — ACCU-CHEK GUIDE ME W/DEVICE KIT: PACK | 0 refills | Status: AC

## 2019-12-25 MED ORDER — LANTUS SOLOSTAR 100 UNIT/ML ~~LOC~~ SOPN: 30.0000 [IU] | PEN_INJECTOR | Freq: Every day | SUBCUTANEOUS | 3 refills | Status: DC

## 2019-12-25 MED ORDER — TRULICITY 3 MG/0.5ML ~~LOC~~ SOAJ: 3.0000 mg | SUBCUTANEOUS | 3 refills | Status: DC

## 2019-12-25 MED ORDER — LANTUS SOLOSTAR 100 UNIT/ML ~~LOC~~ SOPN: 20.0000 [IU] | PEN_INJECTOR | Freq: Every day | SUBCUTANEOUS | 3 refills | Status: DC

## 2019-12-25 MED ORDER — ACCU-CHEK GUIDE VI STRP: ORAL_STRIP | 12 refills | Status: AC

## 2019-12-25 NOTE — Progress Notes (Signed)
Patient ID: Breanna Berger, female   DOB: December 26, 1960, 59 y.o.   MRN: [id number]   This visit occurred during the SARS-CoV-2 public health emergency.  Safety protocols were in place, including screening questions prior to the visit, additional usage of staff PPE, and extensive cleaning of exam room while observing appropriate contact time as indicated for disinfecting solutions.   HPI: Breanna Berger is a 59 y.o.-year-old female, initially referred by her PCP, Osei-Bonsu, Greggory Stallion, MD, returning for follow-up for DM2, dx in 2000, insulin-dependent 08/2019, uncontrolled, with complications (hyperosmolar nonketotic state, diastolic CHF, microalbuminuria).  Our first visit was 1.5 months ago.  She just started a diet with only fruit and veggies + fish.  She is feeling better and her sugars are already improved.  Reviewed HbA1c levels: Lab Results  Component Value Date   HGBA1C 9.2 (A) 11/06/2019   HGBA1C 8.5 05/01/2013   HGBA1C 8.8 11/21/2012   HGBA1C 9.8 (H) 05/05/2012   HGBA1C 6.9 (H) 03/31/2010   HGBA1C 7.0 (H) 11/25/2009   HGBA1C 7.1 04/22/2009   HGBA1C 6.8 12/10/2008   HGBA1C 6.8 07/29/2008   HGBA1C 6.8 10/21/2007  07/03/2019: HbA1c 10.9%  At last visit she was on: - Metformin 1000 mg 2x a day, with meals - Amaryl 4 mg 2x a day in am after b'fast and at bedtime - Lantus 40 units at bedtime She was on Victoza ad then Trulicity >> stopped 08/2019 when started insulin.  We changed to: - Metformin 1000 mg 2x a day, with meals - Amaryl 4 mg 2x a day 15-30 min before meals - Trulicity 1.5 mg weekly - Lantus  40 >> 30 units at bedtime  She checks 1x a day and they are: - am: 130, 150-250 >> 110-120 - 2h after b'fast: n/c - before lunch: n/c - 2h after lunch: n/c - before dinner: n/c - 2h after dinner: n/c - bedtime: 300s >> 200s >> 140-190 - nighttime: n/c Lowest sugar was 130 >> 110; she has hypoglycemia awareness at 110.  Highest sugar was 380 >> low 200s: 220.  Glucometer:  ReliOn  No CKD, last BUN/creatinine:  07/03/2019: 10/0.83, GFR 91, glucose 412 Lab Results  Component Value Date   BUN 9 09/25/2016   BUN 13 04/25/2015   CREATININE 0.72 09/25/2016   CREATININE 0.71 04/25/2015  07/03/2019: UA with 3+ glucose and negative protein On lisinopril 10.  + HL; last set of lipids: 07/03/2019: 257/665/41/102 Lab Results  Component Value Date   CHOL 200 05/01/2013   HDL 46 05/01/2013   LDLCALC 78 05/01/2013   TRIG 380 (H) 05/01/2013   CHOLHDL 4.3 05/01/2013  On Lipitor 40, Vascepa 2 g twice a day-decreased due to palpitations.  Of note, she also has a history of transaminitis: 04/01/2020: AST 64 (0-40), ALT 57 (0-32)  - last eye exam was in 05/2019: No DR reportedly  - no numbness and tingling in her feet.  Pt has FH of DM1 in PGM.  She also has HTN, OSA, OA of knees, morbid obesity, elevated LFTs.  ROS: Constitutional: no weight gain/+ weight loss, no fatigue, no subjective hyperthermia, no subjective hypothermia Eyes: no blurry vision, no xerophthalmia ENT: no sore throat, no nodules palpated in neck, no dysphagia, no odynophagia, no hoarseness Cardiovascular: no CP/no SOB/no palpitations/no leg swelling Respiratory: no cough/no SOB/no wheezing Gastrointestinal: no N/no V/no D/no C/no acid reflux Musculoskeletal: + muscle aches (back pain)/no joint aches Skin: no rashes, no hair loss Neurological: no tremors/no numbness/no tingling/no dizziness  I reviewed pt's  medications, allergies, PMH, social hx, family hx, and changes were documented in the history of present illness. Otherwise, unchanged from my initial visit note.   Past Medical History:  Diagnosis Date  . Abdominal pain, unspecified site 08/24/2013  . Diabetes mellitus   . Dyspnea 08/24/2013  . HLD (hyperlipidemia)   . MVP (mitral valve prolapse)   . Sinusitis   . Suppurative hidradenitis 07/27/2013  . SVT (supraventricular tachycardia) ([place]) 09/09/2014   converted with vagal   .  UTI (lower urinary tract infection) 07/27/2013   Past Surgical History:  Procedure Laterality Date  . CESAREAN SECTION    . CESAREAN SECTION WITH BILATERAL TUBAL LIGATION    . CHOLECYSTECTOMY    . HERNIA REPAIR    . UMBILICAL HERNIA REPAIR     Social History   Socioeconomic History  . Marital status: Single    Spouse name: Not on file  . Number of children: 3  . Years of education: Not on file  . Highest education level: Not on file  Occupational History  . Occupation:  Retired  Tobacco Use  . Smoking status: Former Smoker    Years: 15.00    Types: Cigarettes    Quit date: 05/12/2015    Years since quitting: 4.4  . Smokeless tobacco: Never Used  Substance and Sexual Activity  . Alcohol use: No    Alcohol/week: 0.0 standard drinks  . Drug use: No  . Sexual activity: Yes    Birth control/protection: Surgical  Other Topics Concern  . Not on file  Social History Narrative  . Not on file   Social Determinants of Health   Financial Resource Strain:   . Difficulty of Paying Living Expenses:   Food Insecurity:   . Worried About Charity fundraiser in the Last Year:   . Arboriculturist in the Last Year:   Transportation Needs:   . Film/video editor (Medical):   Marland Kitchen Lack of Transportation (Non-Medical):   Physical Activity:   . Days of Exercise per Week:   . Minutes of Exercise per Session:   Stress:   . Feeling of Stress :   Social Connections:   . Frequency of Communication with Friends and Family:   . Frequency of Social Gatherings with Friends and Family:   . Attends Religious Services:   . Active Member of Clubs or Organizations:   . Attends Archivist Meetings:   Marland Kitchen Marital Status:   Intimate Partner Violence:   . Fear of Current or Ex-Partner:   . Emotionally Abused:   Marland Kitchen Physically Abused:   . Sexually Abused:    Current Outpatient Medications on File Prior to Visit  Medication Sig Dispense Refill  . albuterol (PROVENTIL HFA;VENTOLIN HFA) 108  (90 BASE) MCG/ACT inhaler Inhale 2 puffs into the lungs every 6 (six) hours as needed for wheezing or shortness of breath. 18 g 3  . atorvastatin (LIPITOR) 40 MG tablet Take 40 mg by mouth every evening.  0  . Dulaglutide (TRULICITY) 1.5 GM/0.1UU SOPN Inject 1.5 mg into the skin once a week. 4 pen 11  . glimepiride (AMARYL) 4 MG tablet Take 4 mg by mouth 2 (two) times daily.     Marland Kitchen LANTUS SOLOSTAR 100 UNIT/ML Solostar Pen Inject 40 Units into the skin at bedtime. 36 mL 1  . lidocaine (LIDODERM) 5 % Place 1 patch onto the skin daily. Remove & Discard patch within 12 hours or as directed by MD 30 patch 0  .  meloxicam (MOBIC) 15 MG tablet Take 1 tablet by mouth every morning.  0  . metFORMIN (GLUCOPHAGE) 1000 MG tablet Take 1 tablet (1,000 mg total) by mouth 2 (two) times daily with a meal. 60 tablet 0  . metoprolol succinate (TOPROL-XL) 50 MG 24 hr tablet Take 50 mg by mouth daily. Take with or immediately following a meal.     No current facility-administered medications on file prior to visit.   Allergies  Allergen Reactions  . Erythromycin Hives, Itching and Swelling  . Keflex [Cephalexin] Hives, Itching and Swelling  . Doxycycline Hyclate Rash   Family History  Problem Relation Age of Onset  . Diabetes Paternal Grandmother   . Heart disease Paternal Aunt   . Clotting disorder Mother   . Lymphoma Mother   . Stomach cancer Maternal Aunt   . Colon cancer Maternal Aunt   . Prostate cancer Maternal Uncle   . Heart disease Paternal Grandfather   . Coronary artery disease Paternal Grandfather   . Lung cancer Maternal Uncle   + Multiple myeloma in mother  PE: BP 118/80   Pulse 82   Ht '5\' 8"'$  (1.727 m)   Wt 288 lb (130.6 kg)   SpO2 96%   BMI 43.79 kg/m  Wt Readings from Last 3 Encounters:  12/25/19 288 lb (130.6 kg)  12/08/19 289 lb 9.6 oz (131.4 kg)  11/06/19 293 lb (132.9 kg)   Constitutional: overweight, in NAD Eyes: PERRLA, EOMI, no exophthalmos ENT: moist mucous membranes,  no thyromegaly, no cervical lymphadenopathy Cardiovascular: RRR, No MRG Respiratory: CTA B Gastrointestinal: abdomen soft, NT, ND, BS+ Musculoskeletal: no deformities, strength intact in all 4 Skin: moist, warm, no rashes Neurological: no tremor with outstretched hands, DTR normal in all 4  ASSESSMENT: 1. DM2, non-insulin-dependent, uncontrolled, with long-term complications - dCHF - Microalbuminuria  2. HL  3.  Obesity class III  PLAN:  1. Patient with longstanding, uncontrolled, type 2 diabetes, on oral antidiabetic regimen with Metformin and sulfonylurea and also long-acting insulin and now back on the weekly GLP-1 receptor agonist, added at last visit.  HbA1c improved at that time from 10.9% to 9.2% (after the addition of insulin).  At that time, sugars are fluctuating but usually above goal in the morning and at bedtime, when she was checking blood sugars.  We added back Trulicity and I advised her to hold Amaryl 15 to 30 minutes before meals.  I also advised her against fried foods and to switch to almond milk instead of regular milk.  She did see nutrition last month. -At this visit, she decided the diet with only fruit and vegetables and also occasional fish.  She is already feeling better and she was able to reduce the dose of insulin from 40 to 30 units of Lantus.  Reviewing her sugars at home, they are at goal in the morning and mostly at goal later in the day, also.  At this visit, we discussed about reducing the dose of her sulfonylurea while increasing the dose of the GLP-1 receptor agonist.  I advised her that if she does well on a lower dose of glimepiride, she can even stop it completely and only use it as needed for a larger meal in the future. - I suggested to:  Patient Instructions  Please continue: - Metformin 1000 mg 2x a day, with meals - Lantus 30 units daily at bedtime  Please increase:  - Trulicity 3 mg weekly   Decrease: - Amaryl 2 mg 2x a  day 15-30 min before  meals and try to stop it afterwards  Please return in 3 months with your sugar log.   - we checked her HbA1c: 7.2% (much better) - advised to check sugars at different times of the day - 1x a day, rotating check times - advised for yearly eye exams >> she is UTD - return to clinic in 2-3 months  2. HL -Reviewed lipid panel from earlier this year: Triglycerides very high, in the 600s, LDL also above goal -Continues Lipitor and Vascepa without side effects  3.  Obesity class III -We started Trulicity at last visit.  This should also help with weight loss.  At this visit, we will increase the dose. -She saw nutrition 12/08/2019 -She is now doing a plant-based diet plus fish -She lost 5 pounds since last visit  Philemon Kingdom, MD PhD [facility]

## 2019-12-25 NOTE — Patient Instructions (Addendum)
Please continue: - Metformin 1000 mg 2x a day, with meals - Lantus 30 units daily at bedtime  Please increase:  - Trulicity 3 mg weekly   Decrease: - Amaryl 2 mg 2x a day 15-30 min before meals and try to stop it afterwards  Please return in 3 months with your sugar log.

## 2019-12-30 DIAGNOSIS — Z7689 Persons encountering health services in other specified circumstances: Secondary | ICD-10-CM | POA: Diagnosis not present

## 2019-12-30 DIAGNOSIS — Z794 Long term (current) use of insulin: Secondary | ICD-10-CM | POA: Diagnosis not present

## 2019-12-30 DIAGNOSIS — M546 Pain in thoracic spine: Secondary | ICD-10-CM | POA: Diagnosis not present

## 2019-12-30 DIAGNOSIS — E1165 Type 2 diabetes mellitus with hyperglycemia: Secondary | ICD-10-CM | POA: Diagnosis not present

## 2019-12-30 DIAGNOSIS — Z0110 Encounter for examination of ears and hearing without abnormal findings: Secondary | ICD-10-CM | POA: Diagnosis not present

## 2019-12-30 DIAGNOSIS — Z0000 Encounter for general adult medical examination without abnormal findings: Secondary | ICD-10-CM | POA: Diagnosis not present

## 2020-01-05 ENCOUNTER — Encounter: Payer: Medicare Other | Attending: Physician Assistant | Admitting: Skilled Nursing Facility1

## 2020-01-05 DIAGNOSIS — E119 Type 2 diabetes mellitus without complications: Secondary | ICD-10-CM | POA: Insufficient documentation

## 2020-01-06 DIAGNOSIS — G894 Chronic pain syndrome: Secondary | ICD-10-CM | POA: Diagnosis not present

## 2020-01-06 DIAGNOSIS — S83282A Other tear of lateral meniscus, current injury, left knee, initial encounter: Secondary | ICD-10-CM | POA: Diagnosis not present

## 2020-01-06 DIAGNOSIS — M5136 Other intervertebral disc degeneration, lumbar region: Secondary | ICD-10-CM | POA: Diagnosis not present

## 2020-01-06 DIAGNOSIS — M170 Bilateral primary osteoarthritis of knee: Secondary | ICD-10-CM | POA: Diagnosis not present

## 2020-01-11 DIAGNOSIS — M546 Pain in thoracic spine: Secondary | ICD-10-CM | POA: Diagnosis not present

## 2020-01-11 DIAGNOSIS — Z794 Long term (current) use of insulin: Secondary | ICD-10-CM | POA: Diagnosis not present

## 2020-01-11 DIAGNOSIS — J4530 Mild persistent asthma, uncomplicated: Secondary | ICD-10-CM | POA: Diagnosis not present

## 2020-01-11 DIAGNOSIS — E782 Mixed hyperlipidemia: Secondary | ICD-10-CM | POA: Diagnosis not present

## 2020-01-11 DIAGNOSIS — E1165 Type 2 diabetes mellitus with hyperglycemia: Secondary | ICD-10-CM | POA: Diagnosis not present

## 2020-02-05 DIAGNOSIS — M5136 Other intervertebral disc degeneration, lumbar region: Secondary | ICD-10-CM | POA: Diagnosis not present

## 2020-02-05 DIAGNOSIS — M170 Bilateral primary osteoarthritis of knee: Secondary | ICD-10-CM | POA: Diagnosis not present

## 2020-02-05 DIAGNOSIS — G894 Chronic pain syndrome: Secondary | ICD-10-CM | POA: Diagnosis not present

## 2020-02-05 DIAGNOSIS — Z79899 Other long term (current) drug therapy: Secondary | ICD-10-CM | POA: Diagnosis not present

## 2020-02-05 DIAGNOSIS — Z79891 Long term (current) use of opiate analgesic: Secondary | ICD-10-CM | POA: Diagnosis not present

## 2020-02-05 DIAGNOSIS — S83282A Other tear of lateral meniscus, current injury, left knee, initial encounter: Secondary | ICD-10-CM | POA: Diagnosis not present

## 2020-02-09 DIAGNOSIS — M546 Pain in thoracic spine: Secondary | ICD-10-CM | POA: Diagnosis not present

## 2020-02-09 DIAGNOSIS — K5909 Other constipation: Secondary | ICD-10-CM | POA: Diagnosis not present

## 2020-02-09 DIAGNOSIS — E1165 Type 2 diabetes mellitus with hyperglycemia: Secondary | ICD-10-CM | POA: Diagnosis not present

## 2020-02-09 DIAGNOSIS — E782 Mixed hyperlipidemia: Secondary | ICD-10-CM | POA: Diagnosis not present

## 2020-02-09 DIAGNOSIS — Z794 Long term (current) use of insulin: Secondary | ICD-10-CM | POA: Diagnosis not present

## 2020-02-21 DIAGNOSIS — Z7951 Long term (current) use of inhaled steroids: Secondary | ICD-10-CM | POA: Diagnosis not present

## 2020-02-21 DIAGNOSIS — E7800 Pure hypercholesterolemia, unspecified: Secondary | ICD-10-CM | POA: Diagnosis not present

## 2020-02-21 DIAGNOSIS — J45909 Unspecified asthma, uncomplicated: Secondary | ICD-10-CM | POA: Diagnosis not present

## 2020-02-21 DIAGNOSIS — S8392XA Sprain of unspecified site of left knee, initial encounter: Secondary | ICD-10-CM | POA: Diagnosis not present

## 2020-02-21 DIAGNOSIS — M7989 Other specified soft tissue disorders: Secondary | ICD-10-CM | POA: Diagnosis not present

## 2020-02-21 DIAGNOSIS — S93402A Sprain of unspecified ligament of left ankle, initial encounter: Secondary | ICD-10-CM | POA: Diagnosis not present

## 2020-02-21 DIAGNOSIS — M93872 Other specified osteochondropathies, left ankle and foot: Secondary | ICD-10-CM | POA: Diagnosis not present

## 2020-02-21 DIAGNOSIS — R2242 Localized swelling, mass and lump, left lower limb: Secondary | ICD-10-CM | POA: Diagnosis not present

## 2020-02-21 DIAGNOSIS — I10 Essential (primary) hypertension: Secondary | ICD-10-CM | POA: Diagnosis not present

## 2020-02-21 DIAGNOSIS — S99912A Unspecified injury of left ankle, initial encounter: Secondary | ICD-10-CM | POA: Diagnosis not present

## 2020-02-21 DIAGNOSIS — S8992XA Unspecified injury of left lower leg, initial encounter: Secondary | ICD-10-CM | POA: Diagnosis not present

## 2020-02-21 DIAGNOSIS — Z79899 Other long term (current) drug therapy: Secondary | ICD-10-CM | POA: Diagnosis not present

## 2020-02-21 DIAGNOSIS — Z794 Long term (current) use of insulin: Secondary | ICD-10-CM | POA: Diagnosis not present

## 2020-02-21 DIAGNOSIS — E119 Type 2 diabetes mellitus without complications: Secondary | ICD-10-CM | POA: Diagnosis not present

## 2020-02-29 DIAGNOSIS — S83412A Sprain of medial collateral ligament of left knee, initial encounter: Secondary | ICD-10-CM | POA: Diagnosis not present

## 2020-02-29 DIAGNOSIS — S93402A Sprain of unspecified ligament of left ankle, initial encounter: Secondary | ICD-10-CM | POA: Diagnosis not present

## 2020-03-07 ENCOUNTER — Other Ambulatory Visit: Payer: Self-pay | Admitting: Physician Assistant

## 2020-03-07 ENCOUNTER — Ambulatory Visit
Admission: RE | Admit: 2020-03-07 | Discharge: 2020-03-07 | Disposition: A | Discharge status: Home / Self Care | Payer: Medicare Other | Source: Ambulatory Visit | Attending: Physician Assistant | Admitting: Physician Assistant

## 2020-03-07 ENCOUNTER — Other Ambulatory Visit: Payer: Self-pay

## 2020-03-07 DIAGNOSIS — R52 Pain, unspecified: Secondary | ICD-10-CM

## 2020-03-07 DIAGNOSIS — M47816 Spondylosis without myelopathy or radiculopathy, lumbar region: Secondary | ICD-10-CM | POA: Diagnosis not present

## 2020-03-08 DIAGNOSIS — M170 Bilateral primary osteoarthritis of knee: Secondary | ICD-10-CM | POA: Diagnosis not present

## 2020-03-08 DIAGNOSIS — G894 Chronic pain syndrome: Secondary | ICD-10-CM | POA: Diagnosis not present

## 2020-03-08 DIAGNOSIS — M5136 Other intervertebral disc degeneration, lumbar region: Secondary | ICD-10-CM | POA: Diagnosis not present

## 2020-03-08 DIAGNOSIS — S83282A Other tear of lateral meniscus, current injury, left knee, initial encounter: Secondary | ICD-10-CM | POA: Diagnosis not present

## 2020-03-16 DIAGNOSIS — I10 Essential (primary) hypertension: Secondary | ICD-10-CM | POA: Diagnosis not present

## 2020-03-16 DIAGNOSIS — L0292 Furuncle, unspecified: Secondary | ICD-10-CM | POA: Diagnosis not present

## 2020-03-16 DIAGNOSIS — E119 Type 2 diabetes mellitus without complications: Secondary | ICD-10-CM | POA: Diagnosis not present

## 2020-03-16 DIAGNOSIS — Z794 Long term (current) use of insulin: Secondary | ICD-10-CM | POA: Diagnosis not present

## 2020-03-16 DIAGNOSIS — E1165 Type 2 diabetes mellitus with hyperglycemia: Secondary | ICD-10-CM | POA: Diagnosis not present

## 2020-03-16 DIAGNOSIS — R0789 Other chest pain: Secondary | ICD-10-CM | POA: Diagnosis not present

## 2020-03-16 DIAGNOSIS — M546 Pain in thoracic spine: Secondary | ICD-10-CM | POA: Diagnosis not present

## 2020-03-16 DIAGNOSIS — E785 Hyperlipidemia, unspecified: Secondary | ICD-10-CM | POA: Diagnosis not present

## 2020-03-16 DIAGNOSIS — E782 Mixed hyperlipidemia: Secondary | ICD-10-CM | POA: Diagnosis not present

## 2020-03-16 DIAGNOSIS — R0609 Other forms of dyspnea: Secondary | ICD-10-CM | POA: Diagnosis not present

## 2020-03-17 DIAGNOSIS — S93402A Sprain of unspecified ligament of left ankle, initial encounter: Secondary | ICD-10-CM | POA: Diagnosis not present

## 2020-03-17 DIAGNOSIS — S83412A Sprain of medial collateral ligament of left knee, initial encounter: Secondary | ICD-10-CM | POA: Diagnosis not present

## 2020-03-22 DIAGNOSIS — R945 Abnormal results of liver function studies: Secondary | ICD-10-CM | POA: Diagnosis not present

## 2020-03-22 DIAGNOSIS — K7400 Hepatic fibrosis, unspecified: Secondary | ICD-10-CM | POA: Diagnosis not present

## 2020-03-22 DIAGNOSIS — K760 Fatty (change of) liver, not elsewhere classified: Secondary | ICD-10-CM | POA: Diagnosis not present

## 2020-03-25 DIAGNOSIS — M25562 Pain in left knee: Secondary | ICD-10-CM | POA: Diagnosis not present

## 2020-03-25 DIAGNOSIS — M25572 Pain in left ankle and joints of left foot: Secondary | ICD-10-CM | POA: Diagnosis not present

## 2020-04-01 DIAGNOSIS — M25562 Pain in left knee: Secondary | ICD-10-CM | POA: Diagnosis not present

## 2020-04-01 DIAGNOSIS — M25572 Pain in left ankle and joints of left foot: Secondary | ICD-10-CM | POA: Diagnosis not present

## 2020-04-07 DIAGNOSIS — M25562 Pain in left knee: Secondary | ICD-10-CM | POA: Diagnosis not present

## 2020-04-07 DIAGNOSIS — M25572 Pain in left ankle and joints of left foot: Secondary | ICD-10-CM | POA: Diagnosis not present

## 2020-04-12 DIAGNOSIS — M5459 Other low back pain: Secondary | ICD-10-CM | POA: Diagnosis not present

## 2020-04-12 DIAGNOSIS — M5136 Other intervertebral disc degeneration, lumbar region: Secondary | ICD-10-CM | POA: Diagnosis not present

## 2020-04-12 DIAGNOSIS — M170 Bilateral primary osteoarthritis of knee: Secondary | ICD-10-CM | POA: Diagnosis not present

## 2020-04-12 DIAGNOSIS — Z79891 Long term (current) use of opiate analgesic: Secondary | ICD-10-CM | POA: Diagnosis not present

## 2020-04-12 DIAGNOSIS — G894 Chronic pain syndrome: Secondary | ICD-10-CM | POA: Diagnosis not present

## 2020-04-12 DIAGNOSIS — Z79899 Other long term (current) drug therapy: Secondary | ICD-10-CM | POA: Diagnosis not present

## 2020-04-13 DIAGNOSIS — M25562 Pain in left knee: Secondary | ICD-10-CM | POA: Diagnosis not present

## 2020-04-13 DIAGNOSIS — M25572 Pain in left ankle and joints of left foot: Secondary | ICD-10-CM | POA: Diagnosis not present

## 2020-04-14 DIAGNOSIS — M25562 Pain in left knee: Secondary | ICD-10-CM | POA: Diagnosis not present

## 2020-04-14 DIAGNOSIS — M25572 Pain in left ankle and joints of left foot: Secondary | ICD-10-CM | POA: Diagnosis not present

## 2020-04-21 DIAGNOSIS — M25572 Pain in left ankle and joints of left foot: Secondary | ICD-10-CM | POA: Diagnosis not present

## 2020-04-21 DIAGNOSIS — M25562 Pain in left knee: Secondary | ICD-10-CM | POA: Diagnosis not present

## 2020-04-28 DIAGNOSIS — R051 Acute cough: Secondary | ICD-10-CM | POA: Diagnosis not present

## 2020-04-28 DIAGNOSIS — U071 COVID-19: Secondary | ICD-10-CM | POA: Diagnosis not present

## 2020-04-28 DIAGNOSIS — J069 Acute upper respiratory infection, unspecified: Secondary | ICD-10-CM | POA: Diagnosis not present

## 2020-04-29 DIAGNOSIS — U071 COVID-19: Secondary | ICD-10-CM | POA: Diagnosis not present

## 2020-04-29 DIAGNOSIS — E782 Mixed hyperlipidemia: Secondary | ICD-10-CM | POA: Diagnosis not present

## 2020-04-29 DIAGNOSIS — M546 Pain in thoracic spine: Secondary | ICD-10-CM | POA: Diagnosis not present

## 2020-04-29 DIAGNOSIS — Z794 Long term (current) use of insulin: Secondary | ICD-10-CM | POA: Diagnosis not present

## 2020-04-29 DIAGNOSIS — E1165 Type 2 diabetes mellitus with hyperglycemia: Secondary | ICD-10-CM | POA: Diagnosis not present

## 2020-05-12 DIAGNOSIS — M546 Pain in thoracic spine: Secondary | ICD-10-CM | POA: Diagnosis not present

## 2020-05-12 DIAGNOSIS — E782 Mixed hyperlipidemia: Secondary | ICD-10-CM | POA: Diagnosis not present

## 2020-05-12 DIAGNOSIS — Z794 Long term (current) use of insulin: Secondary | ICD-10-CM | POA: Diagnosis not present

## 2020-05-12 DIAGNOSIS — G894 Chronic pain syndrome: Secondary | ICD-10-CM | POA: Diagnosis not present

## 2020-05-12 DIAGNOSIS — M5136 Other intervertebral disc degeneration, lumbar region: Secondary | ICD-10-CM | POA: Diagnosis not present

## 2020-05-12 DIAGNOSIS — E1165 Type 2 diabetes mellitus with hyperglycemia: Secondary | ICD-10-CM | POA: Diagnosis not present

## 2020-05-12 DIAGNOSIS — S83282A Other tear of lateral meniscus, current injury, left knee, initial encounter: Secondary | ICD-10-CM | POA: Diagnosis not present

## 2020-05-12 DIAGNOSIS — M170 Bilateral primary osteoarthritis of knee: Secondary | ICD-10-CM | POA: Diagnosis not present

## 2020-05-12 DIAGNOSIS — R059 Cough, unspecified: Secondary | ICD-10-CM | POA: Diagnosis not present

## 2020-05-19 DIAGNOSIS — M25572 Pain in left ankle and joints of left foot: Secondary | ICD-10-CM | POA: Diagnosis not present

## 2020-05-19 DIAGNOSIS — M25562 Pain in left knee: Secondary | ICD-10-CM | POA: Diagnosis not present

## 2020-05-26 DIAGNOSIS — M1712 Unilateral primary osteoarthritis, left knee: Secondary | ICD-10-CM | POA: Diagnosis not present

## 2020-05-26 DIAGNOSIS — R52 Pain, unspecified: Secondary | ICD-10-CM | POA: Diagnosis not present

## 2020-05-26 DIAGNOSIS — M25562 Pain in left knee: Secondary | ICD-10-CM | POA: Diagnosis not present

## 2020-05-26 DIAGNOSIS — M25572 Pain in left ankle and joints of left foot: Secondary | ICD-10-CM | POA: Diagnosis not present

## 2020-06-09 DIAGNOSIS — R0789 Other chest pain: Secondary | ICD-10-CM | POA: Diagnosis not present

## 2020-06-09 DIAGNOSIS — E785 Hyperlipidemia, unspecified: Secondary | ICD-10-CM | POA: Diagnosis not present

## 2020-06-09 DIAGNOSIS — I10 Essential (primary) hypertension: Secondary | ICD-10-CM | POA: Diagnosis not present

## 2020-06-09 DIAGNOSIS — E119 Type 2 diabetes mellitus without complications: Secondary | ICD-10-CM | POA: Diagnosis not present

## 2020-06-09 DIAGNOSIS — R0609 Other forms of dyspnea: Secondary | ICD-10-CM | POA: Diagnosis not present

## 2020-06-16 DIAGNOSIS — M5136 Other intervertebral disc degeneration, lumbar region: Secondary | ICD-10-CM | POA: Diagnosis not present

## 2020-06-16 DIAGNOSIS — S83282A Other tear of lateral meniscus, current injury, left knee, initial encounter: Secondary | ICD-10-CM | POA: Diagnosis not present

## 2020-06-16 DIAGNOSIS — G894 Chronic pain syndrome: Secondary | ICD-10-CM | POA: Diagnosis not present

## 2020-06-16 DIAGNOSIS — M170 Bilateral primary osteoarthritis of knee: Secondary | ICD-10-CM | POA: Diagnosis not present

## 2020-06-22 DIAGNOSIS — M179 Osteoarthritis of knee, unspecified: Secondary | ICD-10-CM | POA: Insufficient documentation

## 2020-06-22 DIAGNOSIS — M1710 Unilateral primary osteoarthritis, unspecified knee: Secondary | ICD-10-CM | POA: Insufficient documentation

## 2020-06-22 DIAGNOSIS — M25562 Pain in left knee: Secondary | ICD-10-CM | POA: Diagnosis not present

## 2020-06-22 DIAGNOSIS — M1712 Unilateral primary osteoarthritis, left knee: Secondary | ICD-10-CM | POA: Diagnosis not present

## 2020-06-22 DIAGNOSIS — M25572 Pain in left ankle and joints of left foot: Secondary | ICD-10-CM | POA: Diagnosis not present

## 2020-07-07 DIAGNOSIS — Z1231 Encounter for screening mammogram for malignant neoplasm of breast: Secondary | ICD-10-CM | POA: Diagnosis not present

## 2020-07-14 DIAGNOSIS — Z79891 Long term (current) use of opiate analgesic: Secondary | ICD-10-CM | POA: Diagnosis not present

## 2020-07-14 DIAGNOSIS — Z79899 Other long term (current) drug therapy: Secondary | ICD-10-CM | POA: Diagnosis not present

## 2020-07-14 DIAGNOSIS — G894 Chronic pain syndrome: Secondary | ICD-10-CM | POA: Diagnosis not present

## 2020-07-14 DIAGNOSIS — M722 Plantar fascial fibromatosis: Secondary | ICD-10-CM | POA: Diagnosis not present

## 2020-07-14 DIAGNOSIS — M5136 Other intervertebral disc degeneration, lumbar region: Secondary | ICD-10-CM | POA: Diagnosis not present

## 2020-07-14 DIAGNOSIS — M170 Bilateral primary osteoarthritis of knee: Secondary | ICD-10-CM | POA: Diagnosis not present

## 2020-07-20 DIAGNOSIS — G4733 Obstructive sleep apnea (adult) (pediatric): Secondary | ICD-10-CM | POA: Diagnosis not present

## 2020-07-20 DIAGNOSIS — Z0000 Encounter for general adult medical examination without abnormal findings: Secondary | ICD-10-CM | POA: Diagnosis not present

## 2020-07-20 DIAGNOSIS — J4530 Mild persistent asthma, uncomplicated: Secondary | ICD-10-CM | POA: Diagnosis not present

## 2020-07-20 DIAGNOSIS — E782 Mixed hyperlipidemia: Secondary | ICD-10-CM | POA: Diagnosis not present

## 2020-07-20 DIAGNOSIS — E1165 Type 2 diabetes mellitus with hyperglycemia: Secondary | ICD-10-CM | POA: Diagnosis not present

## 2020-07-20 DIAGNOSIS — M546 Pain in thoracic spine: Secondary | ICD-10-CM | POA: Diagnosis not present

## 2020-07-20 DIAGNOSIS — Z794 Long term (current) use of insulin: Secondary | ICD-10-CM | POA: Diagnosis not present

## 2020-07-20 DIAGNOSIS — Z87891 Personal history of nicotine dependence: Secondary | ICD-10-CM | POA: Diagnosis not present

## 2020-07-20 DIAGNOSIS — K5909 Other constipation: Secondary | ICD-10-CM | POA: Diagnosis not present

## 2020-07-20 DIAGNOSIS — I10 Essential (primary) hypertension: Secondary | ICD-10-CM | POA: Diagnosis not present

## 2020-08-02 ENCOUNTER — Other Ambulatory Visit: Payer: Self-pay

## 2020-08-02 ENCOUNTER — Ambulatory Visit (INDEPENDENT_AMBULATORY_CARE_PROVIDER_SITE_OTHER): Payer: Medicare Other | Admitting: Podiatry

## 2020-08-02 ENCOUNTER — Telehealth: Payer: Self-pay

## 2020-08-02 ENCOUNTER — Encounter: Payer: Self-pay | Admitting: Podiatry

## 2020-08-02 ENCOUNTER — Ambulatory Visit (INDEPENDENT_AMBULATORY_CARE_PROVIDER_SITE_OTHER): Payer: Medicare Other

## 2020-08-02 DIAGNOSIS — B351 Tinea unguium: Secondary | ICD-10-CM | POA: Diagnosis not present

## 2020-08-02 DIAGNOSIS — W172XXS Fall into hole, sequela: Secondary | ICD-10-CM

## 2020-08-02 DIAGNOSIS — M79675 Pain in left toe(s): Secondary | ICD-10-CM | POA: Diagnosis not present

## 2020-08-02 DIAGNOSIS — M7752 Other enthesopathy of left foot: Secondary | ICD-10-CM

## 2020-08-02 DIAGNOSIS — M779 Enthesopathy, unspecified: Secondary | ICD-10-CM

## 2020-08-02 DIAGNOSIS — E1165 Type 2 diabetes mellitus with hyperglycemia: Secondary | ICD-10-CM

## 2020-08-02 DIAGNOSIS — M79674 Pain in right toe(s): Secondary | ICD-10-CM

## 2020-08-02 DIAGNOSIS — M7750 Other enthesopathy of unspecified foot: Secondary | ICD-10-CM

## 2020-08-02 MED ORDER — NONFORMULARY OR COMPOUNDED ITEM: 3 refills | Status: DC

## 2020-08-02 NOTE — Telephone Encounter (Signed)
Prescription for anti-fungal solution faxed to [facility] 

## 2020-08-06 NOTE — Progress Notes (Signed)
Subjective:   Patient ID: Breanna Berger, female   DOB: 61 y.o.   MRN: [id number]   HPI 60 year old female presents the office today for diabetic foot exam as well as for thick, discolored toenails that she cannot trim her self.  Her last A1c was up to 11 from a 7.2 she reports.  She had a fall back in September she was in seeing orthopedics for her left knee and ankle.  She discussed the pain management.  She states that she fell in a hole at nighttime on September 2021.  She did follow-up with orthopedics.  She was told she has a torn meniscus in her knee as well as stretched tendons in her ankle.  Her main concern is she has pain to the top of her foot she is requesting a CT scan.   Review of Systems  All other systems reviewed and are negative.  Past Medical History:  Diagnosis Date  . Abdominal pain, unspecified site 08/24/2013  . Diabetes mellitus   . Dyspnea 08/24/2013  . HLD (hyperlipidemia)   . MVP (mitral valve prolapse)   . Sinusitis   . Suppurative hidradenitis 07/27/2013  . SVT (supraventricular tachycardia) ([place]) 09/09/2014   converted with vagal   . UTI (lower urinary tract infection) 07/27/2013    Past Surgical History:  Procedure Laterality Date  . CESAREAN SECTION    . CESAREAN SECTION WITH BILATERAL TUBAL LIGATION    . CHOLECYSTECTOMY    . HERNIA REPAIR    . UMBILICAL HERNIA REPAIR       Current Outpatient Medications:  .  Cholecalciferol 25 MCG (1000 UT) capsule, Take by mouth., Disp: , Rfl:  .  fluticasone (FLONASE) 50 MCG/ACT nasal spray, Place into the nose., Disp: , Rfl:  .  Fluticasone-Salmeterol (ADVAIR) 100-50 MCG/DOSE AEPB, INHALE 1 DOSE BY MOUTH TWICE DAILY, Disp: , Rfl:  .  furosemide (LASIX) 20 MG tablet, Take by mouth., Disp: , Rfl:  .  NONFORMULARY OR COMPOUNDED ITEM, Antifungal solution: Terbinafine 3%, Fluconazole 2%, Tea Tree Oil 5%, Urea 10%, Ibuprofen 2% in DMSO suspension #44mL, Disp: 1 each, Rfl: 3 .  tiZANidine (ZANAFLEX) 4 MG tablet,  Take by mouth., Disp: , Rfl:  .  albuterol (PROVENTIL HFA;VENTOLIN HFA) 108 (90 BASE) MCG/ACT inhaler, Inhale 2 puffs into the lungs every 6 (six) hours as needed for wheezing or shortness of breath., Disp: 18 g, Rfl: 3 .  aspirin 81 MG chewable tablet, aspirin 81 mg chewable tablet, Disp: , Rfl:  .  atorvastatin (LIPITOR) 40 MG tablet, Take 40 mg by mouth every evening., Disp: , Rfl: 0 .  azithromycin (ZITHROMAX) 250 MG tablet, TAKE 2 TABLETS BY MOUTH ON DAY 1, AND THEN TAKE 1 TABLET BY MOUTH ONCE A DAY ON DAY 2 THROUGH DAY 5, Disp: , Rfl:  .  Blood Glucose Monitoring Suppl (ACCU-CHEK GUIDE ME) w/Device KIT, Use to check blood sugar 3 times a day, Disp: 1 kit, Rfl: 0 .  cephALEXin (KEFLEX) 500 MG capsule, cephalexin 500 mg capsule, Disp: , Rfl:  .  cyclobenzaprine (FLEXERIL) 5 MG tablet, cyclobenzaprine 5 mg tablet  TAKE 1 TO 2 TABLETS BY MOUTH THREE TIMES DAILY AS NEEDED FOR 10 DAYS. STOP GLIMEPERIDE., Disp: , Rfl:  .  diazepam (VALIUM) 10 MG tablet, diazepam 10 mg tablet, Disp: , Rfl:  .  diclofenac Sodium (VOLTAREN) 1 % GEL, APPLY 4 GRAMS TO THE AFFECTED AREA BY TOPICAL ROUTE 4 TIMES PER DAY, Disp: , Rfl:  .  Dulaglutide (TRULICITY) 3  MG/0.5ML SOPN, Inject 0.5 mLs (3 mg total) into the skin once a week., Disp: 12 pen, Rfl: 3 .  fluconazole (DIFLUCAN) 150 MG tablet, Take 150 mg by mouth once., Disp: , Rfl:  .  gabapentin (NEURONTIN) 300 MG capsule, gabapentin 300 mg capsule, Disp: , Rfl:  .  glimepiride (AMARYL) 4 MG tablet, Take 2 mg by mouth 2 (two) times daily., Disp: , Rfl:  .  glipiZIDE (GLUCOTROL XL) 10 MG 24 hr tablet, glipizide ER 10 mg tablet, extended release 24 hr  TAKE 1 TABLET BY MOUTH ONCE DAILY, Disp: , Rfl:  .  glucose blood (ACCU-CHEK GUIDE) test strip, Use to check blood sugar 3 times a day., Disp: 300 each, Rfl: 12 .  HYDROcodone-acetaminophen (NORCO) 10-325 MG tablet, TAKE 1 TABLET BY MOUTH UP TO FIVE TIMES DAILY IF NEEDED, Disp: , Rfl:  .  HYDROcodone-acetaminophen (NORCO)  7.5-325 MG tablet, hydrocodone 7.5 mg-acetaminophen 325 mg tablet  TAKE 1 TABLET BY MOUTH EVERY 8 HOURS AS NEEDED, Disp: , Rfl:  .  ibuprofen (ADVIL) 800 MG tablet, Take 800 mg by mouth 3 (three) times daily., Disp: , Rfl:  .  icosapent Ethyl (VASCEPA) 1 g capsule, icosapent ethyl 1 gram capsule  TAKE 2 CAPSULES BY MOUTH TWICE DAILY FOR 30 DAYS, Disp: , Rfl:  .  LANTUS SOLOSTAR 100 UNIT/ML Solostar Pen, Inject 30 Units into the skin at bedtime., Disp: 5 pen, Rfl: 3 .  lidocaine (LIDODERM) 5 %, Place 1 patch onto the skin daily. Remove & Discard patch within 12 hours or as directed by MD, Disp: 30 patch, Rfl: 0 .  LINZESS 290 MCG CAPS capsule, Take 290 mcg by mouth daily., Disp: , Rfl:  .  liraglutide (VICTOZA) 18 MG/3ML SOPN, Victoza 3-Pak 0.6 mg/0.1 mL (18 mg/3 mL) subcutaneous pen injector, Disp: , Rfl:  .  meloxicam (MOBIC) 15 MG tablet, Take 1 tablet by mouth every morning., Disp: , Rfl: 0 .  metFORMIN (GLUCOPHAGE) 1000 MG tablet, Take 1 tablet (1,000 mg total) by mouth 2 (two) times daily with a meal., Disp: 60 tablet, Rfl: 0 .  metoprolol succinate (TOPROL-XL) 50 MG 24 hr tablet, Take 50 mg by mouth daily. Take with or immediately following a meal., Disp: , Rfl:  .  montelukast (SINGULAIR) 10 MG tablet, montelukast 10 mg tablet  TAKE 1 TABLET BY MOUTH ONCE DAILY FOR 30 DAYS, Disp: , Rfl:  .  nitroGLYCERIN (NITROSTAT) 0.4 MG SL tablet, SMARTSIG:1 Sublingual Every Night, Disp: , Rfl:  .  Olopatadine HCl 0.2 % SOLN, olopatadine 0.2 % eye drops, Disp: , Rfl:  .  oseltamivir (TAMIFLU) 75 MG capsule, oseltamivir 75 mg capsule, Disp: , Rfl:  .  promethazine-dextromethorphan (PROMETHAZINE-DM) 6.25-15 MG/5ML syrup, promethazine-DM 6.25 mg-15 mg/5 mL oral syrup, Disp: , Rfl:  .  RELION PEN NEEDLES 32G X 4 MM MISC, , Disp: , Rfl:  .  spironolactone (ALDACTONE) 50 MG tablet, spironolactone 50 mg tablet, Disp: , Rfl:   Allergies  Allergen Reactions  . Erythromycin Hives, Itching and Swelling  . Keflex  [Cephalexin] Hives, Itching and Swelling  . Doxycycline Hyclate Rash    Objective:  Physical Exam  General: AAO x3, NAD  Dermatological: Nails are hypertrophic, dystrophic, brittle, discolored, elongated 10. No surrounding redness or drainage. Tenderness nails 1-5 bilaterally. No open lesions or pre-ulcerative lesions are identified today.  Vascular: Dorsalis Pedis artery and Posterior Tibial artery pedal pulses are 2/4 bilateral with immedate capillary fill time.  There is no pain with calf compression,  swelling, warmth, erythema.   Neruologic: Grossly intact via light touch bilateral.  Sensation intact with Semmes Weinstein monofilament.  Musculoskeletal: Majority discomfort on the left foot mostly on the fourth metatarsal shaft area.  There is no significant edema there is no erythema or warmth associate with this.  Flexor, extensor tendons appear to be intact.  Mild discomfort of the ankle but the majority of tenderness first down the foot.  Assessment:   Symptomatic onychomycosis, left foot pain requesting CT scan     Plan:  -Treatment options discussed including all alternatives, risks, and complications -Etiology of symptoms were discussed  1.  Onychomycosis, symptomatic -Sharply debrided the nails with any complications or bleeding.  Order compound cream today through [place] for the toenails.  2.  Chronic left foot pain status post fall -She is under the care of orthopedics that she will bring the records in for me but she is not actively seeing them.  Will order CT scan of the left foot.  3.  Uncontrolled type 2 diabetes -Encourage glucose control.  Monitor for any ulcerations and daily foot inspection discussed.

## 2020-08-10 DIAGNOSIS — Z794 Long term (current) use of insulin: Secondary | ICD-10-CM | POA: Diagnosis not present

## 2020-08-10 DIAGNOSIS — M546 Pain in thoracic spine: Secondary | ICD-10-CM | POA: Diagnosis not present

## 2020-08-10 DIAGNOSIS — E1165 Type 2 diabetes mellitus with hyperglycemia: Secondary | ICD-10-CM | POA: Diagnosis not present

## 2020-08-10 DIAGNOSIS — Z87891 Personal history of nicotine dependence: Secondary | ICD-10-CM | POA: Diagnosis not present

## 2020-08-10 DIAGNOSIS — K5909 Other constipation: Secondary | ICD-10-CM | POA: Diagnosis not present

## 2020-08-10 DIAGNOSIS — I10 Essential (primary) hypertension: Secondary | ICD-10-CM | POA: Diagnosis not present

## 2020-08-10 DIAGNOSIS — E782 Mixed hyperlipidemia: Secondary | ICD-10-CM | POA: Diagnosis not present

## 2020-08-10 DIAGNOSIS — J4530 Mild persistent asthma, uncomplicated: Secondary | ICD-10-CM | POA: Diagnosis not present

## 2020-08-10 DIAGNOSIS — G4733 Obstructive sleep apnea (adult) (pediatric): Secondary | ICD-10-CM | POA: Diagnosis not present

## 2020-08-11 DIAGNOSIS — G894 Chronic pain syndrome: Secondary | ICD-10-CM | POA: Diagnosis not present

## 2020-08-11 DIAGNOSIS — M170 Bilateral primary osteoarthritis of knee: Secondary | ICD-10-CM | POA: Diagnosis not present

## 2020-08-11 DIAGNOSIS — M5136 Other intervertebral disc degeneration, lumbar region: Secondary | ICD-10-CM | POA: Diagnosis not present

## 2020-08-11 DIAGNOSIS — M47816 Spondylosis without myelopathy or radiculopathy, lumbar region: Secondary | ICD-10-CM | POA: Diagnosis not present

## 2020-08-13 ENCOUNTER — Other Ambulatory Visit: Payer: Medicare Other

## 2020-08-24 DIAGNOSIS — G4733 Obstructive sleep apnea (adult) (pediatric): Secondary | ICD-10-CM | POA: Diagnosis not present

## 2020-08-24 DIAGNOSIS — Z794 Long term (current) use of insulin: Secondary | ICD-10-CM | POA: Diagnosis not present

## 2020-08-24 DIAGNOSIS — E1165 Type 2 diabetes mellitus with hyperglycemia: Secondary | ICD-10-CM | POA: Diagnosis not present

## 2020-08-24 DIAGNOSIS — Z87891 Personal history of nicotine dependence: Secondary | ICD-10-CM | POA: Diagnosis not present

## 2020-08-24 DIAGNOSIS — E782 Mixed hyperlipidemia: Secondary | ICD-10-CM | POA: Diagnosis not present

## 2020-08-24 DIAGNOSIS — K5909 Other constipation: Secondary | ICD-10-CM | POA: Diagnosis not present

## 2020-08-24 DIAGNOSIS — I10 Essential (primary) hypertension: Secondary | ICD-10-CM | POA: Diagnosis not present

## 2020-08-24 DIAGNOSIS — M546 Pain in thoracic spine: Secondary | ICD-10-CM | POA: Diagnosis not present

## 2020-08-24 DIAGNOSIS — J4530 Mild persistent asthma, uncomplicated: Secondary | ICD-10-CM | POA: Diagnosis not present

## 2020-09-14 DIAGNOSIS — M5136 Other intervertebral disc degeneration, lumbar region: Secondary | ICD-10-CM | POA: Diagnosis not present

## 2020-09-14 DIAGNOSIS — M170 Bilateral primary osteoarthritis of knee: Secondary | ICD-10-CM | POA: Diagnosis not present

## 2020-09-14 DIAGNOSIS — G894 Chronic pain syndrome: Secondary | ICD-10-CM | POA: Diagnosis not present

## 2020-09-14 DIAGNOSIS — M722 Plantar fascial fibromatosis: Secondary | ICD-10-CM | POA: Diagnosis not present

## 2020-09-27 DIAGNOSIS — Z794 Long term (current) use of insulin: Secondary | ICD-10-CM | POA: Diagnosis not present

## 2020-09-27 DIAGNOSIS — E1165 Type 2 diabetes mellitus with hyperglycemia: Secondary | ICD-10-CM | POA: Diagnosis not present

## 2020-09-27 DIAGNOSIS — K5909 Other constipation: Secondary | ICD-10-CM | POA: Diagnosis not present

## 2020-09-27 DIAGNOSIS — M546 Pain in thoracic spine: Secondary | ICD-10-CM | POA: Diagnosis not present

## 2020-09-27 DIAGNOSIS — Z87891 Personal history of nicotine dependence: Secondary | ICD-10-CM | POA: Diagnosis not present

## 2020-09-27 DIAGNOSIS — R059 Cough, unspecified: Secondary | ICD-10-CM | POA: Diagnosis not present

## 2020-09-27 DIAGNOSIS — G4733 Obstructive sleep apnea (adult) (pediatric): Secondary | ICD-10-CM | POA: Diagnosis not present

## 2020-09-27 DIAGNOSIS — I10 Essential (primary) hypertension: Secondary | ICD-10-CM | POA: Diagnosis not present

## 2020-09-27 DIAGNOSIS — E782 Mixed hyperlipidemia: Secondary | ICD-10-CM | POA: Diagnosis not present

## 2020-09-27 DIAGNOSIS — J4530 Mild persistent asthma, uncomplicated: Secondary | ICD-10-CM | POA: Diagnosis not present

## 2020-09-27 DIAGNOSIS — J40 Bronchitis, not specified as acute or chronic: Secondary | ICD-10-CM | POA: Diagnosis not present

## 2020-10-05 DIAGNOSIS — K7400 Hepatic fibrosis, unspecified: Secondary | ICD-10-CM | POA: Diagnosis not present

## 2020-10-05 DIAGNOSIS — Z7901 Long term (current) use of anticoagulants: Secondary | ICD-10-CM | POA: Diagnosis not present

## 2020-10-10 DIAGNOSIS — E785 Hyperlipidemia, unspecified: Secondary | ICD-10-CM | POA: Diagnosis not present

## 2020-10-10 DIAGNOSIS — I10 Essential (primary) hypertension: Secondary | ICD-10-CM | POA: Diagnosis not present

## 2020-10-10 DIAGNOSIS — E119 Type 2 diabetes mellitus without complications: Secondary | ICD-10-CM | POA: Diagnosis not present

## 2020-10-11 DIAGNOSIS — E119 Type 2 diabetes mellitus without complications: Secondary | ICD-10-CM | POA: Diagnosis not present

## 2020-10-11 DIAGNOSIS — I10 Essential (primary) hypertension: Secondary | ICD-10-CM | POA: Diagnosis not present

## 2020-10-11 DIAGNOSIS — E785 Hyperlipidemia, unspecified: Secondary | ICD-10-CM | POA: Diagnosis not present

## 2020-10-20 DIAGNOSIS — K7400 Hepatic fibrosis, unspecified: Secondary | ICD-10-CM | POA: Diagnosis not present

## 2020-10-24 DIAGNOSIS — Z794 Long term (current) use of insulin: Secondary | ICD-10-CM | POA: Diagnosis not present

## 2020-10-24 DIAGNOSIS — Z87891 Personal history of nicotine dependence: Secondary | ICD-10-CM | POA: Diagnosis not present

## 2020-10-24 DIAGNOSIS — K5909 Other constipation: Secondary | ICD-10-CM | POA: Diagnosis not present

## 2020-10-24 DIAGNOSIS — E1165 Type 2 diabetes mellitus with hyperglycemia: Secondary | ICD-10-CM | POA: Diagnosis not present

## 2020-10-24 DIAGNOSIS — J4530 Mild persistent asthma, uncomplicated: Secondary | ICD-10-CM | POA: Diagnosis not present

## 2020-10-24 DIAGNOSIS — G4733 Obstructive sleep apnea (adult) (pediatric): Secondary | ICD-10-CM | POA: Diagnosis not present

## 2020-10-24 DIAGNOSIS — I10 Essential (primary) hypertension: Secondary | ICD-10-CM | POA: Diagnosis not present

## 2020-10-24 DIAGNOSIS — E782 Mixed hyperlipidemia: Secondary | ICD-10-CM | POA: Diagnosis not present

## 2020-10-27 DIAGNOSIS — G894 Chronic pain syndrome: Secondary | ICD-10-CM | POA: Diagnosis not present

## 2020-10-27 DIAGNOSIS — M722 Plantar fascial fibromatosis: Secondary | ICD-10-CM | POA: Diagnosis not present

## 2020-10-27 DIAGNOSIS — M5136 Other intervertebral disc degeneration, lumbar region: Secondary | ICD-10-CM | POA: Diagnosis not present

## 2020-10-27 DIAGNOSIS — M170 Bilateral primary osteoarthritis of knee: Secondary | ICD-10-CM | POA: Diagnosis not present

## 2020-10-31 DIAGNOSIS — Z794 Long term (current) use of insulin: Secondary | ICD-10-CM | POA: Diagnosis not present

## 2020-10-31 DIAGNOSIS — E119 Type 2 diabetes mellitus without complications: Secondary | ICD-10-CM | POA: Diagnosis not present

## 2020-11-09 ENCOUNTER — Ambulatory Visit: Payer: Medicare Other | Admitting: Podiatry

## 2020-11-16 ENCOUNTER — Other Ambulatory Visit: Payer: Medicare Other

## 2020-11-22 ENCOUNTER — Other Ambulatory Visit: Payer: Self-pay

## 2020-11-22 ENCOUNTER — Ambulatory Visit: Payer: Medicare Other | Admitting: Podiatry

## 2020-11-22 ENCOUNTER — Encounter: Payer: Self-pay | Admitting: Podiatry

## 2020-11-22 DIAGNOSIS — R1310 Dysphagia, unspecified: Secondary | ICD-10-CM | POA: Insufficient documentation

## 2020-11-22 DIAGNOSIS — W172XXS Fall into hole, sequela: Secondary | ICD-10-CM | POA: Diagnosis not present

## 2020-11-22 DIAGNOSIS — K5730 Diverticulosis of large intestine without perforation or abscess without bleeding: Secondary | ICD-10-CM | POA: Insufficient documentation

## 2020-11-22 DIAGNOSIS — M779 Enthesopathy, unspecified: Secondary | ICD-10-CM | POA: Diagnosis not present

## 2020-11-22 DIAGNOSIS — K760 Fatty (change of) liver, not elsewhere classified: Secondary | ICD-10-CM | POA: Insufficient documentation

## 2020-11-22 DIAGNOSIS — R194 Change in bowel habit: Secondary | ICD-10-CM | POA: Insufficient documentation

## 2020-11-22 DIAGNOSIS — K644 Residual hemorrhoidal skin tags: Secondary | ICD-10-CM | POA: Insufficient documentation

## 2020-11-22 DIAGNOSIS — K5904 Chronic idiopathic constipation: Secondary | ICD-10-CM | POA: Insufficient documentation

## 2020-11-25 NOTE — Progress Notes (Signed)
Subjective: 60 year old female presents the office today for concerns of continued pain to her left foot.  She had a fall in September 2021 and she had been seen orthopedics for her knee as well as her ankle.  She has not followed up with orthopedics recently.  She presents today for continued pain of her left foot since the injury.  She brings a copy of her MRI for the left ankle.  I previously ordered a CT scan of the left foot but she did not get this. Denies any systemic complaints such as fevers, chills, nausea, vomiting. No acute changes since last appointment, and no other complaints at this time.   Last A1c that I see is 7.2 on 09/25/2019  Objective: AAO x3, NAD DP/PT pulses palpable bilaterally, CRT less than 3 seconds There is tenderness with palpation on the dorsal aspect of the midfoot, Lisfranc joint left side.  There is no sign of edema there is no erythema or warmth.  Flexor, extensor tendons appear intact.  No significant pain of the ankle today.  MMT 5/5. No pain with calf compression, swelling, warmth, erythema  Assessment: Left chronic foot pain s/p injury  Plan: -All treatment options discussed with the patient including all alternatives, risks, complications.  -Again reviewed the x-rays with her.  No subacute fracture.  Given her ongoing symptoms and would order an MRI of the left foot.  Now continue stiffer soled shoe, arch supports.  Previous MRI of the left ankle performed by [facility] revealed mild peroneal tenosynovitis without tear, thickened calcaneofibular ligament from prior strain, mild chronic plantar fasciitis. -Patient encouraged to call the office with any questions, concerns, change in symptoms.   Trula Slade DPM

## 2020-12-02 ENCOUNTER — Telehealth: Payer: Self-pay | Admitting: *Deleted

## 2020-12-02 NOTE — Telephone Encounter (Signed)
Called [employer]) and spoke with Virgo A and the procedure code [id number] does not require authorization and the reference number is [id number] and I called and got the patient scheduled at [facility] on 12-14-2020 arrival time 3:30 pm for a 4:00 pm appointment and left a message for the patient as well. Lattie Haw

## 2020-12-15 ENCOUNTER — Telehealth: Payer: Self-pay

## 2020-12-15 NOTE — Telephone Encounter (Signed)
Breanna Berger with [facility] stopped by to let Dr. Jacqualyn Posey know that [facility] no showed for her MRI on 12/14/2020.

## 2020-12-16 DIAGNOSIS — Z794 Long term (current) use of insulin: Secondary | ICD-10-CM | POA: Diagnosis not present

## 2020-12-16 DIAGNOSIS — J40 Bronchitis, not specified as acute or chronic: Secondary | ICD-10-CM | POA: Diagnosis not present

## 2020-12-16 DIAGNOSIS — E782 Mixed hyperlipidemia: Secondary | ICD-10-CM | POA: Diagnosis not present

## 2020-12-16 DIAGNOSIS — Z87891 Personal history of nicotine dependence: Secondary | ICD-10-CM | POA: Diagnosis not present

## 2020-12-16 DIAGNOSIS — K5909 Other constipation: Secondary | ICD-10-CM | POA: Diagnosis not present

## 2020-12-16 DIAGNOSIS — J4530 Mild persistent asthma, uncomplicated: Secondary | ICD-10-CM | POA: Diagnosis not present

## 2020-12-16 DIAGNOSIS — G4733 Obstructive sleep apnea (adult) (pediatric): Secondary | ICD-10-CM | POA: Diagnosis not present

## 2020-12-16 DIAGNOSIS — I10 Essential (primary) hypertension: Secondary | ICD-10-CM | POA: Diagnosis not present

## 2020-12-16 DIAGNOSIS — E1165 Type 2 diabetes mellitus with hyperglycemia: Secondary | ICD-10-CM | POA: Diagnosis not present

## 2020-12-22 DIAGNOSIS — M722 Plantar fascial fibromatosis: Secondary | ICD-10-CM | POA: Diagnosis not present

## 2020-12-22 DIAGNOSIS — M47816 Spondylosis without myelopathy or radiculopathy, lumbar region: Secondary | ICD-10-CM | POA: Diagnosis not present

## 2020-12-22 DIAGNOSIS — S83282A Other tear of lateral meniscus, current injury, left knee, initial encounter: Secondary | ICD-10-CM | POA: Diagnosis not present

## 2020-12-22 DIAGNOSIS — M170 Bilateral primary osteoarthritis of knee: Secondary | ICD-10-CM | POA: Diagnosis not present

## 2020-12-22 DIAGNOSIS — M5136 Other intervertebral disc degeneration, lumbar region: Secondary | ICD-10-CM | POA: Diagnosis not present

## 2020-12-24 ENCOUNTER — Encounter (HOSPITAL_COMMUNITY): Payer: Self-pay

## 2020-12-24 ENCOUNTER — Other Ambulatory Visit: Payer: Self-pay

## 2020-12-24 ENCOUNTER — Emergency Department (HOSPITAL_COMMUNITY)
Admission: EM | Admit: 2020-12-24 | Discharge: 2020-12-25 | Disposition: A | Discharge status: Home / Self Care | Payer: Medicare Other | Attending: Emergency Medicine | Admitting: Emergency Medicine

## 2020-12-24 ENCOUNTER — Ambulatory Visit (INDEPENDENT_AMBULATORY_CARE_PROVIDER_SITE_OTHER)
Admission: EM | Admit: 2020-12-24 | Discharge: 2020-12-24 | Disposition: A | Discharge status: Home / Self Care | Payer: Medicare Other | Source: Home / Self Care

## 2020-12-24 DIAGNOSIS — Z794 Long term (current) use of insulin: Secondary | ICD-10-CM | POA: Diagnosis not present

## 2020-12-24 DIAGNOSIS — R739 Hyperglycemia, unspecified: Secondary | ICD-10-CM

## 2020-12-24 DIAGNOSIS — Z87891 Personal history of nicotine dependence: Secondary | ICD-10-CM | POA: Insufficient documentation

## 2020-12-24 DIAGNOSIS — Z7982 Long term (current) use of aspirin: Secondary | ICD-10-CM | POA: Insufficient documentation

## 2020-12-24 DIAGNOSIS — J449 Chronic obstructive pulmonary disease, unspecified: Secondary | ICD-10-CM | POA: Insufficient documentation

## 2020-12-24 DIAGNOSIS — I5033 Acute on chronic diastolic (congestive) heart failure: Secondary | ICD-10-CM | POA: Insufficient documentation

## 2020-12-24 DIAGNOSIS — Z881 Allergy status to other antibiotic agents status: Secondary | ICD-10-CM | POA: Insufficient documentation

## 2020-12-24 DIAGNOSIS — J029 Acute pharyngitis, unspecified: Secondary | ICD-10-CM

## 2020-12-24 DIAGNOSIS — E1165 Type 2 diabetes mellitus with hyperglycemia: Secondary | ICD-10-CM | POA: Diagnosis not present

## 2020-12-24 DIAGNOSIS — Z7984 Long term (current) use of oral hypoglycemic drugs: Secondary | ICD-10-CM | POA: Diagnosis not present

## 2020-12-24 DIAGNOSIS — Z7951 Long term (current) use of inhaled steroids: Secondary | ICD-10-CM | POA: Diagnosis not present

## 2020-12-24 DIAGNOSIS — R059 Cough, unspecified: Secondary | ICD-10-CM | POA: Insufficient documentation

## 2020-12-24 DIAGNOSIS — Z20822 Contact with and (suspected) exposure to covid-19: Secondary | ICD-10-CM | POA: Insufficient documentation

## 2020-12-24 DIAGNOSIS — J00 Acute nasopharyngitis [common cold]: Secondary | ICD-10-CM | POA: Insufficient documentation

## 2020-12-24 LAB — URINALYSIS, ROUTINE W REFLEX MICROSCOPIC
Bacteria, UA: NONE SEEN
Bilirubin Urine: NEGATIVE
Glucose, UA: >=500 mg/dL — AB
Hgb urine dipstick: NEGATIVE
Ketones, ur: NEGATIVE mg/dL
Leukocytes,Ua: NEGATIVE
Nitrite: NEGATIVE
Protein, ur: NEGATIVE mg/dL
Specific Gravity, Urine: 1.028 (ref 1.005–1.030)
pH: 5 (ref 5.0–8.0)

## 2020-12-24 LAB — POCT URINALYSIS DIPSTICK, ED / UC
Bilirubin Urine: NEGATIVE
Glucose, UA: >=1000 mg/dL — AB
Hgb urine dipstick: NEGATIVE
Ketones, ur: NEGATIVE mg/dL
Leukocytes,Ua: NEGATIVE
Nitrite: NEGATIVE
Protein, ur: NEGATIVE mg/dL
Specific Gravity, Urine: <=1.005 (ref 1.005–1.030)
Urobilinogen, UA: 0.2 mg/dL (ref 0.0–1.0)
pH: 5 (ref 5.0–8.0)

## 2020-12-24 LAB — CBC
HCT: 42 % (ref 36.0–46.0)
Hemoglobin: 12.9 g/dL (ref 12.0–15.0)
MCH: 23.7 pg — ABNORMAL LOW (ref 26.0–34.0)
MCHC: 30.7 g/dL (ref 30.0–36.0)
MCV: 77.1 fL — ABNORMAL LOW (ref 80.0–100.0)
Platelets: 209 10*3/uL (ref 150–400)
RBC: 5.45 MIL/uL — ABNORMAL HIGH (ref 3.87–5.11)
RDW: 15.7 % — ABNORMAL HIGH (ref 11.5–15.5)
WBC: 13.5 10*3/uL — ABNORMAL HIGH (ref 4.0–10.5)
nRBC: 0 % (ref 0.0–0.2)

## 2020-12-24 LAB — BASIC METABOLIC PANEL
Anion gap: 9 (ref 5–15)
BUN: 10 mg/dL (ref 6–20)
CO2: 25 mmol/L (ref 22–32)
Calcium: 9.6 mg/dL (ref 8.9–10.3)
Chloride: 101 mmol/L (ref 98–111)
Creatinine, Ser: 0.69 mg/dL (ref 0.44–1.00)
GFR, Estimated: >60 mL/min (ref >60)
Glucose, Bld: 410 mg/dL — ABNORMAL HIGH (ref 70–99)
Potassium: 4 mmol/L (ref 3.5–5.1)
Sodium: 135 mmol/L (ref 135–145)

## 2020-12-24 LAB — CBG MONITORING, ED: Glucose-Capillary: 435 mg/dL — ABNORMAL HIGH (ref 70–99)

## 2020-12-24 LAB — POCT RAPID STREP A, ED / UC: Streptococcus, Group A Screen (Direct): NEGATIVE

## 2020-12-24 MED ORDER — SODIUM CHLORIDE 0.9 % IV BOLUS
1000.0000 mL | Freq: Once | INTRAVENOUS | Status: AC
  Administered 2020-12-24: 1000 mL via INTRAVENOUS

## 2020-12-24 NOTE — Discharge Instructions (Addendum)
Advise follow up in the ED for further evaluation, glucose 577.
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
## 2020-12-24 NOTE — ED Triage Notes (Signed)
Pt reports Sx's present for one week. Pt went to PCP and was started on Anti-Bx . Pt now reports a sore throat which is new.
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
## 2020-12-24 NOTE — ED Triage Notes (Signed)
Pt requested a UA

## 2020-12-24 NOTE — ED Triage Notes (Signed)
Pt reports going to UC earlier this evening for sore throat and cough. Pt reports finishing a Zpack and prednisone about 4-5 days ago for same. At UC, CBG was over 500 and was sent here for evaluation.

## 2020-12-24 NOTE — ED Notes (Signed)
Patient is being discharged from the Urgent Care and sent to the Emergency Department via pov . Per Mayer Camel, patient is in need of higher level of care due to hyperglycemia. Patient is aware and verbalizes understanding of plan of care.  Vitals:   12/24/20 1814  BP: (!) 146/81  Pulse: 100  Resp: 20  Temp: 98.9 F (37.2 C)  SpO2: 97%

## 2020-12-25 LAB — CBG MONITORING, ED: Glucose-Capillary: 232 mg/dL — ABNORMAL HIGH (ref 70–99)

## 2020-12-25 LAB — SARS CORONAVIRUS 2 (TAT 6-24 HRS): SARS Coronavirus 2: NEGATIVE

## 2020-12-26 DIAGNOSIS — E1165 Type 2 diabetes mellitus with hyperglycemia: Secondary | ICD-10-CM | POA: Diagnosis not present

## 2020-12-26 DIAGNOSIS — E782 Mixed hyperlipidemia: Secondary | ICD-10-CM | POA: Diagnosis not present

## 2020-12-26 DIAGNOSIS — J4530 Mild persistent asthma, uncomplicated: Secondary | ICD-10-CM | POA: Diagnosis not present

## 2020-12-26 DIAGNOSIS — K5909 Other constipation: Secondary | ICD-10-CM | POA: Diagnosis not present

## 2020-12-26 DIAGNOSIS — Z794 Long term (current) use of insulin: Secondary | ICD-10-CM | POA: Diagnosis not present

## 2020-12-26 DIAGNOSIS — R051 Acute cough: Secondary | ICD-10-CM | POA: Diagnosis not present

## 2020-12-26 DIAGNOSIS — Z87891 Personal history of nicotine dependence: Secondary | ICD-10-CM | POA: Diagnosis not present

## 2020-12-26 DIAGNOSIS — I10 Essential (primary) hypertension: Secondary | ICD-10-CM | POA: Diagnosis not present

## 2020-12-26 DIAGNOSIS — H9202 Otalgia, left ear: Secondary | ICD-10-CM | POA: Diagnosis not present

## 2020-12-26 DIAGNOSIS — G4733 Obstructive sleep apnea (adult) (pediatric): Secondary | ICD-10-CM | POA: Diagnosis not present

## 2020-12-27 ENCOUNTER — Telehealth (HOSPITAL_COMMUNITY): Payer: Self-pay | Admitting: Emergency Medicine

## 2020-12-27 NOTE — Telephone Encounter (Signed)
Returned call from 12/26/20 to give Pt COVID and Strep test results.

## 2020-12-28 LAB — CBG MONITORING, ED: Glucose-Capillary: 577 mg/dL (ref 70–99)

## 2020-12-31 DIAGNOSIS — Z794 Long term (current) use of insulin: Secondary | ICD-10-CM | POA: Diagnosis not present

## 2020-12-31 DIAGNOSIS — E119 Type 2 diabetes mellitus without complications: Secondary | ICD-10-CM | POA: Diagnosis not present

## 2021-01-09 DIAGNOSIS — E1165 Type 2 diabetes mellitus with hyperglycemia: Secondary | ICD-10-CM | POA: Diagnosis not present

## 2021-01-09 DIAGNOSIS — G4733 Obstructive sleep apnea (adult) (pediatric): Secondary | ICD-10-CM | POA: Diagnosis not present

## 2021-01-09 DIAGNOSIS — Z87891 Personal history of nicotine dependence: Secondary | ICD-10-CM | POA: Diagnosis not present

## 2021-01-09 DIAGNOSIS — K5909 Other constipation: Secondary | ICD-10-CM | POA: Diagnosis not present

## 2021-01-09 DIAGNOSIS — I10 Essential (primary) hypertension: Secondary | ICD-10-CM | POA: Diagnosis not present

## 2021-01-09 DIAGNOSIS — Z794 Long term (current) use of insulin: Secondary | ICD-10-CM | POA: Diagnosis not present

## 2021-01-09 DIAGNOSIS — E782 Mixed hyperlipidemia: Secondary | ICD-10-CM | POA: Diagnosis not present

## 2021-01-09 DIAGNOSIS — R053 Chronic cough: Secondary | ICD-10-CM | POA: Diagnosis not present

## 2021-01-09 DIAGNOSIS — J4530 Mild persistent asthma, uncomplicated: Secondary | ICD-10-CM | POA: Diagnosis not present

## 2021-01-24 DIAGNOSIS — M5136 Other intervertebral disc degeneration, lumbar region: Secondary | ICD-10-CM | POA: Diagnosis not present

## 2021-01-24 DIAGNOSIS — G894 Chronic pain syndrome: Secondary | ICD-10-CM | POA: Diagnosis not present

## 2021-01-24 DIAGNOSIS — M170 Bilateral primary osteoarthritis of knee: Secondary | ICD-10-CM | POA: Diagnosis not present

## 2021-01-24 DIAGNOSIS — M722 Plantar fascial fibromatosis: Secondary | ICD-10-CM | POA: Diagnosis not present

## 2021-01-30 ENCOUNTER — Ambulatory Visit: Payer: No Typology Code available for payment source | Admitting: Podiatry

## 2021-01-30 DIAGNOSIS — U071 COVID-19: Secondary | ICD-10-CM | POA: Diagnosis not present

## 2021-01-30 DIAGNOSIS — J069 Acute upper respiratory infection, unspecified: Secondary | ICD-10-CM | POA: Diagnosis not present

## 2021-02-14 ENCOUNTER — Ambulatory Visit: Payer: Medicare Other | Admitting: Podiatry

## 2021-02-15 DIAGNOSIS — E119 Type 2 diabetes mellitus without complications: Secondary | ICD-10-CM | POA: Diagnosis not present

## 2021-02-15 DIAGNOSIS — Z794 Long term (current) use of insulin: Secondary | ICD-10-CM | POA: Diagnosis not present

## 2021-02-18 DIAGNOSIS — I872 Venous insufficiency (chronic) (peripheral): Secondary | ICD-10-CM | POA: Diagnosis not present

## 2021-02-18 DIAGNOSIS — Z794 Long term (current) use of insulin: Secondary | ICD-10-CM | POA: Diagnosis not present

## 2021-02-18 DIAGNOSIS — Z8631 Personal history of diabetic foot ulcer: Secondary | ICD-10-CM | POA: Diagnosis not present

## 2021-02-18 DIAGNOSIS — L84 Corns and callosities: Secondary | ICD-10-CM | POA: Diagnosis not present

## 2021-02-18 DIAGNOSIS — E119 Type 2 diabetes mellitus without complications: Secondary | ICD-10-CM | POA: Diagnosis not present

## 2021-02-23 ENCOUNTER — Institutional Professional Consult (permissible substitution): Payer: Medicare Other | Admitting: Emergency Medicine

## 2021-02-23 DIAGNOSIS — S83282A Other tear of lateral meniscus, current injury, left knee, initial encounter: Secondary | ICD-10-CM | POA: Diagnosis not present

## 2021-02-23 DIAGNOSIS — M170 Bilateral primary osteoarthritis of knee: Secondary | ICD-10-CM | POA: Diagnosis not present

## 2021-02-23 DIAGNOSIS — M722 Plantar fascial fibromatosis: Secondary | ICD-10-CM | POA: Diagnosis not present

## 2021-02-23 DIAGNOSIS — G894 Chronic pain syndrome: Secondary | ICD-10-CM | POA: Diagnosis not present

## 2021-03-07 ENCOUNTER — Institutional Professional Consult (permissible substitution): Payer: Medicare Other | Admitting: Emergency Medicine

## 2021-03-08 DIAGNOSIS — M25475 Effusion, left foot: Secondary | ICD-10-CM | POA: Diagnosis not present

## 2021-03-08 DIAGNOSIS — M7752 Other enthesopathy of left foot: Secondary | ICD-10-CM | POA: Diagnosis not present

## 2021-03-14 ENCOUNTER — Ambulatory Visit (INDEPENDENT_AMBULATORY_CARE_PROVIDER_SITE_OTHER): Payer: Medicare Other | Admitting: Podiatry

## 2021-03-14 ENCOUNTER — Other Ambulatory Visit: Payer: Self-pay

## 2021-03-14 ENCOUNTER — Encounter: Payer: Self-pay | Admitting: Podiatry

## 2021-03-14 DIAGNOSIS — M7752 Other enthesopathy of left foot: Secondary | ICD-10-CM | POA: Diagnosis not present

## 2021-03-14 DIAGNOSIS — B351 Tinea unguium: Secondary | ICD-10-CM | POA: Diagnosis not present

## 2021-03-14 DIAGNOSIS — E1165 Type 2 diabetes mellitus with hyperglycemia: Secondary | ICD-10-CM | POA: Diagnosis not present

## 2021-03-14 DIAGNOSIS — M79675 Pain in left toe(s): Secondary | ICD-10-CM

## 2021-03-14 DIAGNOSIS — W172XXS Fall into hole, sequela: Secondary | ICD-10-CM | POA: Diagnosis not present

## 2021-03-14 DIAGNOSIS — M79674 Pain in right toe(s): Secondary | ICD-10-CM

## 2021-03-14 MED ORDER — MELOXICAM 7.5 MG PO TABS: 7.5000 mg | ORAL_TABLET | Freq: Every day | ORAL | 0 refills | Status: DC | PRN

## 2021-03-14 NOTE — Patient Instructions (Signed)
Meloxicam Tablets What is this medication? MELOXICAM (mel OX i cam) treats mild to moderate pain, inflammation, or arthritis. It works by decreasing inflammation. It belongs to a group of medications called NSAIDs. This medicine may be used for other purposes; ask your health care provider or pharmacist if you have questions. COMMON BRAND NAME(S): Mobic What should I tell my care team before I take this medication? They need to know if you have any of these conditions: Asthma (lung or breathing disease) Bleeding disorder Coronary artery bypass graft (CABG) within the past 2 weeks Dehydration Heart attack Heart disease Heart failure High blood pressure If you often drink alcohol Kidney disease Liver disease Smoke tobacco cigarettes Stomach bleeding Stomach ulcers, other stomach or intestine problems Take medications that treat or prevent blood clots Taking other steroids like dexamethasone or prednisone An unusual or allergic reaction to meloxicam, other medications, foods, dyes, or preservatives Pregnant or trying to get pregnant Breast-feeding How should I use this medication? Take this medication by mouth. Take it as directed on the prescription label at the same time every day. You can take it with or without food. If it upsets your stomach, take it with food. Do not use it more often than directed. There may be unused or extra doses in the bottle after you finish your treatment. Talk to your care team if you have questions about your dose. A special MedGuide will be given to you by the pharmacist with each prescription and refill. Be sure to read this information carefully each time. Talk to your care team about the use of this medication in children. Special care may be needed. Patients over 27 years of age may have a stronger reaction and need a smaller dose. Overdosage: If you think you have taken too much of this medicine contact a poison control center or emergency room at  once. NOTE: This medicine is only for you. Do not share this medicine with others. What if I miss a dose? If you miss a dose, take it as soon as you can. If it is almost time for your next dose, take only that dose. Do not take double or extra doses. What may interact with this medication? Do not take this medication with any of the following: Cidofovir Ketorolac This medication may also interact with the following: Aspirin and aspirin-like medications Certain medications for blood pressure, heart disease, irregular heart beat Certain medications for depression, anxiety, or psychotic disturbances Certain medications that treat or prevent blood clots like warfarin, enoxaparin, dalteparin, apixaban, dabigatran, rivaroxaban Cyclosporine Diuretics Fluconazole Lithium Methotrexate Other NSAIDs, medications for pain and inflammation, like ibuprofen and naproxen Pemetrexed This list may not describe all possible interactions. Give your health care provider a list of all the medicines, herbs, non-prescription drugs, or dietary supplements you use. Also tell them if you smoke, drink alcohol, or use illegal drugs. Some items may interact with your medicine. What should I watch for while using this medication? Visit your care team for regular checks on your progress. Tell your care team if your symptoms do not start to get better or if they get worse. Do not take other medications that contain aspirin, ibuprofen, or naproxen with this medication. Side effects such as stomach upset, nausea, or ulcers may be more likely to occur. Many non-prescription medications contain aspirin, ibuprofen, or naproxen. Always read labels carefully. This medication can cause serious ulcers and bleeding in the stomach. It can happen with no warning. Smoking, drinking alcohol, older age, and  poor health can also increase risks. Call your care team right away if you have stomach pain or blood in your vomit or stool. This  medication does not prevent a heart attack or stroke. This medication may increase the chance of a heart attack or stroke. The chance may increase the longer you use this medication or if you have heart disease. If you take aspirin to prevent a heart attack or stroke, talk to your care team about using this medication. Alcohol may interfere with the effect of this medication. Avoid alcoholic drinks. This medication may cause serious skin reactions. They can happen weeks to months after starting the medication. Contact your care team right away if you notice fevers or flu-like symptoms with a rash. The rash may be red or purple and then turn into blisters or peeling of the skin. Or, you might notice a red rash with swelling of the face, lips or lymph nodes in your neck or under your arms. Talk to your care team if you are pregnant before taking this medication. Taking this medication between weeks 20 and 30 of pregnancy may harm your unborn baby. Your care team will monitor you closely if you need to take it. After 30 weeks of pregnancy, do not take this medication. You may get drowsy or dizzy. Do not drive, use machinery, or do anything that needs mental alertness until you know how this medication affects you. Do not stand up or sit up quickly, especially if you are an older patient. This reduces the risk of dizzy or fainting spells. Be careful brushing or flossing your teeth or using a toothpick because you may get an infection or bleed more easily. If you have any dental work done, tell your dentist you are receiving this medication. This medication may make it more difficult to get pregnant. Talk to your care team if you are concerned about your fertility. What side effects may I notice from receiving this medication? Side effects that you should report to your care team as soon as possible: Allergic reactions-skin rash, itching, hives, swelling of the face, lips, tongue, or throat Bleeding-bloody or  black, tar-like stools, vomiting blood or brown material that looks like coffee grounds, red or dark brown urine, small red or purple spots on skin, unusual bruising or bleeding Heart attack-pain or tightness in the chest, shoulders, arms, or jaw, nausea, shortness of breath, cold or clammy skin, feeling faint or lightheaded Heart failure-shortness of breath, swelling of ankles, feet, or hands, sudden weight gain, unusual weakness or fatigue Increase in blood pressure Kidney injury-decrease in the amount of urine, swelling of the ankles, hands, or feet Liver injury-right upper belly pain, loss of appetite, nausea, light-colored stool, dark yellow or brown urine, yellowing skin or eyes, unusual weakness or fatigue Rash, fever, and swollen lymph nodes Redness, blistering, peeling, or loosening of the skin, including inside the mouth Stroke-sudden numbness or weakness of the face, arm, or leg, trouble speaking, confusion, trouble walking, loss of balance or coordination, dizziness, severe headache, change in vision Side effects that usually do not require medical attention (report to your care team if they continue or are bothersome): Diarrhea Nausea Upset stomach This list may not describe all possible side effects. Call your doctor for medical advice about side effects. You may report side effects to FDA at 1-800-FDA-1088. Where should I keep my medication? Keep out of the reach of children and pets. Store at room temperature between 20 and 25 degrees C (68 and  77 degrees F). Protect from moisture. Keep the container tightly closed. Get rid of any unused medication after the expiration date. To get rid of medications that are no longer needed or have expired: Take the medication to a medication take-back program. Check with your pharmacy or law enforcement to find a location. If you cannot return the medication, check the label or package insert to see if the medication should be thrown out in the  garbage or flushed down the toilet. If you are not sure, ask your care team. If it is safe to put it in the trash, empty the medication out of the container. Mix the medication with cat litter, dirt, coffee grounds, or other unwanted substance. Seal the mixture in a bag or container. Put it in the trash. NOTE: This sheet is a summary. It may not cover all possible information. If you have questions about this medicine, talk to your doctor, pharmacist, or health care provider.  2022 Elsevier/Gold Standard (2020-07-21 11:53:22)

## 2021-03-17 DIAGNOSIS — E119 Type 2 diabetes mellitus without complications: Secondary | ICD-10-CM | POA: Diagnosis not present

## 2021-03-17 DIAGNOSIS — Z794 Long term (current) use of insulin: Secondary | ICD-10-CM | POA: Diagnosis not present

## 2021-03-19 NOTE — Progress Notes (Signed)
Subjective: 60 year old female presents the office today for follow-up evaluation of left foot pain and discuss MRI results.  This started after injury, fall in September 2021.  She states that she continues to get pain into her foot as well as swelling mostly to the forefoot but she states is difficult to pinpoint specific area.  She states that the pain is intermittent and she starting to walk more.  Also has of the nails were trimmed today as are thickened discolored she cannot do them herself.  Objective: AAO x3, NAD DP/PT pulses palpable bilaterally, CRT less than 3 seconds On today's exam the majority of tenderness is localized on the midfoot area.  The pain has moved along the interspaces of the metatarsals.  There is still some mild discomfort on the dorsal midfoot, Lisfranc joint but there is no specific area of pinpoint tenderness.  Flexor, extensor tendons appear to be intact.  MMT 5/5.   Nails are hypertrophic, dystrophic, brittle, discolored, elongated 10. No surrounding redness or drainage. Tenderness nails 1-5 bilaterally. No open lesions or pre-ulcerative lesions are identified today. No pain with calf compression, swelling, warmth, erythema  Assessment: Bursitis; symptomatic onychomycosis  Plan: -All treatment options discussed with the patient including all alternatives, risks, complications.  -I reviewed the MRI with the patient which did reveal no significant bony abnormality.  There is mild first, second, third interspace bursitis.  Mild first MTPJ effusion. -Discussed your injection today.  Of her prescribed meloxicam discussed side effects.  Discussed wearing shoes and support.  Metatarsal offloading pads were dispensed. -Sharply debrided the nails x10 without any complications or bleeding. -Patient encouraged to call the office with any questions, concerns, change in symptoms.

## 2021-03-24 DIAGNOSIS — M170 Bilateral primary osteoarthritis of knee: Secondary | ICD-10-CM | POA: Diagnosis not present

## 2021-03-24 DIAGNOSIS — G894 Chronic pain syndrome: Secondary | ICD-10-CM | POA: Diagnosis not present

## 2021-03-24 DIAGNOSIS — M47816 Spondylosis without myelopathy or radiculopathy, lumbar region: Secondary | ICD-10-CM | POA: Diagnosis not present

## 2021-03-24 DIAGNOSIS — M722 Plantar fascial fibromatosis: Secondary | ICD-10-CM | POA: Diagnosis not present

## 2021-03-29 DIAGNOSIS — K760 Fatty (change of) liver, not elsewhere classified: Secondary | ICD-10-CM | POA: Diagnosis not present

## 2021-03-29 DIAGNOSIS — Z7901 Long term (current) use of anticoagulants: Secondary | ICD-10-CM | POA: Diagnosis not present

## 2021-03-31 DIAGNOSIS — H1013 Acute atopic conjunctivitis, bilateral: Secondary | ICD-10-CM | POA: Diagnosis not present

## 2021-03-31 DIAGNOSIS — G4733 Obstructive sleep apnea (adult) (pediatric): Secondary | ICD-10-CM | POA: Diagnosis not present

## 2021-03-31 DIAGNOSIS — E782 Mixed hyperlipidemia: Secondary | ICD-10-CM | POA: Diagnosis not present

## 2021-03-31 DIAGNOSIS — Z794 Long term (current) use of insulin: Secondary | ICD-10-CM | POA: Diagnosis not present

## 2021-03-31 DIAGNOSIS — J4530 Mild persistent asthma, uncomplicated: Secondary | ICD-10-CM | POA: Diagnosis not present

## 2021-03-31 DIAGNOSIS — I10 Essential (primary) hypertension: Secondary | ICD-10-CM | POA: Diagnosis not present

## 2021-03-31 DIAGNOSIS — K5909 Other constipation: Secondary | ICD-10-CM | POA: Diagnosis not present

## 2021-03-31 DIAGNOSIS — Z87891 Personal history of nicotine dependence: Secondary | ICD-10-CM | POA: Diagnosis not present

## 2021-03-31 DIAGNOSIS — E1165 Type 2 diabetes mellitus with hyperglycemia: Secondary | ICD-10-CM | POA: Diagnosis not present

## 2021-04-11 DIAGNOSIS — G4733 Obstructive sleep apnea (adult) (pediatric): Secondary | ICD-10-CM | POA: Diagnosis not present

## 2021-04-11 DIAGNOSIS — Z87891 Personal history of nicotine dependence: Secondary | ICD-10-CM | POA: Diagnosis not present

## 2021-04-11 DIAGNOSIS — J4530 Mild persistent asthma, uncomplicated: Secondary | ICD-10-CM | POA: Diagnosis not present

## 2021-04-11 DIAGNOSIS — I10 Essential (primary) hypertension: Secondary | ICD-10-CM | POA: Diagnosis not present

## 2021-04-11 DIAGNOSIS — Z794 Long term (current) use of insulin: Secondary | ICD-10-CM | POA: Diagnosis not present

## 2021-04-11 DIAGNOSIS — L0232 Furuncle of buttock: Secondary | ICD-10-CM | POA: Diagnosis not present

## 2021-04-11 DIAGNOSIS — E782 Mixed hyperlipidemia: Secondary | ICD-10-CM | POA: Diagnosis not present

## 2021-04-11 DIAGNOSIS — E1165 Type 2 diabetes mellitus with hyperglycemia: Secondary | ICD-10-CM | POA: Diagnosis not present

## 2021-04-11 DIAGNOSIS — K5909 Other constipation: Secondary | ICD-10-CM | POA: Diagnosis not present

## 2021-04-12 ENCOUNTER — Other Ambulatory Visit: Payer: Self-pay

## 2021-04-12 ENCOUNTER — Ambulatory Visit (INDEPENDENT_AMBULATORY_CARE_PROVIDER_SITE_OTHER): Payer: Medicare Other | Admitting: Emergency Medicine

## 2021-04-12 ENCOUNTER — Ambulatory Visit (INDEPENDENT_AMBULATORY_CARE_PROVIDER_SITE_OTHER): Payer: Medicare Other

## 2021-04-12 ENCOUNTER — Encounter: Payer: Self-pay | Admitting: Emergency Medicine

## 2021-04-12 VITALS — BP 118/72 | HR 72 | Temp 98.2°F | Ht 69.0 in | Wt 287.4 lb

## 2021-04-12 DIAGNOSIS — J4520 Mild intermittent asthma, uncomplicated: Secondary | ICD-10-CM

## 2021-04-12 DIAGNOSIS — R053 Chronic cough: Secondary | ICD-10-CM | POA: Diagnosis not present

## 2021-04-12 DIAGNOSIS — R059 Cough, unspecified: Secondary | ICD-10-CM

## 2021-04-12 DIAGNOSIS — G4733 Obstructive sleep apnea (adult) (pediatric): Secondary | ICD-10-CM | POA: Diagnosis not present

## 2021-04-12 NOTE — Progress Notes (Signed)
Subjective:    Patient ID: Breanna Berger, female    DOB: [birth year], 60 y.o.   MRN: [id number]  HPI 60 year old former smoker (7 pack years) with a history of diabetes, hyperlipidemia, mitral valve prolapse, OSA not on CPAP, mild persistent asthma for which she has been on albuterol. She is here today to evaluate chronic cough and dyspnea.  She has had COVID twice, including about 3 months ago. She has kept a cough since then. Dry cough. Can happen through the day, no clear triggers. She can have GERD in the evenings, uses TUMS but not often. Her hyperglycemia has been very labile. She has some PND, has daily allergy sx, mainly eye running.  Currently managed on Advair that she only uses during flaring . She uses albuterol rarely.  Looks like she is on lisinopril, although she is not clear about this - says she is not taking. Losartan is also on her list, she says she is not taking.    Pulmonary function testing performed 09/16/2013 reviewed by me, shows principally restriction, possible obstruction based on a bronchodilator response 25-75% (unclear), decrease diffusion capacity that corrects to the normal range when adjusted for alveolar volume.   Review of Systems As per HPI   Past Medical History:  Diagnosis Date   Abdominal pain, unspecified site 08/24/2013   Diabetes mellitus    Dyspnea 08/24/2013   HLD (hyperlipidemia)    MVP (mitral valve prolapse)    Sinusitis    Suppurative hidradenitis 07/27/2013   SVT (supraventricular tachycardia) ([place]) 09/09/2014   converted with vagal    UTI (lower urinary tract infection) 07/27/2013     Family History  Problem Relation Age of Onset   Diabetes Paternal Grandmother    Heart disease Paternal Aunt    Clotting disorder Mother    Lymphoma Mother    Stomach cancer Maternal Aunt    Colon cancer Maternal Aunt    Prostate cancer Maternal Uncle    Heart disease Paternal Grandfather    Coronary artery disease Paternal Grandfather    Lung  cancer Maternal Uncle      Social History   Socioeconomic History   Marital status: Single    Spouse name: Not on file   Number of children: 2   Years of education: Not on file   Highest education level: Not on file  Occupational History   Occupation: Unemployed  Tobacco Use   Smoking status: Former    Years: 15.00    Types: Cigarettes    Quit date: 05/12/2015    Years since quitting: 5.9   Smokeless tobacco: Never  Substance and Sexual Activity   Alcohol use: No    Alcohol/week: 0.0 standard drinks   Drug use: No   Sexual activity: Yes    Birth control/protection: Surgical  Other Topics Concern   Not on file  Social History Narrative   Not on file   Social Determinants of Health   Financial Resource Strain: Not on file  Food Insecurity: Not on file  Transportation Needs: Not on file  Physical Activity: Not on file  Stress: Not on file  Social Connections: Not on file  Intimate Partner Violence: Not on file    Has worked in a lab setting, some chemical exposures Has lived in [place], [place]  Allergies  Allergen Reactions   Erythromycin Hives, Itching and Swelling   Keflex [Cephalexin] Hives, Itching and Swelling   Doxycycline Hyclate Rash     Outpatient Medications Prior to Visit  Medication  Sig Dispense Refill   albuterol (PROVENTIL HFA;VENTOLIN HFA) 108 (90 BASE) MCG/ACT inhaler Inhale 2 puffs into the lungs every 6 (six) hours as needed for wheezing or shortness of breath. 18 g 3   aspirin 81 MG chewable tablet aspirin 81 mg chewable tablet     atorvastatin (LIPITOR) 20 MG tablet Take 1 tablet by mouth at bedtime.     atorvastatin (LIPITOR) 40 MG tablet Take 40 mg by mouth every evening.  0   azithromycin (ZITHROMAX) 250 MG tablet TAKE 2 TABLETS BY MOUTH ON DAY 1, AND THEN TAKE 1 TABLET BY MOUTH ONCE A DAY ON DAY 2 THROUGH DAY 5     BINAXNOW COVID-19 AG HOME TEST KIT See admin instructions.     Blood Glucose Monitoring Suppl (ACCU-CHEK GUIDE ME) w/Device KIT Use to  check blood sugar 3 times a day 1 kit 0   cephALEXin (KEFLEX) 500 MG capsule cephalexin 500 mg capsule     Cholecalciferol 25 MCG (1000 UT) capsule Take by mouth.     cyclobenzaprine (FLEXERIL) 5 MG tablet cyclobenzaprine 5 mg tablet  TAKE 1 TO 2 TABLETS BY MOUTH THREE TIMES DAILY AS NEEDED FOR 10 DAYS. STOP GLIMEPERIDE.     diazepam (VALIUM) 10 MG tablet diazepam 10 mg tablet     diclofenac Sodium (VOLTAREN) 1 % GEL APPLY 4 GRAMS TO THE AFFECTED AREA BY TOPICAL ROUTE 4 TIMES PER DAY     Dulaglutide (TRULICITY) 3 SW/5.4OE SOPN Inject 0.5 mLs (3 mg total) into the skin once a week. 12 pen 3   fluconazole (DIFLUCAN) 150 MG tablet Take 150 mg by mouth once.     fluticasone (FLONASE) 50 MCG/ACT nasal spray Place into the nose.     Fluticasone-Salmeterol (ADVAIR) 100-50 MCG/DOSE AEPB INHALE 1 DOSE BY MOUTH TWICE DAILY     furosemide (LASIX) 20 MG tablet Take by mouth.     gabapentin (NEURONTIN) 300 MG capsule gabapentin 300 mg capsule     glimepiride (AMARYL) 4 MG tablet Take 2 mg by mouth 2 (two) times daily.     glipiZIDE (GLUCOTROL XL) 10 MG 24 hr tablet glipizide ER 10 mg tablet, extended release 24 hr  TAKE 1 TABLET BY MOUTH ONCE DAILY     glucose blood (ACCU-CHEK GUIDE) test strip Use to check blood sugar 3 times a day. 300 each 12   HUMULIN N 100 UNIT/ML injection SMARTSIG:2-15 Unit(s) SUB-Q 3 Times Daily     HYDROcodone-acetaminophen (NORCO) 10-325 MG tablet TAKE 1 TABLET BY MOUTH UP TO FIVE TIMES DAILY IF NEEDED     HYDROcodone-acetaminophen (NORCO) 7.5-325 MG tablet hydrocodone 7.5 mg-acetaminophen 325 mg tablet  TAKE 1 TABLET BY MOUTH EVERY 8 HOURS AS NEEDED     ibuprofen (ADVIL) 800 MG tablet Take 800 mg by mouth 3 (three) times daily.     icosapent Ethyl (VASCEPA) 1 g capsule icosapent ethyl 1 gram capsule  TAKE 2 CAPSULES BY MOUTH TWICE DAILY FOR 30 DAYS     insulin lispro (HUMALOG) 100 UNIT/ML KwikPen SMARTSIG:2-15 Unit(s) SUB-Q 3 Times Daily     LANTUS SOLOSTAR 100 UNIT/ML  Solostar Pen Inject 30 Units into the skin at bedtime. 5 pen 3   lidocaine (LIDODERM) 5 % Place 1 patch onto the skin daily. Remove & Discard patch within 12 hours or as directed by MD 30 patch 0   LINZESS 290 MCG CAPS capsule Take 290 mcg by mouth daily.     liraglutide (VICTOZA) 18 MG/3ML SOPN Victoza 3-Pak 0.6 mg/0.1  mL (18 mg/3 mL) subcutaneous pen injector     lisinopril (ZESTRIL) 10 MG tablet Take 1 tablet by mouth daily.     losartan (COZAAR) 25 MG tablet Take 12.5 mg by mouth daily.     meloxicam (MOBIC) 15 MG tablet Take 1 tablet by mouth every morning.  0   meloxicam (MOBIC) 7.5 MG tablet Take 1 tablet (7.5 mg total) by mouth daily as needed for pain. 14 tablet 0   metFORMIN (GLUCOPHAGE) 1000 MG tablet Take 1 tablet (1,000 mg total) by mouth 2 (two) times daily with a meal. 60 tablet 0   metoprolol succinate (TOPROL-XL) 50 MG 24 hr tablet Take 50 mg by mouth daily. Take with or immediately following a meal.     montelukast (SINGULAIR) 10 MG tablet montelukast 10 mg tablet  TAKE 1 TABLET BY MOUTH ONCE DAILY FOR 30 DAYS     nitroGLYCERIN (NITROSTAT) 0.4 MG SL tablet SMARTSIG:1 Sublingual Every Night     NONFORMULARY OR COMPOUNDED ITEM Antifungal solution: Terbinafine 3%, Fluconazole 2%, Tea Tree Oil 5%, Urea 10%, Ibuprofen 2% in DMSO suspension #77mL 1 each 3   Olopatadine HCl 0.2 % SOLN olopatadine 0.2 % eye drops     oseltamivir (TAMIFLU) 75 MG capsule oseltamivir 75 mg capsule     oxyCODONE-acetaminophen (PERCOCET) 7.5-325 MG tablet Take 1 tablet by mouth 4 (four) times daily.     predniSONE (DELTASONE) 20 MG tablet Take 20 mg by mouth daily.     promethazine-dextromethorphan (PROMETHAZINE-DM) 6.25-15 MG/5ML syrup promethazine-DM 6.25 mg-15 mg/5 mL oral syrup     RELION PEN NEEDLES 32G X 4 MM MISC      spironolactone (ALDACTONE) 50 MG tablet spironolactone 50 mg tablet     tiZANidine (ZANAFLEX) 4 MG tablet Take by mouth.     No facility-administered medications prior to visit.          Objective:   Physical Exam Vitals:   04/12/21 1150  BP: 118/72  Pulse: 72  Temp: 98.2 F (36.8 C)  TempSrc: Oral  SpO2: 99%  Weight: 287 lb 6.4 oz (130.4 kg)  Height: $Remove'5\' 9"'iPHXIVn$  (1.753 m)    Gen: Pleasant, obese woman, in no distress,  normal affect  ENT: No lesions,  mouth clear,  oropharynx clear, no postnasal drip  Neck: No JVD, no stridor  Lungs: No use of accessory muscles, no crackles or wheezing on normal respiration, no wheeze on forced expiration  Cardiovascular: RRR, heart sounds normal, no murmur or gallops, no peripheral edema  Musculoskeletal: No deformities, no cyanosis or clubbing  Neuro: alert, awake, non focal  Skin: Warm, no lesions or rash      Assessment & Plan:  OSA (obstructive sleep apnea) Not on CPAP  Asthma Possible asthma versus upper airway irritation syndrome.  She has chronic cough is her principal symptom.  Pulmonary function testing showed mainly restriction with a possible bronchodilator response with her mid flows, unclear significance.  We will plan to repeat her pulmonary function testing.  Hold off on restarting Advair for now.  She can keep albuterol available to use if needed.  Chronic cough Based on her description suspect this is mainly upper airway in nature.  It started about 3 months ago when she had COVID-19, has been sustained since then.  Contributing factors may include GERD which she experiences intermittently.  She also has allergic disease although not a lot of PND.  Consider also some underlying obstructive lung disease although this is also unclear.  Her medicine  list says she has been on lisinopril, losartan but she does not recall these medications.  Certainly they would be contributors as well if she were on them.  We will try to quantify her degree of obstruction and identify contributing factors.  We will also confirm her medication list, get her off ACE inhibitor if she is on it.  Chest x-ray today.  We will  perform a chest x-ray today We will perform pulmonary function testing in next office visit. Do not restart your Advair for now. Keep your albuterol available to use 2 puffs when needed for shortness of breath, chest tightness, wheezing. Bring all your medications with you to your next office visit so we can make sure we have an accurate list. Follow with Dr. Lamonte Sakai next available with full pulmonary function testing on the same day.    Baltazar Apo, MD, PhD 04/12/2021, 12:20 PM [facility] [phone] or if no answer before 7:00PM call [phone] For any issues after 7:00PM please call eLink [phone]

## 2021-04-12 NOTE — Assessment & Plan Note (Signed)
Based on her description suspect this is mainly upper airway in nature.  It started about 3 months ago when she had COVID-19, has been sustained since then.  Contributing factors may include GERD which she experiences intermittently.  She also has allergic disease although not a lot of PND.  Consider also some underlying obstructive lung disease although this is also unclear.  Her medicine list says she has been on lisinopril, losartan but she does not recall these medications.  Certainly they would be contributors as well if she were on them.  We will try to quantify her degree of obstruction and identify contributing factors.  We will also confirm her medication list, get her off ACE inhibitor if she is on it.  Chest x-ray today.  We will perform a chest x-ray today We will perform pulmonary function testing in next office visit. Do not restart your Advair for now. Keep your albuterol available to use 2 puffs when needed for shortness of breath, chest tightness, wheezing. Bring all your medications with you to your next office visit so we can make sure we have an accurate list. Follow with Dr. Lamonte Sakai next available with full pulmonary function testing on the same day.

## 2021-04-12 NOTE — Assessment & Plan Note (Signed)
Possible asthma versus upper airway irritation syndrome.  She has chronic cough is her principal symptom.  Pulmonary function testing showed mainly restriction with a possible bronchodilator response with her mid flows, unclear significance.  We will plan to repeat her pulmonary function testing.  Hold off on restarting Advair for now.  She can keep albuterol available to use if needed.

## 2021-04-12 NOTE — Patient Instructions (Signed)
We will perform a chest x-ray today We will perform pulmonary function testing in next office visit. Do not restart your Advair for now. Keep your albuterol available to use 2 puffs when needed for shortness of breath, chest tightness, wheezing. Bring all your medications with you to your next office visit so we can make sure we have an accurate list. Follow with Dr. Lamonte Sakai next available with full pulmonary function testing on the same day.

## 2021-04-17 ENCOUNTER — Other Ambulatory Visit: Payer: Self-pay | Admitting: Physician Assistant

## 2021-04-17 DIAGNOSIS — Z1231 Encounter for screening mammogram for malignant neoplasm of breast: Secondary | ICD-10-CM

## 2021-04-17 DIAGNOSIS — E119 Type 2 diabetes mellitus without complications: Secondary | ICD-10-CM | POA: Diagnosis not present

## 2021-04-17 DIAGNOSIS — Z794 Long term (current) use of insulin: Secondary | ICD-10-CM | POA: Diagnosis not present

## 2021-04-19 DIAGNOSIS — M722 Plantar fascial fibromatosis: Secondary | ICD-10-CM | POA: Diagnosis not present

## 2021-04-19 DIAGNOSIS — M5136 Other intervertebral disc degeneration, lumbar region: Secondary | ICD-10-CM | POA: Diagnosis not present

## 2021-04-19 DIAGNOSIS — M170 Bilateral primary osteoarthritis of knee: Secondary | ICD-10-CM | POA: Diagnosis not present

## 2021-04-19 DIAGNOSIS — Z79899 Other long term (current) drug therapy: Secondary | ICD-10-CM | POA: Diagnosis not present

## 2021-04-19 DIAGNOSIS — Z79891 Long term (current) use of opiate analgesic: Secondary | ICD-10-CM | POA: Diagnosis not present

## 2021-04-19 DIAGNOSIS — G894 Chronic pain syndrome: Secondary | ICD-10-CM | POA: Diagnosis not present

## 2021-05-10 ENCOUNTER — Other Ambulatory Visit: Payer: Self-pay | Admitting: Emergency Medicine

## 2021-05-10 DIAGNOSIS — R059 Cough, unspecified: Secondary | ICD-10-CM

## 2021-05-12 DIAGNOSIS — E1165 Type 2 diabetes mellitus with hyperglycemia: Secondary | ICD-10-CM | POA: Diagnosis not present

## 2021-05-12 DIAGNOSIS — J4530 Mild persistent asthma, uncomplicated: Secondary | ICD-10-CM | POA: Diagnosis not present

## 2021-05-12 DIAGNOSIS — E782 Mixed hyperlipidemia: Secondary | ICD-10-CM | POA: Diagnosis not present

## 2021-05-12 DIAGNOSIS — Z794 Long term (current) use of insulin: Secondary | ICD-10-CM | POA: Diagnosis not present

## 2021-05-12 DIAGNOSIS — Z0001 Encounter for general adult medical examination with abnormal findings: Secondary | ICD-10-CM | POA: Diagnosis not present

## 2021-05-12 DIAGNOSIS — I10 Essential (primary) hypertension: Secondary | ICD-10-CM | POA: Diagnosis not present

## 2021-05-15 ENCOUNTER — Other Ambulatory Visit: Payer: Self-pay | Admitting: Emergency Medicine

## 2021-05-17 ENCOUNTER — Ambulatory Visit (INDEPENDENT_AMBULATORY_CARE_PROVIDER_SITE_OTHER): Payer: Medicare Other | Admitting: Emergency Medicine

## 2021-05-17 ENCOUNTER — Other Ambulatory Visit: Payer: Self-pay

## 2021-05-17 DIAGNOSIS — M5136 Other intervertebral disc degeneration, lumbar region: Secondary | ICD-10-CM | POA: Diagnosis not present

## 2021-05-17 DIAGNOSIS — G894 Chronic pain syndrome: Secondary | ICD-10-CM | POA: Diagnosis not present

## 2021-05-17 DIAGNOSIS — M170 Bilateral primary osteoarthritis of knee: Secondary | ICD-10-CM | POA: Diagnosis not present

## 2021-05-17 DIAGNOSIS — S83282A Other tear of lateral meniscus, current injury, left knee, initial encounter: Secondary | ICD-10-CM | POA: Diagnosis not present

## 2021-05-17 DIAGNOSIS — R059 Cough, unspecified: Secondary | ICD-10-CM

## 2021-05-17 LAB — PULMONARY FUNCTION TEST
DL/VA % pred: 110 %
DL/VA: 4.57 ml/min/mmHg/L
DLCO cor % pred: 70 %
DLCO cor: 16.07 ml/min/mmHg
DLCO unc % pred: 70 %
DLCO unc: 16.07 ml/min/mmHg
FEF 25-75 Post: 4.93 L/sec
FEF 25-75 Pre: 4.6 L/sec
FEF2575-%Change-Post: 7 %
FEF2575-%Pred-Post: 205 %
FEF2575-%Pred-Pre: 191 %
FEV1-%Change-Post: 2 %
FEV1-%Pred-Post: 86 %
FEV1-%Pred-Pre: 85 %
FEV1-Post: 2.14 L
FEV1-Pre: 2.1 L
FEV1FVC-%Change-Post: 0 %
FEV1FVC-%Pred-Pre: 117 %
FEV6-%Change-Post: 1 %
FEV6-%Pred-Post: 74 %
FEV6-%Pred-Pre: 73 %
FEV6-Post: 2.26 L
FEV6-Pre: 2.23 L
FEV6FVC-%Pred-Post: 103 %
FEV6FVC-%Pred-Pre: 103 %
FVC-%Change-Post: 1 %
FVC-%Pred-Post: 72 %
FVC-%Pred-Pre: 71 %
FVC-Post: 2.26 L
FVC-Pre: 2.23 L
Post FEV1/FVC ratio: 95 %
Post FEV6/FVC ratio: 100 %
Pre FEV1/FVC ratio: 94 %
Pre FEV6/FVC Ratio: 100 %
RV % pred: 61 %
RV: 1.31 L
TLC % pred: 65 %
TLC: 3.68 L

## 2021-05-17 LAB — SARS CORONAVIRUS 2 (TAT 6-24 HRS): SARS Coronavirus 2: NEGATIVE

## 2021-05-17 NOTE — Progress Notes (Signed)
PFT done today. 

## 2021-05-23 DIAGNOSIS — N61 Mastitis without abscess: Secondary | ICD-10-CM | POA: Diagnosis not present

## 2021-05-23 DIAGNOSIS — E1165 Type 2 diabetes mellitus with hyperglycemia: Secondary | ICD-10-CM | POA: Diagnosis not present

## 2021-05-23 DIAGNOSIS — Z794 Long term (current) use of insulin: Secondary | ICD-10-CM | POA: Diagnosis not present

## 2021-05-24 ENCOUNTER — Ambulatory Visit (INDEPENDENT_AMBULATORY_CARE_PROVIDER_SITE_OTHER): Payer: Medicare Other | Admitting: Emergency Medicine

## 2021-05-24 ENCOUNTER — Other Ambulatory Visit: Payer: Self-pay

## 2021-05-24 ENCOUNTER — Encounter: Payer: Self-pay | Admitting: Emergency Medicine

## 2021-05-24 DIAGNOSIS — R053 Chronic cough: Secondary | ICD-10-CM

## 2021-05-24 DIAGNOSIS — J4520 Mild intermittent asthma, uncomplicated: Secondary | ICD-10-CM | POA: Diagnosis not present

## 2021-05-24 NOTE — Assessment & Plan Note (Signed)
Your pulmonary function testing does not show any evidence for significant asthma.  This is good news. We will not restart Advair at this time. You can keep your albuterol available to use 2 puffs when needed for shortness of breath, chest tightness, wheezing. Restart your allergy nasal spray (fluticasone/Flonase) if you begin to have more congestion or coughing. Follow Breanna Berger in 1 year or sooner if you have any flaring cough.

## 2021-05-24 NOTE — Patient Instructions (Signed)
Your pulmonary function testing does not show any evidence for significant asthma.  This is good news. We will not restart Advair at this time. You can keep your albuterol available to use 2 puffs when needed for shortness of breath, chest tightness, wheezing. Restart your allergy nasal spray (fluticasone/Flonase) if you begin to have more congestion or coughing. Follow Dr. Lamonte Sakai in 1 year or sooner if you have any flaring cough.

## 2021-05-24 NOTE — Assessment & Plan Note (Signed)
Unclear that she has true asthma.  Spirometry remains reassuring, consistent only with restriction.  No indication to restart Advair.  She can keep albuterol available to use if needed.

## 2021-05-24 NOTE — Progress Notes (Signed)
Subjective:    Patient ID: Breanna Berger, female    DOB: 01-29-1961, 60 y.o.   MRN: [id number]  HPI 60 year old former smoker (7 pack years) with a history of diabetes, hyperlipidemia, mitral valve prolapse, OSA not on CPAP, mild persistent asthma for which she has been on albuterol. She is here today to evaluate chronic cough and dyspnea.  She has had COVID twice, including about 3 months ago. She has kept a cough since then. Dry cough. Can happen through the day, no clear triggers. She can have GERD in the evenings, uses TUMS but not often. Her hyperglycemia has been very labile. She has some PND, has daily allergy sx, mainly eye running.  Currently managed on Advair that she only uses during flaring . She uses albuterol rarely.  Looks like she is on lisinopril, although she is not clear about this - says she is not taking. Losartan is also on her list, she says she is not taking.    Pulmonary function testing performed 09/16/2013 reviewed by me, shows principally restriction, possible obstruction based on a bronchodilator response 25-75% (unclear), decrease diffusion capacity that corrects to the normal range when adjusted for alveolar volume.   ROV 05/24/21 --follow-up visit 60 year old woman with a history of minimal tobacco use, OSA not on CPAP.  She has chronic cough principally due to upper airway irritation but with possible mild intermittent asthma.  Spirometry has overall been reassuring.  At our initial visit last month I stopped her Advair to see if this would help with cough.  Arrange for pulmonary function testing as below.  She was unsure whether she was still on lisinopril but recommended that she stop it if so. She states that she was not on the lisinopril. She has albuterol but uses it rarely.  She is coughing less. Can still happen sometimes. Not using flonase, not on flonase.   Pulmonary function testing from 05/17/2021 reviewed by me, shows restriction without a  bronchodilator response.  No evidence for obstruction.  Lung volumes are restricted.  Diffusion capacity decreased but corrects to the normal range when adjusted for alveolar volume.   Review of Systems As per HPI   Past Medical History:  Diagnosis Date   Abdominal pain, unspecified site 08/24/2013   Diabetes mellitus    Dyspnea 08/24/2013   HLD (hyperlipidemia)    MVP (mitral valve prolapse)    Sinusitis    Suppurative hidradenitis 07/27/2013   SVT (supraventricular tachycardia) ([place]) 09/09/2014   converted with vagal    UTI (lower urinary tract infection) 07/27/2013     Family History  Problem Relation Age of Onset   Diabetes Paternal Grandmother    Heart disease Paternal Aunt    Clotting disorder Mother    Lymphoma Mother    Stomach cancer Maternal Aunt    Colon cancer Maternal Aunt    Prostate cancer Maternal Uncle    Heart disease Paternal Grandfather    Coronary artery disease Paternal Grandfather    Lung cancer Maternal Uncle      Social History   Socioeconomic History   Marital status: Single    Spouse name: Not on file   Number of children: 2   Years of education: Not on file   Highest education level: Not on file  Occupational History   Occupation: Unemployed  Tobacco Use   Smoking status: Former    Years: 15.00    Types: Cigarettes    Quit date: 05/12/2015    Years since quitting: 6.0  Smokeless tobacco: Never  Substance and Sexual Activity   Alcohol use: No    Alcohol/week: 0.0 standard drinks   Drug use: No   Sexual activity: Yes    Birth control/protection: Surgical  Other Topics Concern   Not on file  Social History Narrative   Not on file   Social Determinants of Health   Financial Resource Strain: Not on file  Food Insecurity: Not on file  Transportation Needs: Not on file  Physical Activity: Not on file  Stress: Not on file  Social Connections: Not on file  Intimate Partner Violence: Not on file    Has worked in a lab setting,  some chemical exposures Has lived in [place], [place]  Allergies  Allergen Reactions   Erythromycin Hives, Itching and Swelling   Keflex [Cephalexin] Hives, Itching and Swelling   Doxycycline Hyclate Rash     Outpatient Medications Prior to Visit  Medication Sig Dispense Refill   albuterol (PROVENTIL HFA;VENTOLIN HFA) 108 (90 BASE) MCG/ACT inhaler Inhale 2 puffs into the lungs every 6 (six) hours as needed for wheezing or shortness of breath. 18 g 3   aspirin 81 MG chewable tablet aspirin 81 mg chewable tablet     atorvastatin (LIPITOR) 20 MG tablet Take 1 tablet by mouth at bedtime.     atorvastatin (LIPITOR) 40 MG tablet Take 40 mg by mouth every evening.  0   azithromycin (ZITHROMAX) 250 MG tablet TAKE 2 TABLETS BY MOUTH ON DAY 1, AND THEN TAKE 1 TABLET BY MOUTH ONCE A DAY ON DAY 2 THROUGH DAY 5     BINAXNOW COVID-19 AG HOME TEST KIT See admin instructions.     Blood Glucose Monitoring Suppl (ACCU-CHEK GUIDE ME) w/Device KIT Use to check blood sugar 3 times a day 1 kit 0   Cholecalciferol 25 MCG (1000 UT) capsule Take by mouth.     cyclobenzaprine (FLEXERIL) 5 MG tablet cyclobenzaprine 5 mg tablet  TAKE 1 TO 2 TABLETS BY MOUTH THREE TIMES DAILY AS NEEDED FOR 10 DAYS. STOP GLIMEPERIDE.     diazepam (VALIUM) 10 MG tablet diazepam 10 mg tablet     diclofenac Sodium (VOLTAREN) 1 % GEL APPLY 4 GRAMS TO THE AFFECTED AREA BY TOPICAL ROUTE 4 TIMES PER DAY     Dulaglutide (TRULICITY) 3 MV/7.8IO SOPN Inject 0.5 mLs (3 mg total) into the skin once a week. 12 pen 3   fluconazole (DIFLUCAN) 150 MG tablet Take 150 mg by mouth once.     fluticasone (FLONASE) 50 MCG/ACT nasal spray Place into the nose.     furosemide (LASIX) 20 MG tablet Take by mouth.     gabapentin (NEURONTIN) 300 MG capsule gabapentin 300 mg capsule     glimepiride (AMARYL) 4 MG tablet Take 2 mg by mouth 2 (two) times daily.     glipiZIDE (GLUCOTROL XL) 10 MG 24 hr tablet glipizide ER 10 mg tablet, extended release 24 hr  TAKE 1 TABLET BY  MOUTH ONCE DAILY     glucose blood (ACCU-CHEK GUIDE) test strip Use to check blood sugar 3 times a day. 300 each 12   HUMULIN N 100 UNIT/ML injection SMARTSIG:2-15 Unit(s) SUB-Q 3 Times Daily     HYDROcodone-acetaminophen (NORCO) 10-325 MG tablet TAKE 1 TABLET BY MOUTH UP TO FIVE TIMES DAILY IF NEEDED     HYDROcodone-acetaminophen (NORCO) 7.5-325 MG tablet hydrocodone 7.5 mg-acetaminophen 325 mg tablet  TAKE 1 TABLET BY MOUTH EVERY 8 HOURS AS NEEDED     ibuprofen (ADVIL) 800  MG tablet Take 800 mg by mouth 3 (three) times daily.     icosapent Ethyl (VASCEPA) 1 g capsule icosapent ethyl 1 gram capsule  TAKE 2 CAPSULES BY MOUTH TWICE DAILY FOR 30 DAYS     insulin lispro (HUMALOG) 100 UNIT/ML KwikPen SMARTSIG:2-15 Unit(s) SUB-Q 3 Times Daily     LANTUS SOLOSTAR 100 UNIT/ML Solostar Pen Inject 30 Units into the skin at bedtime. 5 pen 3   lidocaine (LIDODERM) 5 % Place 1 patch onto the skin daily. Remove & Discard patch within 12 hours or as directed by MD 30 patch 0   LINZESS 290 MCG CAPS capsule Take 290 mcg by mouth daily.     liraglutide (VICTOZA) 18 MG/3ML SOPN Victoza 3-Pak 0.6 mg/0.1 mL (18 mg/3 mL) subcutaneous pen injector     losartan (COZAAR) 25 MG tablet Take 12.5 mg by mouth daily.     meloxicam (MOBIC) 15 MG tablet Take 1 tablet by mouth every morning.  0   meloxicam (MOBIC) 7.5 MG tablet Take 1 tablet (7.5 mg total) by mouth daily as needed for pain. 14 tablet 0   metFORMIN (GLUCOPHAGE) 1000 MG tablet Take 1 tablet (1,000 mg total) by mouth 2 (two) times daily with a meal. 60 tablet 0   metoprolol succinate (TOPROL-XL) 50 MG 24 hr tablet Take 50 mg by mouth daily. Take with or immediately following a meal.     montelukast (SINGULAIR) 10 MG tablet montelukast 10 mg tablet  TAKE 1 TABLET BY MOUTH ONCE DAILY FOR 30 DAYS     nitroGLYCERIN (NITROSTAT) 0.4 MG SL tablet SMARTSIG:1 Sublingual Every Night     NONFORMULARY OR COMPOUNDED ITEM Antifungal solution: Terbinafine 3%, Fluconazole 2%,  Tea Tree Oil 5%, Urea 10%, Ibuprofen 2% in DMSO suspension #48mL 1 each 3   Olopatadine HCl 0.2 % SOLN olopatadine 0.2 % eye drops     oseltamivir (TAMIFLU) 75 MG capsule oseltamivir 75 mg capsule     oxyCODONE-acetaminophen (PERCOCET) 7.5-325 MG tablet Take 1 tablet by mouth 4 (four) times daily.     predniSONE (DELTASONE) 20 MG tablet Take 20 mg by mouth daily.     promethazine-dextromethorphan (PROMETHAZINE-DM) 6.25-15 MG/5ML syrup promethazine-DM 6.25 mg-15 mg/5 mL oral syrup     RELION PEN NEEDLES 32G X 4 MM MISC      spironolactone (ALDACTONE) 50 MG tablet spironolactone 50 mg tablet     tiZANidine (ZANAFLEX) 4 MG tablet Take by mouth.     Fluticasone-Salmeterol (ADVAIR) 100-50 MCG/DOSE AEPB INHALE 1 DOSE BY MOUTH TWICE DAILY     lisinopril (ZESTRIL) 10 MG tablet Take 1 tablet by mouth daily.     cephALEXin (KEFLEX) 500 MG capsule cephalexin 500 mg capsule (Patient not taking: Reported on 05/24/2021)     No facility-administered medications prior to visit.         Objective:   Physical Exam Vitals:   05/24/21 1527  BP: 126/78  Pulse: 82  Temp: 98.4 F (36.9 C)  TempSrc: Oral  SpO2: 100%  Weight: 285 lb 12.8 oz (129.6 kg)  Height: $Remove'5\' 9"'gidHaOn$  (1.753 m)    Gen: Pleasant, obese woman, in no distress,  normal affect  ENT: No lesions,  mouth clear,  oropharynx clear, no postnasal drip  Neck: No JVD, no stridor  Lungs: No use of accessory muscles, no crackles or wheezing on normal respiration, no wheeze on forced expiration  Cardiovascular: RRR, heart sounds normal, no murmur or gallops, no peripheral edema  Musculoskeletal: No deformities, no cyanosis or  clubbing  Neuro: alert, awake, non focal  Skin: Warm, no lesions or rash      Assessment & Plan:  Asthma Unclear that she has true asthma.  Spirometry remains reassuring, consistent only with restriction.  No indication to restart Advair.  She can keep albuterol available to use if needed.  Chronic cough Your  pulmonary function testing does not show any evidence for significant asthma.  This is good news. We will not restart Advair at this time. You can keep your albuterol available to use 2 puffs when needed for shortness of breath, chest tightness, wheezing. Restart your allergy nasal spray (fluticasone/Flonase) if you begin to have more congestion or coughing. Follow Dr. Lamonte Sakai in 1 year or sooner if you have any flaring cough.   Baltazar Apo, MD, PhD 05/24/2021, 3:41 PM [facility] [phone] or if no answer before 7:00PM call [phone] For any issues after 7:00PM please call eLink [phone]

## 2021-05-29 ENCOUNTER — Emergency Department (HOSPITAL_BASED_OUTPATIENT_CLINIC_OR_DEPARTMENT_OTHER)
Admission: EM | Admit: 2021-05-29 | Discharge: 2021-05-29 | Disposition: A | Discharge status: Home / Self Care | Payer: Medicare Other | Attending: Emergency Medicine | Admitting: Emergency Medicine

## 2021-05-29 ENCOUNTER — Encounter (HOSPITAL_BASED_OUTPATIENT_CLINIC_OR_DEPARTMENT_OTHER): Payer: Self-pay | Admitting: Emergency Medicine

## 2021-05-29 ENCOUNTER — Ambulatory Visit
Admission: EM | Admit: 2021-05-29 | Discharge: 2021-05-29 | Disposition: A | Discharge status: Home / Self Care | Payer: Medicare Other

## 2021-05-29 ENCOUNTER — Emergency Department (HOSPITAL_BASED_OUTPATIENT_CLINIC_OR_DEPARTMENT_OTHER): Payer: Medicare Other | Admitting: Radiology

## 2021-05-29 ENCOUNTER — Other Ambulatory Visit: Payer: Self-pay

## 2021-05-29 DIAGNOSIS — E119 Type 2 diabetes mellitus without complications: Secondary | ICD-10-CM | POA: Diagnosis not present

## 2021-05-29 DIAGNOSIS — Z7982 Long term (current) use of aspirin: Secondary | ICD-10-CM | POA: Insufficient documentation

## 2021-05-29 DIAGNOSIS — J45909 Unspecified asthma, uncomplicated: Secondary | ICD-10-CM | POA: Diagnosis not present

## 2021-05-29 DIAGNOSIS — Z87891 Personal history of nicotine dependence: Secondary | ICD-10-CM | POA: Insufficient documentation

## 2021-05-29 DIAGNOSIS — S40852A Superficial foreign body of left upper arm, initial encounter: Secondary | ICD-10-CM | POA: Diagnosis not present

## 2021-05-29 DIAGNOSIS — Z7984 Long term (current) use of oral hypoglycemic drugs: Secondary | ICD-10-CM | POA: Insufficient documentation

## 2021-05-29 DIAGNOSIS — Z79899 Other long term (current) drug therapy: Secondary | ICD-10-CM | POA: Diagnosis not present

## 2021-05-29 DIAGNOSIS — M61431 Other calcification of muscle, right forearm: Secondary | ICD-10-CM | POA: Diagnosis not present

## 2021-05-29 DIAGNOSIS — L0291 Cutaneous abscess, unspecified: Secondary | ICD-10-CM

## 2021-05-29 DIAGNOSIS — Z794 Long term (current) use of insulin: Secondary | ICD-10-CM | POA: Insufficient documentation

## 2021-05-29 DIAGNOSIS — Z7951 Long term (current) use of inhaled steroids: Secondary | ICD-10-CM | POA: Diagnosis not present

## 2021-05-29 DIAGNOSIS — S40851A Superficial foreign body of right upper arm, initial encounter: Secondary | ICD-10-CM | POA: Diagnosis not present

## 2021-05-29 DIAGNOSIS — M61432 Other calcification of muscle, left forearm: Secondary | ICD-10-CM | POA: Diagnosis not present

## 2021-05-29 DIAGNOSIS — N611 Abscess of the breast and nipple: Secondary | ICD-10-CM | POA: Diagnosis not present

## 2021-05-29 DIAGNOSIS — I5033 Acute on chronic diastolic (congestive) heart failure: Secondary | ICD-10-CM | POA: Insufficient documentation

## 2021-05-29 LAB — CBG MONITORING, ED: Glucose-Capillary: 178 mg/dL — ABNORMAL HIGH (ref 70–99)

## 2021-05-29 NOTE — ED Triage Notes (Signed)
Pt states she thinks she has 2 needles in each of her upper arms from her [place] device.  CBG at triage 178. Pt has not been able to check her CBG today. No other complaints.

## 2021-05-29 NOTE — ED Notes (Signed)
Pt refused dc VS. States she needs to go, does not want to wait any longer.
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
## 2021-06-01 DIAGNOSIS — M79676 Pain in unspecified toe(s): Secondary | ICD-10-CM

## 2021-06-16 DIAGNOSIS — M722 Plantar fascial fibromatosis: Secondary | ICD-10-CM | POA: Diagnosis not present

## 2021-06-16 DIAGNOSIS — G894 Chronic pain syndrome: Secondary | ICD-10-CM | POA: Diagnosis not present

## 2021-06-16 DIAGNOSIS — M170 Bilateral primary osteoarthritis of knee: Secondary | ICD-10-CM | POA: Diagnosis not present

## 2021-06-16 DIAGNOSIS — S83282A Other tear of lateral meniscus, current injury, left knee, initial encounter: Secondary | ICD-10-CM | POA: Diagnosis not present

## 2021-06-29 DIAGNOSIS — E1165 Type 2 diabetes mellitus with hyperglycemia: Secondary | ICD-10-CM | POA: Diagnosis not present

## 2021-06-29 DIAGNOSIS — E782 Mixed hyperlipidemia: Secondary | ICD-10-CM | POA: Diagnosis not present

## 2021-06-29 DIAGNOSIS — R051 Acute cough: Secondary | ICD-10-CM | POA: Diagnosis not present

## 2021-06-29 DIAGNOSIS — J209 Acute bronchitis, unspecified: Secondary | ICD-10-CM | POA: Diagnosis not present

## 2021-06-29 DIAGNOSIS — Z794 Long term (current) use of insulin: Secondary | ICD-10-CM | POA: Diagnosis not present

## 2021-06-29 DIAGNOSIS — I10 Essential (primary) hypertension: Secondary | ICD-10-CM | POA: Diagnosis not present

## 2021-07-27 ENCOUNTER — Encounter: Payer: Self-pay | Admitting: Internal Medicine

## 2021-07-27 ENCOUNTER — Ambulatory Visit (INDEPENDENT_AMBULATORY_CARE_PROVIDER_SITE_OTHER): Payer: Medicare Other | Admitting: Internal Medicine

## 2021-07-27 ENCOUNTER — Other Ambulatory Visit: Payer: Self-pay

## 2021-07-27 VITALS — BP 130/80 | HR 73 | Ht 69.0 in | Wt 280.0 lb

## 2021-07-27 DIAGNOSIS — R7989 Other specified abnormal findings of blood chemistry: Secondary | ICD-10-CM | POA: Diagnosis not present

## 2021-07-27 DIAGNOSIS — E1142 Type 2 diabetes mellitus with diabetic polyneuropathy: Secondary | ICD-10-CM | POA: Diagnosis not present

## 2021-07-27 DIAGNOSIS — Z794 Long term (current) use of insulin: Secondary | ICD-10-CM

## 2021-07-27 DIAGNOSIS — E1165 Type 2 diabetes mellitus with hyperglycemia: Secondary | ICD-10-CM | POA: Diagnosis not present

## 2021-07-27 DIAGNOSIS — Z1231 Encounter for screening mammogram for malignant neoplasm of breast: Secondary | ICD-10-CM | POA: Diagnosis not present

## 2021-07-27 DIAGNOSIS — E782 Mixed hyperlipidemia: Secondary | ICD-10-CM | POA: Diagnosis not present

## 2021-07-27 LAB — COMPREHENSIVE METABOLIC PANEL
ALT: 21 U/L (ref 0–35)
AST: 18 U/L (ref 0–37)
Albumin: 4 g/dL (ref 3.5–5.2)
Alkaline Phosphatase: 86 U/L (ref 39–117)
BUN: 9 mg/dL (ref 6–23)
CO2: 32 mEq/L (ref 19–32)
Calcium: 9.7 mg/dL (ref 8.4–10.5)
Chloride: 102 mEq/L (ref 96–112)
Creatinine, Ser: 0.65 mg/dL (ref 0.40–1.20)
GFR: 97.03 mL/min (ref >60.00)
Glucose, Bld: 282 mg/dL — ABNORMAL HIGH (ref 70–99)
Potassium: 4.4 mEq/L (ref 3.5–5.1)
Sodium: 138 mEq/L (ref 135–145)
Total Bilirubin: 0.5 mg/dL (ref 0.2–1.2)
Total Protein: 6.7 g/dL (ref 6.0–8.3)

## 2021-07-27 LAB — MICROALBUMIN / CREATININE URINE RATIO
Creatinine,U: 146 mg/dL
Microalb Creat Ratio: 8.9 mg/g (ref 0.0–30.0)
Microalb, Ur: 13 mg/dL — ABNORMAL HIGH (ref 0.0–1.9)

## 2021-07-27 LAB — LIPID PANEL
Cholesterol: 125 mg/dL (ref 0–200)
HDL: 43.3 mg/dL (ref >39.00)
LDL Cholesterol: 44 mg/dL (ref 0–99)
NonHDL: 81.83
Total CHOL/HDL Ratio: 3
Triglycerides: 191 mg/dL — ABNORMAL HIGH (ref 0.0–149.0)
VLDL: 38.2 mg/dL (ref 0.0–40.0)

## 2021-07-27 LAB — POCT GLUCOSE (DEVICE FOR HOME USE): Glucose Fasting, POC: 269 mg/dL — AB (ref 70–99)

## 2021-07-27 LAB — POCT GLYCOSYLATED HEMOGLOBIN (HGB A1C): Hemoglobin A1C: 9.2 % — AB (ref 4.0–5.6)

## 2021-07-27 MED ORDER — FREESTYLE LIBRE 2 SENSOR MISC: 1.0000 | 3 refills | Status: DC

## 2021-07-27 MED ORDER — METFORMIN HCL 1000 MG PO TABS: 1000.0000 mg | ORAL_TABLET | Freq: Two times a day (BID) | ORAL | 3 refills | Status: DC

## 2021-07-27 MED ORDER — FREESTYLE LIBRE 2 READER DEVI: 1.0000 | 0 refills | Status: DC

## 2021-07-27 MED ORDER — BD PEN NEEDLE MICRO U/F 32G X 6 MM MISC: 1.0000 | Freq: Every day | 3 refills | Status: DC

## 2021-07-27 MED ORDER — TIRZEPATIDE 2.5 MG/0.5ML ~~LOC~~ SOAJ: 2.5000 mg | SUBCUTANEOUS | 3 refills | Status: DC

## 2021-07-27 MED ORDER — LANTUS SOLOSTAR 100 UNIT/ML ~~LOC~~ SOPN: 50.0000 [IU] | PEN_INJECTOR | Freq: Every day | SUBCUTANEOUS | 6 refills | Status: DC

## 2021-07-27 NOTE — Progress Notes (Signed)
Name: Breanna Berger  MRN/ DOB: [id number], 1960/06/16   Age/ Sex: 61 y.o., female    PCP: Trey Sailors, PA   Reason for Endocrinology Evaluation: Type 2 Diabetes Mellitus     Date of Initial Endocrinology Visit: 07/27/2021     PATIENT IDENTIFIER: Breanna Berger is a 61 y.o. female with a past medical history of T2DM, dyslipidemia, CHF. The patient presented for initial endocrinology clinic visit on 07/27/2021 for consultative assistance with her diabetes management.    HPI: [name] was    Diagnosed with DM 2008 Prior Medications tried/Intolerance: As listed Currently checking blood sugars multiple  x / day,  through CGM until recently when she got frustrated with it  Hypoglycemia episodes : no               Hemoglobin A1c has ranged from 6.8% in 2010, peaking at 9.8% in 2013. Patient required assistance for hypoglycemia: no Patient has required hospitalization within the last 1 year from hyper or hypoglycemia: no  In terms of diet, the patient eats 2 meals a day, snacks 2x . Drinks tea and coffee as well as juice    HOME DIABETES REGIMEN: Trulicity 3 mg weekly - not taking  Metformin 1000 mg BID  Lantus 40 units daily    Statin: yes ACE-I/ARB: yes Prior Diabetic Education: no    METER DOWNLOAD SUMMARY:  DIABETIC COMPLICATIONS: Microvascular complications:  Neuropathy Denies: CKD, retinopathy  Last eye exam: Completed > 1 ago   Macrovascular complications:  CHF Denies: CAD, PVD, CVA   PAST HISTORY: Past Medical History:  Past Medical History:  Diagnosis Date   Abdominal pain, unspecified site 08/24/2013   Diabetes mellitus    Dyspnea 08/24/2013   HLD (hyperlipidemia)    MVP (mitral valve prolapse)    Sinusitis    Suppurative hidradenitis 07/27/2013   SVT (supraventricular tachycardia) ([place]) 09/09/2014   converted with vagal    UTI (lower urinary tract infection) 07/27/2013   Past Surgical History:  Past Surgical History:  Procedure  Laterality Date   CESAREAN SECTION     CESAREAN SECTION WITH BILATERAL TUBAL LIGATION     CHOLECYSTECTOMY     HERNIA REPAIR     UMBILICAL HERNIA REPAIR      Social History:  reports that she quit smoking about 6 years ago. Her smoking use included cigarettes. She has never used smokeless tobacco. She reports that she does not drink alcohol and does not use drugs. Family History:  Family History  Problem Relation Age of Onset   Diabetes Paternal Grandmother    Heart disease Paternal Aunt    Clotting disorder Mother    Lymphoma Mother    Stomach cancer Maternal Aunt    Colon cancer Maternal Aunt    Prostate cancer Maternal Uncle    Heart disease Paternal Grandfather    Coronary artery disease Paternal Grandfather    Lung cancer Maternal Uncle      HOME MEDICATIONS: Allergies as of 07/27/2021       Reactions   Erythromycin Hives, Itching, Swelling   Keflex [cephalexin] Hives, Itching, Swelling   Doxycycline Hyclate Rash        Medication List        Accurate as of July 27, 2021  9:11 AM. If you have any questions, ask your nurse or doctor.          STOP taking these medications    azithromycin 250 MG tablet Commonly known as: ZITHROMAX Stopped by: Secundino Ginger  Hersh Minney, MD   cephALEXin 500 MG capsule Commonly known as: KEFLEX Stopped by: Dorita Sciara, MD   cyclobenzaprine 5 MG tablet Commonly known as: FLEXERIL Stopped by: Dorita Sciara, MD   fluconazole 150 MG tablet Commonly known as: DIFLUCAN Stopped by: Dorita Sciara, MD   gabapentin 300 MG capsule Commonly known as: NEURONTIN Stopped by: Dorita Sciara, MD   glimepiride 4 MG tablet Commonly known as: AMARYL Stopped by: Dorita Sciara, MD   glipiZIDE 10 MG 24 hr tablet Commonly known as: GLUCOTROL XL Stopped by: Dorita Sciara, MD   HumuLIN N 100 UNIT/ML injection Generic drug: insulin NPH Human Stopped by: Dorita Sciara, MD   ibuprofen  800 MG tablet Commonly known as: ADVIL Stopped by: Dorita Sciara, MD   insulin lispro 100 UNIT/ML KwikPen Commonly known as: HUMALOG Stopped by: Dorita Sciara, MD   lidocaine 5 % Commonly known as: Lidoderm Stopped by: Dorita Sciara, MD   oseltamivir 75 MG capsule Commonly known as: TAMIFLU Stopped by: Dorita Sciara, MD   predniSONE 20 MG tablet Commonly known as: DELTASONE Stopped by: Dorita Sciara, MD   promethazine-dextromethorphan 6.25-15 MG/5ML syrup Commonly known as: PROMETHAZINE-DM Stopped by: Dorita Sciara, MD   spironolactone 50 MG tablet Commonly known as: ALDACTONE Stopped by: Dorita Sciara, MD   tiZANidine 4 MG tablet Commonly known as: ZANAFLEX Stopped by: Dorita Sciara, MD   Trulicity 3 HK/3.2XM Sopn Generic drug: Dulaglutide Stopped by: Dorita Sciara, MD   Victoza 18 MG/3ML Sopn Generic drug: liraglutide Stopped by: Dorita Sciara, MD       TAKE these medications    Accu-Chek Guide Me w/Device Kit Use to check blood sugar 3 times a day   Accu-Chek Guide test strip Generic drug: glucose blood Use to check blood sugar 3 times a day.   albuterol 108 (90 Base) MCG/ACT inhaler Commonly known as: VENTOLIN HFA Inhale 2 puffs into the lungs every 6 (six) hours as needed for wheezing or shortness of breath.   aspirin 81 MG chewable tablet aspirin 81 mg chewable tablet   atorvastatin 20 MG tablet Commonly known as: LIPITOR Take 1 tablet by mouth at bedtime. What changed: Another medication with the same name was removed. Continue taking this medication, and follow the directions you see here. Changed by: Dorita Sciara, MD   BinaxNOW COVID-19 Ag Home Test Kit Generic drug: COVID-19 At Home Antigen Test See admin instructions.   Cholecalciferol 25 MCG (1000 UT) capsule Take by mouth.   diazepam 10 MG tablet Commonly known as: VALIUM diazepam 10 mg tablet    diclofenac Sodium 1 % Gel Commonly known as: VOLTAREN APPLY 4 GRAMS TO THE AFFECTED AREA BY TOPICAL ROUTE 4 TIMES PER DAY   fluticasone 50 MCG/ACT nasal spray Commonly known as: FLONASE Place into the nose.   furosemide 20 MG tablet Commonly known as: LASIX Take by mouth.   HYDROcodone-acetaminophen 10-325 MG tablet Commonly known as: NORCO TAKE 1 TABLET BY MOUTH UP TO FIVE TIMES DAILY IF NEEDED What changed: Another medication with the same name was removed. Continue taking this medication, and follow the directions you see here. Changed by: Dorita Sciara, MD   icosapent Ethyl 1 g capsule Commonly known as: VASCEPA icosapent ethyl 1 gram capsule  TAKE 2 CAPSULES BY MOUTH TWICE DAILY FOR 30 DAYS   Lantus SoloStar 100 UNIT/ML Solostar Pen Generic drug: insulin glargine Inject 30 Units into the  skin at bedtime. What changed: how much to take   Linzess 290 MCG Caps capsule Generic drug: linaclotide Take 290 mcg by mouth daily.   losartan 25 MG tablet Commonly known as: COZAAR Take 12.5 mg by mouth daily.   meloxicam 7.5 MG tablet Commonly known as: Mobic Take 1 tablet (7.5 mg total) by mouth daily as needed for pain. What changed: Another medication with the same name was removed. Continue taking this medication, and follow the directions you see here. Changed by: Dorita Sciara, MD   metFORMIN 1000 MG tablet Commonly known as: Glucophage Take 1 tablet (1,000 mg total) by mouth 2 (two) times daily with a meal.   metoprolol succinate 50 MG 24 hr tablet Commonly known as: TOPROL-XL Take 50 mg by mouth daily. Take with or immediately following a meal.   montelukast 10 MG tablet Commonly known as: SINGULAIR montelukast 10 mg tablet  TAKE 1 TABLET BY MOUTH ONCE DAILY FOR 30 DAYS   nitroGLYCERIN 0.4 MG SL tablet Commonly known as: NITROSTAT SMARTSIG:1 Sublingual Every Night   NONFORMULARY OR COMPOUNDED ITEM Antifungal solution: Terbinafine 3%,  Fluconazole 2%, Tea Tree Oil 5%, Urea 10%, Ibuprofen 2% in DMSO suspension #38m   Olopatadine HCl 0.2 % Soln olopatadine 0.2 % eye drops   oxyCODONE-acetaminophen 7.5-325 MG tablet Commonly known as: PERCOCET Take 1 tablet by mouth 4 (four) times daily.   ReliOn Pen Needles 32G X 4 MM Misc Generic drug: Insulin Pen Needle         ALLERGIES: Allergies  Allergen Reactions   Erythromycin Hives, Itching and Swelling   Keflex [Cephalexin] Hives, Itching and Swelling   Doxycycline Hyclate Rash     REVIEW OF SYSTEMS: A comprehensive ROS was conducted with the patient and is negative except as per HPI and below:  Review of Systems  Cardiovascular:  Positive for palpitations.     OBJECTIVE:   VITAL SIGNS: BP 130/80 (BP Location: Left Arm, Patient Position: Sitting, Cuff Size: Large)    Pulse 73    Ht _0  (1.753 m)    Wt 280 lb (127 kg)    SpO2 96%    BMI 41.35 kg/m    PHYSICAL EXAM:  General: Pt appears well and is in NAD  Hydration: Well-hydrated with moist mucous membranes and good skin turgor  HEENT: Head: Unremarkable with good dentition. Oropharynx clear without exudate.  Eyes: External eye exam normal without stare, lid lag or exophthalmos.  EOM intact.  PERRL.  Neck: General: Supple without adenopathy or carotid bruits. Thyroid: Thyroid size normal.  No goiter or nodules appreciated. No thyroid bruit.  Lungs: Clear with good BS bilat with no rales, rhonchi, or wheezes  Heart: RRR with normal S1 and S2 and no gallops; no murmurs; no rub  Abdomen: Normoactive bowel sounds, soft, nontender, without masses or organomegaly palpable  Extremities:  Lower extremities - Trace  pretibial edema.   Neuro: MS is good with appropriate affect, pt is alert and Ox3    DATA REVIEWED:  Lab Results  Component Value Date   HGBA1C 9.2 (A) 07/27/2021   HGBA1C 7.2 (A) 12/25/2019   HGBA1C 9.2 (A) 11/06/2019     Latest Reference Range & Units 07/27/21 09:51  Sodium 135 - 145  mEq/L 138  Potassium 3.5 - 5.1 mEq/L 4.4  Chloride 96 - 112 mEq/L 102  CO2 19 - 32 mEq/L 32  Glucose 70 - 99 mg/dL 282 (H)  BUN 6 - 23 mg/dL 9  Creatinine 0.40 -  1.20 mg/dL 0.65  Calcium 8.4 - 10.5 mg/dL 9.7  Alkaline Phosphatase 39 - 117 U/L 86  Albumin 3.5 - 5.2 g/dL 4.0  AST 0 - 37 U/L 18  ALT 0 - 35 U/L 21  Total Protein 6.0 - 8.3 g/dL 6.7  Total Bilirubin 0.2 - 1.2 mg/dL 0.5  GFR >60.00 mL/min 97.03    Latest Reference Range & Units 07/27/21 09:51  Total CHOL/HDL Ratio  3  Cholesterol 0 - 200 mg/dL 125  HDL Cholesterol >39.00 mg/dL 43.30  LDL (calc) 0 - 99 mg/dL 44  MICROALB/CREAT RATIO 0.0 - 30.0 mg/g 8.9  NonHDL  81.83  Triglycerides 0.0 - 149.0 mg/dL 191.0 (H)  VLDL 0.0 - 40.0 mg/dL 38.2    Latest Reference Range & Units 07/27/21 09:51  Creatinine,U mg/dL 146.0  Microalb, Ur 0.0 - 1.9 mg/dL 13.0 (H)  MICROALB/CREAT RATIO 0.0 - 30.0 mg/g 8.9            Latest Reference Range & Units 12/24/20 22:13  Sodium 135 - 145 mmol/L 135  Potassium 3.5 - 5.1 mmol/L 4.0  Chloride 98 - 111 mmol/L 101  CO2 22 - 32 mmol/L 25  Glucose 70 - 99 mg/dL 410 (H)  BUN 6 - 20 mg/dL 10  Creatinine 0.44 - 1.00 mg/dL 0.69  Calcium 8.9 - 10.3 mg/dL 9.6  Anion gap 5 - 15  9  GFR, Estimated >60 mL/min >60    In-office Bg 269 mg/dL    ASSESSMENT / PLAN / RECOMMENDATIONS:   1) Type 2 Diabetes Mellitus, poorly controlled, With neuropathic complications - Most recent A1c of 9.2%. Goal A1c < 7.0 %.    -I have discussed with the patient the pathophysiology of diabetes. We went over the natural progression of the disease. We talked about both insulin resistance and insulin deficiency. We stressed the importance of lifestyle changes including diet and exercise. I explained the complications associated with diabetes including retinopathy, nephropathy, neuropathy as well as increased risk of cardiovascular disease. We went over the benefit seen with glycemic control.   -I explained to the  patient that diabetic patients are at higher than normal risk for amputations. -The patient will referred to our RD for further education on low-carb diet -Discussed pharmacokinetics of basal/bolus insulin  -We also discussed avoiding sugar-sweetened beverages and snacks, when possible.  -She was on Trulicity but she could not remember why she is not on it anymore -She has no history of pancreatitis, we discussed starting Mounjaro, cautioned against GI side effects -We will increase insulin as below -She had freestyle libre 2 but her skin was getting irritated due to bruising, she would like to try it again we also discussed freestyle libre 3 but she would prefer 1 with a reader  MEDICATIONS: Start Mounjaro 2.5 mg weekly Continue metformin 1000 mg twice daily Increase Lantus to 50 units daily  EDUCATION / INSTRUCTIONS: BG monitoring instructions: Patient is instructed to check her blood sugars 3 times a day, before meals. Call [facility] if: BG persistently < 70  I reviewed the Rule of 15 for the treatment of hypoglycemia in detail with the patient. Literature supplied.   2) Diabetic complications:  Eye: Does not have known diabetic retinopathy.  Neuro/ Feet: Does  have known diabetic peripheral neuropathy. Renal: Patient does not have known baseline CKD. She is on an ACEI/ARB at present.  3) Elevated LFTs:   -Patient tells me she has been noted with elevated LFTs in the past, she had  labs done and shows that her numbers are trending down. -Patient is requesting a recheck -I suspect fatty liver -Repeat LFTs normal  4) Dyslipidemia:   -LDL normal, TG trended down from 332 mg/DL  Medication  Continue atorvastatin 20 mg daily  Follow-up in 3 months      Signed electronically by: Mack Guise, MD  [facility]  [facility] [address] [place], [place] [address] Phone: [phone] FAX: [phone]    CC: Trey Sailors, [place] [address] [place] [address] Phone: [phone]  Fax: [phone]    Return to Endocrinology clinic as below: Future Appointments  Date Time Provider [place]  08/01/2021  3:45 PM Trula Slade, DPM [facility]

## 2021-07-27 NOTE — Patient Instructions (Signed)
-   Start Mounjaro 2.5 mg weekly  - Continue Metformin 1000 mg twice daily  - Increase Lantus to 50 units daily    HOW TO TREAT LOW BLOOD SUGARS (Blood sugar LESS THAN 70 MG/DL) Please follow the RULE OF 15 for the treatment of hypoglycemia treatment (when your (blood sugars are less than 70 mg/dL)   STEP 1: Take 15 grams of carbohydrates when your blood sugar is low, which includes:  3-4 GLUCOSE TABS  OR 3-4 OZ OF JUICE OR REGULAR SODA OR ONE TUBE OF GLUCOSE GEL    STEP 2: RECHECK blood sugar in 15 MINUTES STEP 3: If your blood sugar is still low at the 15 minute recheck --> then, go back to STEP 1 and treat AGAIN with another 15 grams of carbohydrates.

## 2021-08-01 ENCOUNTER — Ambulatory Visit: Payer: Medicare Other | Admitting: Podiatry

## 2021-08-01 ENCOUNTER — Other Ambulatory Visit: Payer: Self-pay

## 2021-08-01 DIAGNOSIS — E1165 Type 2 diabetes mellitus with hyperglycemia: Secondary | ICD-10-CM

## 2021-08-01 DIAGNOSIS — M79674 Pain in right toe(s): Secondary | ICD-10-CM

## 2021-08-01 DIAGNOSIS — M7752 Other enthesopathy of left foot: Secondary | ICD-10-CM | POA: Diagnosis not present

## 2021-08-01 DIAGNOSIS — B351 Tinea unguium: Secondary | ICD-10-CM

## 2021-08-01 DIAGNOSIS — W172XXS Fall into hole, sequela: Secondary | ICD-10-CM | POA: Diagnosis not present

## 2021-08-01 DIAGNOSIS — M79675 Pain in left toe(s): Secondary | ICD-10-CM

## 2021-08-03 DIAGNOSIS — J4530 Mild persistent asthma, uncomplicated: Secondary | ICD-10-CM | POA: Diagnosis not present

## 2021-08-03 DIAGNOSIS — E1165 Type 2 diabetes mellitus with hyperglycemia: Secondary | ICD-10-CM | POA: Diagnosis not present

## 2021-08-03 DIAGNOSIS — Z794 Long term (current) use of insulin: Secondary | ICD-10-CM | POA: Diagnosis not present

## 2021-08-03 DIAGNOSIS — I10 Essential (primary) hypertension: Secondary | ICD-10-CM | POA: Diagnosis not present

## 2021-08-03 DIAGNOSIS — E782 Mixed hyperlipidemia: Secondary | ICD-10-CM | POA: Diagnosis not present

## 2021-08-06 NOTE — Progress Notes (Signed)
Subjective: 61 year old female presents the office today for follow-up evaluation of left foot pain.  She states that she still having pain to her foot which started after she had a fall.  She had no pain at the foot prior.  Originally she was seen at [facility] for her ankle.  She is continue to get discomfort to the foot which has been ongoing now for quite some time.  No new injuries.  Also asking for the nails be trimmed today as are thickened elongated she has difficulty trimming them herself.  No swelling or redness or any drainage to the tendon sites.  Objective: AAO x3, NAD DP/PT pulses palpable bilaterally, CRT less than 3 seconds On today's exam the majority of tenderness is localized on the midfoot area.  The pain is mostly along the interspaces of the metatarsals.  There is still some mild discomfort on the dorsal midfoot, Lisfranc joint but there is no specific area of pinpoint tenderness.  Flexor, extensor tendons appear to be intact.  MMT 5/5.   Nails are hypertrophic, dystrophic, brittle, discolored, elongated 10. No surrounding redness or drainage. Tenderness nails 1-5 bilaterally. No open lesions or pre-ulcerative lesions are identified today. No pain with calf compression, swelling, warmth, erythema  Assessment: Bursitis; symptomatic onychomycosis  Plan: -All treatment options discussed with the patient including all alternatives, risks, complications.  -Given ongoing foot pain I referred her to physical therapy specifically for the foot.  Continue with supportive shoe gear. -Sharply debrided the nails x10 without any complications or bleeding. -Patient encouraged to call the office with any questions, concerns, change in symptoms.   Trula Slade DPM

## 2021-08-07 DIAGNOSIS — M545 Low back pain, unspecified: Secondary | ICD-10-CM | POA: Diagnosis not present

## 2021-08-15 DIAGNOSIS — E1165 Type 2 diabetes mellitus with hyperglycemia: Secondary | ICD-10-CM | POA: Diagnosis not present

## 2021-08-15 DIAGNOSIS — Z794 Long term (current) use of insulin: Secondary | ICD-10-CM | POA: Diagnosis not present

## 2021-08-15 DIAGNOSIS — I10 Essential (primary) hypertension: Secondary | ICD-10-CM | POA: Diagnosis not present

## 2021-08-15 DIAGNOSIS — E782 Mixed hyperlipidemia: Secondary | ICD-10-CM | POA: Diagnosis not present

## 2021-08-15 DIAGNOSIS — J4530 Mild persistent asthma, uncomplicated: Secondary | ICD-10-CM | POA: Diagnosis not present

## 2021-08-17 ENCOUNTER — Ambulatory Visit: Payer: Medicare Other | Admitting: Skilled Nursing Facility1

## 2021-08-25 DIAGNOSIS — R262 Difficulty in walking, not elsewhere classified: Secondary | ICD-10-CM | POA: Diagnosis not present

## 2021-08-25 DIAGNOSIS — R531 Weakness: Secondary | ICD-10-CM | POA: Diagnosis not present

## 2021-08-25 DIAGNOSIS — M25372 Other instability, left ankle: Secondary | ICD-10-CM | POA: Diagnosis not present

## 2021-08-25 DIAGNOSIS — M25572 Pain in left ankle and joints of left foot: Secondary | ICD-10-CM | POA: Diagnosis not present

## 2021-08-25 DIAGNOSIS — M25675 Stiffness of left foot, not elsewhere classified: Secondary | ICD-10-CM | POA: Diagnosis not present

## 2021-08-28 ENCOUNTER — Ambulatory Visit: Payer: Medicare Other | Admitting: Nutrition

## 2021-09-13 DIAGNOSIS — G894 Chronic pain syndrome: Secondary | ICD-10-CM | POA: Diagnosis not present

## 2021-09-13 DIAGNOSIS — Z79891 Long term (current) use of opiate analgesic: Secondary | ICD-10-CM | POA: Diagnosis not present

## 2021-09-22 DIAGNOSIS — M25572 Pain in left ankle and joints of left foot: Secondary | ICD-10-CM | POA: Diagnosis not present

## 2021-09-22 DIAGNOSIS — M25372 Other instability, left ankle: Secondary | ICD-10-CM | POA: Diagnosis not present

## 2021-09-22 DIAGNOSIS — R262 Difficulty in walking, not elsewhere classified: Secondary | ICD-10-CM | POA: Diagnosis not present

## 2021-09-22 DIAGNOSIS — R531 Weakness: Secondary | ICD-10-CM | POA: Diagnosis not present

## 2021-09-22 DIAGNOSIS — M25675 Stiffness of left foot, not elsewhere classified: Secondary | ICD-10-CM | POA: Diagnosis not present

## 2021-10-03 ENCOUNTER — Ambulatory Visit (INDEPENDENT_AMBULATORY_CARE_PROVIDER_SITE_OTHER): Payer: Medicare Other | Admitting: Podiatry

## 2021-10-03 DIAGNOSIS — B351 Tinea unguium: Secondary | ICD-10-CM

## 2021-10-03 DIAGNOSIS — E1165 Type 2 diabetes mellitus with hyperglycemia: Secondary | ICD-10-CM | POA: Diagnosis not present

## 2021-10-03 DIAGNOSIS — M7752 Other enthesopathy of left foot: Secondary | ICD-10-CM | POA: Diagnosis not present

## 2021-10-03 DIAGNOSIS — M79674 Pain in right toe(s): Secondary | ICD-10-CM

## 2021-10-03 DIAGNOSIS — M79675 Pain in left toe(s): Secondary | ICD-10-CM

## 2021-10-03 NOTE — Patient Instructions (Signed)
You can use "urea nail gel" on the toenails to help with the thickening.  ?

## 2021-10-05 DIAGNOSIS — M25675 Stiffness of left foot, not elsewhere classified: Secondary | ICD-10-CM | POA: Diagnosis not present

## 2021-10-05 DIAGNOSIS — M25572 Pain in left ankle and joints of left foot: Secondary | ICD-10-CM | POA: Diagnosis not present

## 2021-10-05 DIAGNOSIS — R262 Difficulty in walking, not elsewhere classified: Secondary | ICD-10-CM | POA: Diagnosis not present

## 2021-10-05 DIAGNOSIS — M25372 Other instability, left ankle: Secondary | ICD-10-CM | POA: Diagnosis not present

## 2021-10-05 DIAGNOSIS — R531 Weakness: Secondary | ICD-10-CM | POA: Diagnosis not present

## 2021-10-10 NOTE — Progress Notes (Signed)
Subjective: ?61 year old female presents the office today for follow-up evaluation of left foot pain which started after she fell into the hole.  She just recently started physical therapy.  Some some discomfort with foot.  No new injuries or concerns otherwise.  ? ?Also asking for the nails be trimmed today as are thickened elongated she has difficulty trimming them herself.  No swelling or redness or any drainage to the tendon sites. ? ?Objective: ?AAO x3, NAD ?DP/PT pulses palpable bilaterally, CRT less than 3 seconds ?On today's exam the majority of tenderness is localized on the midfoot area.  The pain is mostly along the interspaces of the metatarsals.  There is still some mild discomfort on the dorsal midfoot, Lisfranc joint but there is no specific area of pinpoint tenderness.  Flexor, extensor tendons appear to be intact.  MMT 5/5.  Overall exam appears to be unchanged ?Nails are hypertrophic, dystrophic, brittle, discolored, elongated ?10. No surrounding redness or drainage. Tenderness nails 1-5 bilaterally. No open lesions or pre-ulcerative lesions are identified today. ?No pain with calf compression, swelling, warmth, erythema ? ?Assessment: ?Bursitis; symptomatic onychomycosis ? ?Plan: ?-All treatment options discussed with the patient including all alternatives, risks, complications.  ?-Continue physical therapy.  She is only been for 1 session so far outside of the evaluation.  Continue shoes with arch support as well. ?-Sharply debrided the nails x10 without any complications or bleeding. ?-Patient encouraged to call the office with any questions, concerns, change in symptoms.  ? ?Trula Slade DPM ? ?

## 2021-10-11 DIAGNOSIS — H40033 Anatomical narrow angle, bilateral: Secondary | ICD-10-CM | POA: Diagnosis not present

## 2021-10-11 DIAGNOSIS — E119 Type 2 diabetes mellitus without complications: Secondary | ICD-10-CM | POA: Diagnosis not present

## 2021-10-11 DIAGNOSIS — G894 Chronic pain syndrome: Secondary | ICD-10-CM | POA: Diagnosis not present

## 2021-10-12 DIAGNOSIS — R531 Weakness: Secondary | ICD-10-CM | POA: Diagnosis not present

## 2021-10-12 DIAGNOSIS — R262 Difficulty in walking, not elsewhere classified: Secondary | ICD-10-CM | POA: Diagnosis not present

## 2021-10-12 DIAGNOSIS — M25675 Stiffness of left foot, not elsewhere classified: Secondary | ICD-10-CM | POA: Diagnosis not present

## 2021-10-12 DIAGNOSIS — M25572 Pain in left ankle and joints of left foot: Secondary | ICD-10-CM | POA: Diagnosis not present

## 2021-10-12 DIAGNOSIS — M25372 Other instability, left ankle: Secondary | ICD-10-CM | POA: Diagnosis not present

## 2021-10-19 DIAGNOSIS — R262 Difficulty in walking, not elsewhere classified: Secondary | ICD-10-CM | POA: Diagnosis not present

## 2021-10-19 DIAGNOSIS — M25572 Pain in left ankle and joints of left foot: Secondary | ICD-10-CM | POA: Diagnosis not present

## 2021-10-19 DIAGNOSIS — M25675 Stiffness of left foot, not elsewhere classified: Secondary | ICD-10-CM | POA: Diagnosis not present

## 2021-10-19 DIAGNOSIS — M25372 Other instability, left ankle: Secondary | ICD-10-CM | POA: Diagnosis not present

## 2021-10-19 DIAGNOSIS — R531 Weakness: Secondary | ICD-10-CM | POA: Diagnosis not present

## 2021-10-25 ENCOUNTER — Encounter: Payer: Self-pay | Admitting: Internal Medicine

## 2021-10-25 ENCOUNTER — Ambulatory Visit (INDEPENDENT_AMBULATORY_CARE_PROVIDER_SITE_OTHER): Payer: Medicare Other | Admitting: Internal Medicine

## 2021-10-25 VITALS — BP 130/78 | HR 79 | Ht 69.0 in | Wt 279.0 lb

## 2021-10-25 DIAGNOSIS — E782 Mixed hyperlipidemia: Secondary | ICD-10-CM | POA: Diagnosis not present

## 2021-10-25 DIAGNOSIS — Z794 Long term (current) use of insulin: Secondary | ICD-10-CM | POA: Diagnosis not present

## 2021-10-25 DIAGNOSIS — E1165 Type 2 diabetes mellitus with hyperglycemia: Secondary | ICD-10-CM | POA: Diagnosis not present

## 2021-10-25 DIAGNOSIS — R739 Hyperglycemia, unspecified: Secondary | ICD-10-CM

## 2021-10-25 DIAGNOSIS — E1142 Type 2 diabetes mellitus with diabetic polyneuropathy: Secondary | ICD-10-CM

## 2021-10-25 LAB — POCT GLUCOSE (DEVICE FOR HOME USE): Glucose Fasting, POC: 150 mg/dL — AB (ref 70–99)

## 2021-10-25 LAB — POCT GLYCOSYLATED HEMOGLOBIN (HGB A1C): Hemoglobin A1C: 7.8 % — AB (ref 4.0–5.6)

## 2021-10-25 MED ORDER — METFORMIN HCL 1000 MG PO TABS: 1000.0000 mg | ORAL_TABLET | Freq: Two times a day (BID) | ORAL | 3 refills | Status: AC

## 2021-10-25 MED ORDER — LANTUS SOLOSTAR 100 UNIT/ML ~~LOC~~ SOPN: 36.0000 [IU] | PEN_INJECTOR | Freq: Every day | SUBCUTANEOUS | 6 refills | Status: AC

## 2021-10-25 MED ORDER — BD PEN NEEDLE MICRO U/F 32G X 6 MM MISC: 1.0000 | Freq: Every day | 3 refills | Status: AC

## 2021-10-25 MED ORDER — TIRZEPATIDE 5 MG/0.5ML ~~LOC~~ SOAJ: 5.0000 mg | SUBCUTANEOUS | 3 refills | Status: DC

## 2021-10-25 NOTE — Progress Notes (Signed)
Name: Breanna Berger  MRN/ DOB: [id number], [birth year]   Age/ Sex: 61 y.o., female    PCP: Trey Sailors, PA   Reason for Endocrinology Evaluation: Type 2 Diabetes Mellitus     Date of Initial Endocrinology Visit: 07/27/2021    PATIENT IDENTIFIER: Breanna Berger is a 61 y.o. female with a past medical history of T2DM, dyslipidemia, CHF. The patient presented for initial endocrinology clinic visit on 2/16/2023for consultative assistance with her diabetes management.    HPI: Breanna Berger was    Diagnosed with DM 2008 Prior Medications tried/Intolerance: As listed Currently checking blood sugars multiple  x / day,  through CGM until recently when she got frustrated with it        Hemoglobin A1c has ranged from 6.8% in 2010, peaking at 9.8% in 2013.    On her initial visit to our clinic she had an A1c of 9.2%, she was already on metformin and Lantus, we increased Lantus and started Mounjaro  SUBJECTIVE:   During the last visit (07/27/2021): A1c 9.2%, increase Lantus, continue metformin, and started Mounjaro  Today (10/25/21): Breanna Berger is here for follow-up on diabetes management.  She is  accompanied by her daughter today. She checks her blood sugars 1 times daily. The patient has not  had hypoglycemic episodes since the last clinic visit.   Denies nausea, vomiting or diarrhea but has bloating    HOME DIABETES REGIMEN: Metformin 1000 mg BID  Lantus 50 units daily - taking 40 units  Mounjaro 2.5 mg weekly   Statin: yes ACE-I/ARB: yes Prior Diabetic Education: no    METER DOWNLOAD SUMMARY:  DIABETIC COMPLICATIONS: Microvascular complications:  Neuropathy Denies: CKD, retinopathy  Last eye exam: Completed 10/2020  Macrovascular complications:  CHF Denies: CAD, PVD, CVA   PAST HISTORY: Past Medical History:  Past Medical History:  Diagnosis Date   Abdominal pain, unspecified site 08/24/2013   Diabetes mellitus    Dyspnea 08/24/2013   HLD  (hyperlipidemia)    MVP (mitral valve prolapse)    Sinusitis    Suppurative hidradenitis 07/27/2013   SVT (supraventricular tachycardia) ([place]) 09/09/2014   converted with vagal    UTI (lower urinary tract infection) 07/27/2013   Past Surgical History:  Past Surgical History:  Procedure Laterality Date   CESAREAN SECTION     CESAREAN SECTION WITH BILATERAL TUBAL LIGATION     CHOLECYSTECTOMY     HERNIA REPAIR     UMBILICAL HERNIA REPAIR      Social History:  reports that she quit smoking about 6 years ago. Her smoking use included cigarettes. She has never used smokeless tobacco. She reports that she does not drink alcohol and does not use drugs. Family History:  Family History  Problem Relation Age of Onset   Diabetes Paternal Grandmother    Heart disease Paternal Aunt    Clotting disorder Mother    Lymphoma Mother    Stomach cancer Maternal Aunt    Colon cancer Maternal Aunt    Prostate cancer Maternal Uncle    Heart disease Paternal Grandfather    Coronary artery disease Paternal Grandfather    Lung cancer Maternal Uncle      HOME MEDICATIONS: Allergies as of 10/25/2021       Reactions   Erythromycin Hives, Itching, Swelling   Keflex [cephalexin] Hives, Itching, Swelling   Doxycycline Hyclate Rash        Medication List        Accurate as of Oct 25, 2021  7:24  AM. If you have any questions, ask your nurse or doctor.          Accu-Chek Guide Me w/Device Kit Use to check blood sugar 3 times a day   Accu-Chek Guide test strip Generic drug: glucose blood Use to check blood sugar 3 times a day.   albuterol 108 (90 Base) MCG/ACT inhaler Commonly known as: VENTOLIN HFA Inhale 2 puffs into the lungs every 6 (six) hours as needed for wheezing or shortness of breath.   aspirin 81 MG chewable tablet aspirin 81 mg chewable tablet   atorvastatin 20 MG tablet Commonly known as: LIPITOR Take 1 tablet by mouth at bedtime.   BD Pen Needle Micro U/F 32G X 6 MM  Misc Generic drug: Insulin Pen Needle 1 Device by Does not apply route daily in the afternoon.   BinaxNOW COVID-19 Ag Home Test Kit Generic drug: COVID-19 At Home Antigen Test See admin instructions.   Cholecalciferol 25 MCG (1000 UT) capsule Take by mouth.   FreeStyle Libre 2 Sensor Misc 1 Device by Does not apply route every 14 (fourteen) days.   HYDROcodone-acetaminophen 10-325 MG tablet Commonly known as: NORCO TAKE 1 TABLET BY MOUTH UP TO FIVE TIMES DAILY IF NEEDED   icosapent Ethyl 1 g capsule Commonly known as: VASCEPA icosapent ethyl 1 gram capsule  TAKE 2 CAPSULES BY MOUTH TWICE DAILY FOR 30 DAYS   Lantus SoloStar 100 UNIT/ML Solostar Pen Generic drug: insulin glargine Inject 50 Units into the skin at bedtime.   Linzess 290 MCG Caps capsule Generic drug: linaclotide Take 290 mcg by mouth daily.   losartan 25 MG tablet Commonly known as: COZAAR Take 12.5 mg by mouth daily.   meloxicam 7.5 MG tablet Commonly known as: Mobic Take 1 tablet (7.5 mg total) by mouth daily as needed for pain.   metFORMIN 1000 MG tablet Commonly known as: Glucophage Take 1 tablet (1,000 mg total) by mouth 2 (two) times daily with a meal.   metoprolol succinate 50 MG 24 hr tablet Commonly known as: TOPROL-XL Take 50 mg by mouth daily. Take with or immediately following a meal.   montelukast 10 MG tablet Commonly known as: SINGULAIR montelukast 10 mg tablet  TAKE 1 TABLET BY MOUTH ONCE DAILY FOR 30 DAYS   nitroGLYCERIN 0.4 MG SL tablet Commonly known as: NITROSTAT SMARTSIG:1 Sublingual Every Night   NONFORMULARY OR COMPOUNDED ITEM Antifungal solution: Terbinafine 3%, Fluconazole 2%, Tea Tree Oil 5%, Urea 10%, Ibuprofen 2% in DMSO suspension #54mL   Olopatadine HCl 0.2 % Soln olopatadine 0.2 % eye drops   oxyCODONE-acetaminophen 7.5-325 MG tablet Commonly known as: PERCOCET Take 1 tablet by mouth 4 (four) times daily.   tirzepatide 2.5 MG/0.5ML Pen Commonly known as:  MOUNJARO Inject 2.5 mg into the skin once a week.         ALLERGIES: Allergies  Allergen Reactions   Erythromycin Hives, Itching and Swelling   Keflex [Cephalexin] Hives, Itching and Swelling   Doxycycline Hyclate Rash     REVIEW OF SYSTEMS: A comprehensive ROS was conducted with the patient and is negative except as per HPI and below:  Review of Systems  Cardiovascular:  Positive for palpitations.     OBJECTIVE:   VITAL SIGNS: There were no vitals taken for this visit.   PHYSICAL EXAM:  General: Pt appears well and is in NAD  Hydration: Well-hydrated with moist mucous membranes and good skin turgor  HEENT: Head: Unremarkable with good dentition. Oropharynx clear without exudate.  Eyes:  External eye exam normal without stare, lid lag or exophthalmos.  EOM intact.  PERRL.  Neck: General: Supple without adenopathy or carotid bruits. Thyroid: Thyroid size normal.  No goiter or nodules appreciated. No thyroid bruit.  Lungs: Clear with good BS bilat with no rales, rhonchi, or wheezes  Heart: RRR with normal S1 and S2 and no gallops; no murmurs; no rub  Abdomen: Normoactive bowel sounds, soft, nontender, without masses or organomegaly palpable  Extremities:  Lower extremities - Trace  pretibial edema.   Neuro: MS is good with appropriate affect, pt is alert and Ox3    DATA REVIEWED:  Lab Results  Component Value Date   HGBA1C 9.2 (A) 07/27/2021   HGBA1C 7.2 (A) 12/25/2019   HGBA1C 9.2 (A) 11/06/2019     Latest Reference Range & Units 07/27/21 09:51  Sodium 135 - 145 mEq/L 138  Potassium 3.5 - 5.1 mEq/L 4.4  Chloride 96 - 112 mEq/L 102  CO2 19 - 32 mEq/L 32  Glucose 70 - 99 mg/dL 282 (H)  BUN 6 - 23 mg/dL 9  Creatinine 0.40 - 1.20 mg/dL 0.65  Calcium 8.4 - 10.5 mg/dL 9.7  Alkaline Phosphatase 39 - 117 U/L 86  Albumin 3.5 - 5.2 g/dL 4.0  AST 0 - 37 U/L 18  ALT 0 - 35 U/L 21  Total Protein 6.0 - 8.3 g/dL 6.7  Total Bilirubin 0.2 - 1.2 mg/dL 0.5  GFR >60.00  mL/min 97.03    Latest Reference Range & Units 07/27/21 09:51  Total CHOL/HDL Ratio  3  Cholesterol 0 - 200 mg/dL 125  HDL Cholesterol >39.00 mg/dL 43.30  LDL (calc) 0 - 99 mg/dL 44  MICROALB/CREAT RATIO 0.0 - 30.0 mg/g 8.9  NonHDL  81.83  Triglycerides 0.0 - 149.0 mg/dL 191.0 (H)  VLDL 0.0 - 40.0 mg/dL 38.2    Latest Reference Range & Units 07/27/21 09:51  Creatinine,U mg/dL 146.0  Microalb, Ur 0.0 - 1.9 mg/dL 13.0 (H)  MICROALB/CREAT RATIO 0.0 - 30.0 mg/g 8.9            Latest Reference Range & Units 12/24/20 22:13  Sodium 135 - 145 mmol/L 135  Potassium 3.5 - 5.1 mmol/L 4.0  Chloride 98 - 111 mmol/L 101  CO2 22 - 32 mmol/L 25  Glucose 70 - 99 mg/dL 410 (H)  BUN 6 - 20 mg/dL 10  Creatinine 0.44 - 1.00 mg/dL 0.69  Calcium 8.9 - 10.3 mg/dL 9.6  Anion gap 5 - 15  9  GFR, Estimated >60 mL/min >60    In-office Bg 150 mg/dL    ASSESSMENT / PLAN / RECOMMENDATIONS:   1) Type 2 Diabetes Mellitus, with improving glycemic control, With neuropathic complications - Most recent A1c of 7.8 %. Goal A1c < 7.0 %.    -I have praised the patient improved glycemic control A1c down from 9.2% to 7.8% -I am going to increase Mounjaro and reduce Lantus to prevent hypoglycemia  MEDICATIONS: Increase Mounjaro 5 mg weekly Continue metformin 1000 mg twice daily Decrease Lantus to 36 units daily  EDUCATION / INSTRUCTIONS: BG monitoring instructions: Patient is instructed to check her blood sugars 3 times a day, before meals. Call [facility] if: BG persistently < 70  I reviewed the Rule of 15 for the treatment of hypoglycemia in detail with the patient. Literature supplied.   2) Diabetic complications:  Eye: Does not have known diabetic retinopathy.  Neuro/ Feet: Does  have known diabetic peripheral neuropathy. Renal: Patient does not have  known baseline CKD. She is on an ACEI/ARB at present.   3) Dyslipidemia:   -LDL at goal, TG have been elevated we will  continue to monitor  Medication  Continue atorvastatin 20 mg daily  Follow-up in 6 months      Signed electronically by: Mack Guise, MD  [facility]  [facility] [address] [place], [place] [address] Phone: [phone] FAX: [phone]   CC: Trey Sailors, [place] [address] [place] [address] Phone: [phone]  Fax: [phone]    Return to Endocrinology clinic as below: Future Appointments  Date Time Provider [place]  10/25/2021  9:30 AM Jackelynn Hosie, Melanie Crazier, MD [facility] None  01/04/2022 11:15 AM Trula Slade, DPM [facility]

## 2021-10-25 NOTE — Patient Instructions (Addendum)
-   Keep Up the Good Work ! A1c down from 9.2 % to 7.8 %  ? ?- INcrease Mounjaro 5 mg weekly  ?- Continue Metformin 1000 mg twice daily  ?- Decrease Lantus to 36 units daily  ? ? ?HOW TO TREAT LOW BLOOD SUGARS (Blood sugar LESS THAN 70 MG/DL) ?Please follow the RULE OF 15 for the treatment of hypoglycemia treatment (when your (blood sugars are less than 70 mg/dL)  ? ?STEP 1: Take 15 grams of carbohydrates when your blood sugar is low, which includes:  ?3-4 GLUCOSE TABS  OR ?3-4 OZ OF JUICE OR REGULAR SODA OR ?ONE TUBE OF GLUCOSE GEL   ? ?STEP 2: RECHECK blood sugar in 15 MINUTES ?STEP 3: If your blood sugar is still low at the 15 minute recheck --> then, go back to STEP 1 and treat AGAIN with another 15 grams of carbohydrates. ? ?

## 2021-10-26 DIAGNOSIS — M25572 Pain in left ankle and joints of left foot: Secondary | ICD-10-CM | POA: Diagnosis not present

## 2021-10-26 DIAGNOSIS — R262 Difficulty in walking, not elsewhere classified: Secondary | ICD-10-CM | POA: Diagnosis not present

## 2021-10-26 DIAGNOSIS — R531 Weakness: Secondary | ICD-10-CM | POA: Diagnosis not present

## 2021-10-26 DIAGNOSIS — M25675 Stiffness of left foot, not elsewhere classified: Secondary | ICD-10-CM | POA: Diagnosis not present

## 2021-10-26 DIAGNOSIS — M25372 Other instability, left ankle: Secondary | ICD-10-CM | POA: Diagnosis not present

## 2021-10-31 DIAGNOSIS — R531 Weakness: Secondary | ICD-10-CM | POA: Diagnosis not present

## 2021-10-31 DIAGNOSIS — R262 Difficulty in walking, not elsewhere classified: Secondary | ICD-10-CM | POA: Diagnosis not present

## 2021-10-31 DIAGNOSIS — M25675 Stiffness of left foot, not elsewhere classified: Secondary | ICD-10-CM | POA: Diagnosis not present

## 2021-10-31 DIAGNOSIS — M25572 Pain in left ankle and joints of left foot: Secondary | ICD-10-CM | POA: Diagnosis not present

## 2021-10-31 DIAGNOSIS — M25372 Other instability, left ankle: Secondary | ICD-10-CM | POA: Diagnosis not present

## 2021-11-14 DIAGNOSIS — M47816 Spondylosis without myelopathy or radiculopathy, lumbar region: Secondary | ICD-10-CM | POA: Diagnosis not present

## 2021-11-14 DIAGNOSIS — G894 Chronic pain syndrome: Secondary | ICD-10-CM | POA: Diagnosis not present

## 2021-11-14 DIAGNOSIS — Z79891 Long term (current) use of opiate analgesic: Secondary | ICD-10-CM | POA: Diagnosis not present

## 2021-11-24 DIAGNOSIS — M25372 Other instability, left ankle: Secondary | ICD-10-CM | POA: Diagnosis not present

## 2021-11-24 DIAGNOSIS — M25675 Stiffness of left foot, not elsewhere classified: Secondary | ICD-10-CM | POA: Diagnosis not present

## 2021-11-24 DIAGNOSIS — R262 Difficulty in walking, not elsewhere classified: Secondary | ICD-10-CM | POA: Diagnosis not present

## 2021-11-24 DIAGNOSIS — R531 Weakness: Secondary | ICD-10-CM | POA: Diagnosis not present

## 2021-11-24 DIAGNOSIS — M25572 Pain in left ankle and joints of left foot: Secondary | ICD-10-CM | POA: Diagnosis not present

## 2021-12-07 DIAGNOSIS — Z79891 Long term (current) use of opiate analgesic: Secondary | ICD-10-CM | POA: Diagnosis not present

## 2021-12-07 DIAGNOSIS — G894 Chronic pain syndrome: Secondary | ICD-10-CM | POA: Diagnosis not present

## 2021-12-07 DIAGNOSIS — M47816 Spondylosis without myelopathy or radiculopathy, lumbar region: Secondary | ICD-10-CM | POA: Diagnosis not present

## 2021-12-08 DIAGNOSIS — R531 Weakness: Secondary | ICD-10-CM | POA: Diagnosis not present

## 2021-12-08 DIAGNOSIS — M25675 Stiffness of left foot, not elsewhere classified: Secondary | ICD-10-CM | POA: Diagnosis not present

## 2021-12-08 DIAGNOSIS — M25572 Pain in left ankle and joints of left foot: Secondary | ICD-10-CM | POA: Diagnosis not present

## 2021-12-08 DIAGNOSIS — M25372 Other instability, left ankle: Secondary | ICD-10-CM | POA: Diagnosis not present

## 2021-12-08 DIAGNOSIS — R262 Difficulty in walking, not elsewhere classified: Secondary | ICD-10-CM | POA: Diagnosis not present

## 2021-12-21 DIAGNOSIS — I10 Essential (primary) hypertension: Secondary | ICD-10-CM | POA: Diagnosis not present

## 2021-12-21 DIAGNOSIS — J4530 Mild persistent asthma, uncomplicated: Secondary | ICD-10-CM | POA: Diagnosis not present

## 2021-12-21 DIAGNOSIS — Z794 Long term (current) use of insulin: Secondary | ICD-10-CM | POA: Diagnosis not present

## 2021-12-21 DIAGNOSIS — E1165 Type 2 diabetes mellitus with hyperglycemia: Secondary | ICD-10-CM | POA: Diagnosis not present

## 2021-12-21 DIAGNOSIS — E782 Mixed hyperlipidemia: Secondary | ICD-10-CM | POA: Diagnosis not present

## 2021-12-22 DIAGNOSIS — R531 Weakness: Secondary | ICD-10-CM | POA: Diagnosis not present

## 2021-12-22 DIAGNOSIS — M25675 Stiffness of left foot, not elsewhere classified: Secondary | ICD-10-CM | POA: Diagnosis not present

## 2021-12-22 DIAGNOSIS — R262 Difficulty in walking, not elsewhere classified: Secondary | ICD-10-CM | POA: Diagnosis not present

## 2021-12-22 DIAGNOSIS — M25372 Other instability, left ankle: Secondary | ICD-10-CM | POA: Diagnosis not present

## 2021-12-22 DIAGNOSIS — M25572 Pain in left ankle and joints of left foot: Secondary | ICD-10-CM | POA: Diagnosis not present

## 2021-12-29 DIAGNOSIS — R262 Difficulty in walking, not elsewhere classified: Secondary | ICD-10-CM | POA: Diagnosis not present

## 2021-12-29 DIAGNOSIS — M25572 Pain in left ankle and joints of left foot: Secondary | ICD-10-CM | POA: Diagnosis not present

## 2021-12-29 DIAGNOSIS — M25372 Other instability, left ankle: Secondary | ICD-10-CM | POA: Diagnosis not present

## 2021-12-29 DIAGNOSIS — R531 Weakness: Secondary | ICD-10-CM | POA: Diagnosis not present

## 2021-12-29 DIAGNOSIS — M25675 Stiffness of left foot, not elsewhere classified: Secondary | ICD-10-CM | POA: Diagnosis not present

## 2022-01-04 ENCOUNTER — Ambulatory Visit: Payer: Medicare Other | Admitting: Podiatry

## 2022-01-12 DIAGNOSIS — M25372 Other instability, left ankle: Secondary | ICD-10-CM | POA: Diagnosis not present

## 2022-01-12 DIAGNOSIS — M25572 Pain in left ankle and joints of left foot: Secondary | ICD-10-CM | POA: Diagnosis not present

## 2022-01-12 DIAGNOSIS — M25675 Stiffness of left foot, not elsewhere classified: Secondary | ICD-10-CM | POA: Diagnosis not present

## 2022-01-12 DIAGNOSIS — R531 Weakness: Secondary | ICD-10-CM | POA: Diagnosis not present

## 2022-01-12 DIAGNOSIS — R262 Difficulty in walking, not elsewhere classified: Secondary | ICD-10-CM | POA: Diagnosis not present

## 2022-01-15 DIAGNOSIS — G894 Chronic pain syndrome: Secondary | ICD-10-CM | POA: Diagnosis not present

## 2022-01-15 DIAGNOSIS — M47816 Spondylosis without myelopathy or radiculopathy, lumbar region: Secondary | ICD-10-CM | POA: Diagnosis not present

## 2022-01-15 DIAGNOSIS — Z79891 Long term (current) use of opiate analgesic: Secondary | ICD-10-CM | POA: Diagnosis not present

## 2022-01-18 ENCOUNTER — Ambulatory Visit: Payer: Medicare Other | Admitting: Podiatry

## 2022-01-18 DIAGNOSIS — M79675 Pain in left toe(s): Secondary | ICD-10-CM | POA: Diagnosis not present

## 2022-01-18 DIAGNOSIS — B351 Tinea unguium: Secondary | ICD-10-CM

## 2022-01-18 DIAGNOSIS — M79674 Pain in right toe(s): Secondary | ICD-10-CM

## 2022-01-18 DIAGNOSIS — W172XXS Fall into hole, sequela: Secondary | ICD-10-CM | POA: Diagnosis not present

## 2022-01-18 DIAGNOSIS — M7752 Other enthesopathy of left foot: Secondary | ICD-10-CM

## 2022-01-18 NOTE — Progress Notes (Signed)
Subjective: 61 year old female presents the office today for follow-up evaluation of left foot pain which started after she fell into the hole.  She is still doing physical therapy which is helping some.  She has intermittent discomfort.  She has pain to the top as well as side of her foot.  No new injuries or concerns otherwise.   Also asking for the nails be trimmed today as are thickened elongated she has difficulty trimming them herself.  No swelling or redness or any drainage to the tendon sites.  Objective: AAO x3, NAD DP/PT pulses palpable bilaterally, CRT less than 3 seconds On today's exam the majority of tenderness is still localized on the midfoot area.  The pain is mostly along the interspaces of the metatarsals.  Mild discomfort noted in the area but clinically the tendon appears to be intact.  Flexor, extensor tendons intact.  MMT 5/5. Nails are hypertrophic, dystrophic, brittle, discolored, elongated 10. No surrounding redness or drainage. Tenderness nails 1-5 bilaterally. No open lesions or pre-ulcerative lesions are identified today. No pain with calf compression, swelling, warmth, erythema  Assessment: Bursitis; symptomatic onychomycosis  Plan: -All treatment options discussed with the patient including all alternatives, risks, complications.  -Continue physical therapy.  Discussed continue supportive shoe gear.  Discussed repeat MRI if needed but she will follow up on this. -Sharply debrided the nails x10 without any complications or bleeding. -Patient encouraged to call the office with any questions, concerns, change in symptoms.   Trula Slade DPM

## 2022-01-23 DIAGNOSIS — E782 Mixed hyperlipidemia: Secondary | ICD-10-CM | POA: Diagnosis not present

## 2022-01-23 DIAGNOSIS — Z794 Long term (current) use of insulin: Secondary | ICD-10-CM | POA: Diagnosis not present

## 2022-01-23 DIAGNOSIS — J4530 Mild persistent asthma, uncomplicated: Secondary | ICD-10-CM | POA: Diagnosis not present

## 2022-01-23 DIAGNOSIS — I10 Essential (primary) hypertension: Secondary | ICD-10-CM | POA: Diagnosis not present

## 2022-01-23 DIAGNOSIS — E1165 Type 2 diabetes mellitus with hyperglycemia: Secondary | ICD-10-CM | POA: Diagnosis not present

## 2022-01-26 DIAGNOSIS — M25675 Stiffness of left foot, not elsewhere classified: Secondary | ICD-10-CM | POA: Diagnosis not present

## 2022-01-26 DIAGNOSIS — M25572 Pain in left ankle and joints of left foot: Secondary | ICD-10-CM | POA: Diagnosis not present

## 2022-01-26 DIAGNOSIS — R262 Difficulty in walking, not elsewhere classified: Secondary | ICD-10-CM | POA: Diagnosis not present

## 2022-01-26 DIAGNOSIS — M25372 Other instability, left ankle: Secondary | ICD-10-CM | POA: Diagnosis not present

## 2022-01-26 DIAGNOSIS — R531 Weakness: Secondary | ICD-10-CM | POA: Diagnosis not present

## 2022-02-09 DIAGNOSIS — M25372 Other instability, left ankle: Secondary | ICD-10-CM | POA: Diagnosis not present

## 2022-02-09 DIAGNOSIS — M25572 Pain in left ankle and joints of left foot: Secondary | ICD-10-CM | POA: Diagnosis not present

## 2022-02-09 DIAGNOSIS — R262 Difficulty in walking, not elsewhere classified: Secondary | ICD-10-CM | POA: Diagnosis not present

## 2022-02-09 DIAGNOSIS — M25675 Stiffness of left foot, not elsewhere classified: Secondary | ICD-10-CM | POA: Diagnosis not present

## 2022-02-09 DIAGNOSIS — R531 Weakness: Secondary | ICD-10-CM | POA: Diagnosis not present

## 2022-02-15 DIAGNOSIS — Z79891 Long term (current) use of opiate analgesic: Secondary | ICD-10-CM | POA: Diagnosis not present

## 2022-02-15 DIAGNOSIS — M47816 Spondylosis without myelopathy or radiculopathy, lumbar region: Secondary | ICD-10-CM | POA: Diagnosis not present

## 2022-03-15 DIAGNOSIS — Z79891 Long term (current) use of opiate analgesic: Secondary | ICD-10-CM | POA: Diagnosis not present

## 2022-03-15 DIAGNOSIS — M47816 Spondylosis without myelopathy or radiculopathy, lumbar region: Secondary | ICD-10-CM | POA: Diagnosis not present

## 2022-03-22 ENCOUNTER — Ambulatory Visit: Payer: Medicare Other | Admitting: Podiatry

## 2022-03-23 DIAGNOSIS — I10 Essential (primary) hypertension: Secondary | ICD-10-CM | POA: Diagnosis not present

## 2022-03-23 DIAGNOSIS — E1165 Type 2 diabetes mellitus with hyperglycemia: Secondary | ICD-10-CM | POA: Diagnosis not present

## 2022-03-23 DIAGNOSIS — E782 Mixed hyperlipidemia: Secondary | ICD-10-CM | POA: Diagnosis not present

## 2022-03-23 DIAGNOSIS — Z794 Long term (current) use of insulin: Secondary | ICD-10-CM | POA: Diagnosis not present

## 2022-03-23 DIAGNOSIS — J4530 Mild persistent asthma, uncomplicated: Secondary | ICD-10-CM | POA: Diagnosis not present

## 2022-03-27 DIAGNOSIS — I10 Essential (primary) hypertension: Secondary | ICD-10-CM | POA: Diagnosis not present

## 2022-03-27 DIAGNOSIS — Z9189 Other specified personal risk factors, not elsewhere classified: Secondary | ICD-10-CM | POA: Diagnosis not present

## 2022-03-27 DIAGNOSIS — J4530 Mild persistent asthma, uncomplicated: Secondary | ICD-10-CM | POA: Diagnosis not present

## 2022-03-27 DIAGNOSIS — Z794 Long term (current) use of insulin: Secondary | ICD-10-CM | POA: Diagnosis not present

## 2022-03-27 DIAGNOSIS — E1165 Type 2 diabetes mellitus with hyperglycemia: Secondary | ICD-10-CM | POA: Diagnosis not present

## 2022-03-27 DIAGNOSIS — E782 Mixed hyperlipidemia: Secondary | ICD-10-CM | POA: Diagnosis not present

## 2022-04-11 DIAGNOSIS — J40 Bronchitis, not specified as acute or chronic: Secondary | ICD-10-CM | POA: Diagnosis not present

## 2022-04-17 DIAGNOSIS — M47816 Spondylosis without myelopathy or radiculopathy, lumbar region: Secondary | ICD-10-CM | POA: Diagnosis not present

## 2022-04-17 DIAGNOSIS — Z79891 Long term (current) use of opiate analgesic: Secondary | ICD-10-CM | POA: Diagnosis not present

## 2022-04-27 DIAGNOSIS — J4530 Mild persistent asthma, uncomplicated: Secondary | ICD-10-CM | POA: Diagnosis not present

## 2022-05-01 ENCOUNTER — Ambulatory Visit: Payer: Medicare Other | Admitting: Internal Medicine

## 2022-05-17 DIAGNOSIS — Z79891 Long term (current) use of opiate analgesic: Secondary | ICD-10-CM | POA: Diagnosis not present

## 2022-05-17 DIAGNOSIS — M47816 Spondylosis without myelopathy or radiculopathy, lumbar region: Secondary | ICD-10-CM | POA: Diagnosis not present

## 2022-05-28 ENCOUNTER — Ambulatory Visit: Payer: Medicare Other | Admitting: Podiatry

## 2022-06-18 ENCOUNTER — Encounter: Payer: Medicare Other | Admitting: Obstetrics and Gynecology

## 2022-06-18 ENCOUNTER — Telehealth: Payer: Self-pay

## 2022-06-18 DIAGNOSIS — Z79891 Long term (current) use of opiate analgesic: Secondary | ICD-10-CM | POA: Diagnosis not present

## 2022-06-18 DIAGNOSIS — M47816 Spondylosis without myelopathy or radiculopathy, lumbar region: Secondary | ICD-10-CM | POA: Diagnosis not present

## 2022-06-18 NOTE — Telephone Encounter (Signed)
Called pt to confirm appointment for tomorrow.  Left message for pt to call and confirm appointment by 1500 06/19/22

## 2022-06-20 ENCOUNTER — Encounter: Payer: Medicare Other | Admitting: Family Medicine

## 2022-06-20 ENCOUNTER — Ambulatory Visit: Payer: Medicare Other | Admitting: Internal Medicine

## 2022-06-20 ENCOUNTER — Encounter: Payer: Self-pay | Admitting: Family Medicine

## 2022-06-20 NOTE — Progress Notes (Signed)
Patient did not keep appointment today. She may call to reschedule.  

## 2022-06-20 NOTE — Progress Notes (Deleted)
Name: Breanna Berger  MRN/ DOB: [id number], [birth year]   Age/ Sex: 62 y.o., female    PCP: Trey Sailors, PA   Reason for Endocrinology Evaluation: Type 2 Diabetes Mellitus     Date of Initial Endocrinology Visit: 07/27/2021    PATIENT IDENTIFIER: Breanna Berger is a 62 y.o. female with a past medical history of T2DM, dyslipidemia, CHF. The patient presented for initial endocrinology clinic visit on 2/16/2023for consultative assistance with her diabetes management.    HPI: Breanna Berger was    Diagnosed with DM 2008 Prior Medications tried/Intolerance: As listed Currently checking blood sugars multiple  x / day,  through CGM until recently when she got frustrated with it        Hemoglobin A1c has ranged from 6.8% in 2010, peaking at 9.8% in 2013.    On her initial visit to our clinic she had an A1c of 9.2%, she was already on metformin and Lantus, we increased Lantus and started Mounjaro  SUBJECTIVE:   During the last visit (10/25/2021): A1c 7.8%    Today (06/20/22): Breanna Berger is here for follow-up on diabetes management.  She is  accompanied by her daughter today. She checks her blood sugars 1 times daily. The patient has not  had hypoglycemic episodes since the last clinic visit.    She follows with [facility] for chronic pain syndrome 06/18/2022 She follows with podiatry last visit 07/2021  HOME DIABETES REGIMEN: Metformin 1000 mg BID  Lantus 36 units daily Mounjaro 5 mg weekly   Statin: yes ACE-I/ARB: yes Prior Diabetic Education: no    METER DOWNLOAD SUMMARY:  DIABETIC COMPLICATIONS: Microvascular complications:  Neuropathy Denies: CKD, retinopathy  Last eye exam: Completed 10/2020  Macrovascular complications:  CHF Denies: CAD, PVD, CVA   PAST HISTORY: Past Medical History:  Past Medical History:  Diagnosis Date   Abdominal pain, unspecified site 08/24/2013   Diabetes mellitus    Dyspnea 08/24/2013   HLD (hyperlipidemia)    MVP  (mitral valve prolapse)    Sinusitis    Suppurative hidradenitis 07/27/2013   SVT (supraventricular tachycardia) ([place]) 09/09/2014   converted with vagal    UTI (lower urinary tract infection) 07/27/2013   Past Surgical History:  Past Surgical History:  Procedure Laterality Date   CESAREAN SECTION     CESAREAN SECTION WITH BILATERAL TUBAL LIGATION     CHOLECYSTECTOMY     HERNIA REPAIR     UMBILICAL HERNIA REPAIR      Social History:  reports that she quit smoking about 7 years ago. Her smoking use included cigarettes. She has never used smokeless tobacco. She reports that she does not drink alcohol and does not use drugs. Family History:  Family History  Problem Relation Age of Onset   Diabetes Paternal Grandmother    Heart disease Paternal Aunt    Clotting disorder Mother    Lymphoma Mother    Stomach cancer Maternal Aunt    Colon cancer Maternal Aunt    Prostate cancer Maternal Uncle    Heart disease Paternal Grandfather    Coronary artery disease Paternal Grandfather    Lung cancer Maternal Uncle      HOME MEDICATIONS: Allergies as of 06/20/2022       Reactions   Erythromycin Hives, Itching, Swelling   Keflex [cephalexin] Hives, Itching, Swelling   Doxycycline Hyclate Rash        Medication List        Accurate as of June 20, 2022  7:24  AM. If you have any questions, ask your nurse or doctor.          Accu-Chek Guide Me w/Device Kit Use to check blood sugar 3 times a day   Accu-Chek Guide test strip Generic drug: glucose blood Use to check blood sugar 3 times a day.   albuterol 108 (90 Base) MCG/ACT inhaler Commonly known as: VENTOLIN HFA Inhale 2 puffs into the lungs every 6 (six) hours as needed for wheezing or shortness of breath.   aspirin 81 MG chewable tablet   atorvastatin 20 MG tablet Commonly known as: LIPITOR Take 1 tablet by mouth at bedtime.   BD Pen Needle Micro U/F 32G X 6 MM Misc Generic drug: Insulin Pen Needle 1 Device by  Does not apply route daily in the afternoon.   Cholecalciferol 25 MCG (1000 UT) capsule Take by mouth.   FreeStyle Libre 2 Sensor Misc 1 Device by Does not apply route every 14 (fourteen) days.   icosapent Ethyl 1 g capsule Commonly known as: VASCEPA   Lantus SoloStar 100 UNIT/ML Solostar Pen Generic drug: insulin glargine Inject 36 Units into the skin at bedtime.   Linzess 290 MCG Caps capsule Generic drug: linaclotide Take 290 mcg by mouth daily.   metFORMIN 1000 MG tablet Commonly known as: Glucophage Take 1 tablet (1,000 mg total) by mouth 2 (two) times daily with a meal.   metoprolol succinate 50 MG 24 hr tablet Commonly known as: TOPROL-XL Take 50 mg by mouth daily. Take with or immediately following a meal.   nitroGLYCERIN 0.4 MG SL tablet Commonly known as: NITROSTAT   NONFORMULARY OR COMPOUNDED ITEM Antifungal solution: Terbinafine 3%, Fluconazole 2%, Tea Tree Oil 5%, Urea 10%, Ibuprofen 2% in DMSO suspension #59m   Olopatadine HCl 0.2 % Soln   oxyCODONE-acetaminophen 7.5-325 MG tablet Commonly known as: PERCOCET Take 1 tablet by mouth 4 (four) times daily.   tirzepatide 5 MG/0.5ML Pen Commonly known as: MOUNJARO Inject 5 mg into the skin once a week.         ALLERGIES: Allergies  Allergen Reactions   Erythromycin Hives, Itching and Swelling   Keflex [Cephalexin] Hives, Itching and Swelling   Doxycycline Hyclate Rash     REVIEW OF SYSTEMS: A comprehensive ROS was conducted with the patient and is negative except as per HPI     OBJECTIVE:   VITAL SIGNS: There were no vitals taken for this visit.   PHYSICAL EXAM:  General: Pt appears well and is in NAD  Neck: General: Supple without adenopathy or carotid bruits. Thyroid: Thyroid size normal.  No goiter or nodules appreciated.   Lungs: Clear with good BS bilat with no rales, rhonchi, or wheezes  Heart: RRR   Abdomen: Normoactive bowel sounds, soft, nontender, without masses or organomegaly  palpable  Extremities:  Lower extremities - Trace  pretibial edema.   Neuro: MS is good with appropriate affect, pt is alert and Ox3    DATA REVIEWED:  Lab Results  Component Value Date   HGBA1C 7.8 (A) 10/25/2021   HGBA1C 9.2 (A) 07/27/2021   HGBA1C 7.2 (A) 12/25/2019     Latest Reference Range & Units 07/27/21 09:51  Sodium 135 - 145 mEq/L 138  Potassium 3.5 - 5.1 mEq/L 4.4  Chloride 96 - 112 mEq/L 102  CO2 19 - 32 mEq/L 32  Glucose 70 - 99 mg/dL 282 (H)  BUN 6 - 23 mg/dL 9  Creatinine 0.40 - 1.20 mg/dL 0.65  Calcium 8.4 - 10.5 mg/dL  9.7  Alkaline Phosphatase 39 - 117 U/L 86  Albumin 3.5 - 5.2 g/dL 4.0  AST 0 - 37 U/L 18  ALT 0 - 35 U/L 21  Total Protein 6.0 - 8.3 g/dL 6.7  Total Bilirubin 0.2 - 1.2 mg/dL 0.5  GFR >60.00 mL/min 97.03    Latest Reference Range & Units 07/27/21 09:51  Total CHOL/HDL Ratio  3  Cholesterol 0 - 200 mg/dL 125  HDL Cholesterol >39.00 mg/dL 43.30  LDL (calc) 0 - 99 mg/dL 44  MICROALB/CREAT RATIO 0.0 - 30.0 mg/g 8.9  NonHDL  81.83  Triglycerides 0.0 - 149.0 mg/dL 191.0 (H)  VLDL 0.0 - 40.0 mg/dL 38.2    Latest Reference Range & Units 07/27/21 09:51  Creatinine,U mg/dL 146.0  Microalb, Ur 0.0 - 1.9 mg/dL 13.0 (H)  MICROALB/CREAT RATIO 0.0 - 30.0 mg/g 8.9            Latest Reference Range & Units 12/24/20 22:13  Sodium 135 - 145 mmol/L 135  Potassium 3.5 - 5.1 mmol/L 4.0  Chloride 98 - 111 mmol/L 101  CO2 22 - 32 mmol/L 25  Glucose 70 - 99 mg/dL 410 (H)  BUN 6 - 20 mg/dL 10  Creatinine 0.44 - 1.00 mg/dL 0.69  Calcium 8.9 - 10.3 mg/dL 9.6  Anion gap 5 - 15  9  GFR, Estimated >60 mL/min >60    In-office Bg 150 mg/dL    ASSESSMENT / PLAN / RECOMMENDATIONS:   1) Type 2 Diabetes Mellitus, with improving glycemic control, With neuropathic complications - Most recent A1c of 7.8 %. Goal A1c < 7.0 %.    -I have praised the patient improved glycemic control A1c down from 9.2% to 7.8% -I am going to increase Mounjaro and reduce  Lantus to prevent hypoglycemia  MEDICATIONS: Increase Mounjaro 5 mg weekly Continue metformin 1000 mg twice daily Decrease Lantus to 36 units daily  EDUCATION / INSTRUCTIONS: BG monitoring instructions: Patient is instructed to check her blood sugars 3 times a day, before meals. Call [facility] if: BG persistently < 70  I reviewed the Rule of 15 for the treatment of hypoglycemia in detail with the patient. Literature supplied.   2) Diabetic complications:  Eye: Does not have known diabetic retinopathy.  Neuro/ Feet: Does  have known diabetic peripheral neuropathy. Renal: Patient does not have known baseline CKD. She is on an ACEI/ARB at present.   3) Dyslipidemia:   -LDL at goal, TG have been elevated we will continue to monitor  Medication  Continue atorvastatin 20 mg daily  Follow-up in 6 months      Signed electronically by: Mack Guise, MD  [facility]  [facility] [address] [place], [place] [address] Phone: [phone] FAX: [phone]   CC: Trey Sailors, [place] [address] [place] [address] Phone: [phone]  Fax: [phone]    Return to Endocrinology clinic as below: Future Appointments  Date Time Provider [place]  06/20/2022 10:30 AM Gaje Tennyson, Melanie Crazier, MD [facility] None  06/20/2022  2:10 PM Donnamae Jude, MD [facility]

## 2022-07-16 DIAGNOSIS — Z794 Long term (current) use of insulin: Secondary | ICD-10-CM | POA: Diagnosis not present

## 2022-07-16 DIAGNOSIS — Z0000 Encounter for general adult medical examination without abnormal findings: Secondary | ICD-10-CM | POA: Diagnosis not present

## 2022-07-16 DIAGNOSIS — J4530 Mild persistent asthma, uncomplicated: Secondary | ICD-10-CM | POA: Diagnosis not present

## 2022-07-16 DIAGNOSIS — E1165 Type 2 diabetes mellitus with hyperglycemia: Secondary | ICD-10-CM | POA: Diagnosis not present

## 2022-07-16 DIAGNOSIS — E559 Vitamin D deficiency, unspecified: Secondary | ICD-10-CM | POA: Diagnosis not present

## 2022-07-16 DIAGNOSIS — E782 Mixed hyperlipidemia: Secondary | ICD-10-CM | POA: Diagnosis not present

## 2022-07-16 DIAGNOSIS — I10 Essential (primary) hypertension: Secondary | ICD-10-CM | POA: Diagnosis not present

## 2022-07-17 DIAGNOSIS — M47816 Spondylosis without myelopathy or radiculopathy, lumbar region: Secondary | ICD-10-CM | POA: Diagnosis not present

## 2022-07-17 DIAGNOSIS — G894 Chronic pain syndrome: Secondary | ICD-10-CM | POA: Diagnosis not present

## 2022-07-17 DIAGNOSIS — Z79891 Long term (current) use of opiate analgesic: Secondary | ICD-10-CM | POA: Diagnosis not present

## 2022-07-31 DIAGNOSIS — Z0000 Encounter for general adult medical examination without abnormal findings: Secondary | ICD-10-CM | POA: Diagnosis not present

## 2022-07-31 DIAGNOSIS — E559 Vitamin D deficiency, unspecified: Secondary | ICD-10-CM | POA: Diagnosis not present

## 2022-07-31 DIAGNOSIS — Z1389 Encounter for screening for other disorder: Secondary | ICD-10-CM | POA: Diagnosis not present

## 2022-07-31 DIAGNOSIS — E782 Mixed hyperlipidemia: Secondary | ICD-10-CM | POA: Diagnosis not present

## 2022-07-31 DIAGNOSIS — Z794 Long term (current) use of insulin: Secondary | ICD-10-CM | POA: Diagnosis not present

## 2022-07-31 DIAGNOSIS — J4530 Mild persistent asthma, uncomplicated: Secondary | ICD-10-CM | POA: Diagnosis not present

## 2022-07-31 DIAGNOSIS — E1165 Type 2 diabetes mellitus with hyperglycemia: Secondary | ICD-10-CM | POA: Diagnosis not present

## 2022-07-31 DIAGNOSIS — I10 Essential (primary) hypertension: Secondary | ICD-10-CM | POA: Diagnosis not present

## 2022-08-02 DIAGNOSIS — Z1231 Encounter for screening mammogram for malignant neoplasm of breast: Secondary | ICD-10-CM | POA: Diagnosis not present

## 2022-08-16 DIAGNOSIS — Z79891 Long term (current) use of opiate analgesic: Secondary | ICD-10-CM | POA: Diagnosis not present

## 2022-08-16 DIAGNOSIS — G894 Chronic pain syndrome: Secondary | ICD-10-CM | POA: Diagnosis not present

## 2022-08-16 DIAGNOSIS — M47816 Spondylosis without myelopathy or radiculopathy, lumbar region: Secondary | ICD-10-CM | POA: Diagnosis not present

## 2022-09-13 DIAGNOSIS — Z79891 Long term (current) use of opiate analgesic: Secondary | ICD-10-CM | POA: Diagnosis not present

## 2022-09-13 DIAGNOSIS — G894 Chronic pain syndrome: Secondary | ICD-10-CM | POA: Diagnosis not present

## 2022-09-13 DIAGNOSIS — M47816 Spondylosis without myelopathy or radiculopathy, lumbar region: Secondary | ICD-10-CM | POA: Diagnosis not present

## 2022-09-14 DIAGNOSIS — R051 Acute cough: Secondary | ICD-10-CM | POA: Diagnosis not present

## 2022-09-14 DIAGNOSIS — R0602 Shortness of breath: Secondary | ICD-10-CM | POA: Diagnosis not present

## 2022-09-24 ENCOUNTER — Encounter: Payer: Self-pay | Admitting: Podiatry

## 2022-09-24 ENCOUNTER — Ambulatory Visit: Payer: Medicare Other | Admitting: Podiatry

## 2022-09-24 DIAGNOSIS — M79675 Pain in left toe(s): Secondary | ICD-10-CM

## 2022-09-24 DIAGNOSIS — M79674 Pain in right toe(s): Secondary | ICD-10-CM

## 2022-09-24 DIAGNOSIS — E1165 Type 2 diabetes mellitus with hyperglycemia: Secondary | ICD-10-CM | POA: Diagnosis not present

## 2022-09-24 DIAGNOSIS — B351 Tinea unguium: Secondary | ICD-10-CM

## 2022-09-24 NOTE — Progress Notes (Signed)
  Subjective:  Patient ID: Breanna Berger, female    DOB: 06/16/1960,   MRN: [id number]  Chief Complaint  Patient presents with   Diabetes    Nail trim     62 y.o. female presents for concern of thickened elongated and painful nails that are difficult to trim. Requesting to have them trimmed today. Relates burning and tingling in their feet. Patient is diabetic and last A1c was  Lab Results  Component Value Date   HGBA1C 7.8 (A) 10/25/2021   .   PCP:  Norm Salt, PA    . Denies any other pedal complaints. Denies n/v/f/c.   Past Medical History:  Diagnosis Date   Abdominal pain, unspecified site 08/24/2013   Diabetes mellitus    Dyspnea 08/24/2013   HLD (hyperlipidemia)    MVP (mitral valve prolapse)    Sinusitis    Suppurative hidradenitis 07/27/2013   SVT (supraventricular tachycardia) 09/09/2014   converted with vagal    UTI (lower urinary tract infection) 07/27/2013    Objective:  Physical Exam: Vascular: DP/PT pulses 2/4 bilateral. CFT <3 seconds. Absent hair growth on digits. Edema noted to bilateral lower extremities. Xerosis noted bilaterally.  Skin. No lacerations or abrasions bilateral feet. Nails 1-5 bilateral  are thickened discolored and elongated with subungual debris.  Musculoskeletal: MMT 5/5 bilateral lower extremities in DF, PF, Inversion and Eversion. Deceased ROM in DF of ankle joint. Tendon to the dorsum of the left foot primarily over the metatarsals.  Neurological: Sensation intact to light touch. Protective sensation intact bilateral.    Assessment:   1. Dermatophytosis of nail   2. Pain in toes of both feet   3. Uncontrolled type 2 diabetes mellitus with hyperglycemia      Plan:  Patient was evaluated and treated and all questions answered. -Discussed and educated patient on diabetic foot care, especially with  regards to the vascular, neurological and musculoskeletal systems.  -Stressed the importance of good glycemic control and the  detriment of not  controlling glucose levels in relation to the foot. -Discussed supportive shoes at all times and checking feet regularly.  -Mechanically debrided all nails 1-5 bilateral using sterile nail nipper and filed with dremel without incident  -Answered all patient questions -Patient to return  in 3 months for at risk foot care -Patient advised to call the office if any problems or questions arise in the meantime.   Louann Sjogren, DPM

## 2022-09-25 ENCOUNTER — Ambulatory Visit: Payer: Medicare Other

## 2022-09-25 DIAGNOSIS — E782 Mixed hyperlipidemia: Secondary | ICD-10-CM | POA: Diagnosis not present

## 2022-09-25 DIAGNOSIS — Z794 Long term (current) use of insulin: Secondary | ICD-10-CM | POA: Diagnosis not present

## 2022-09-25 DIAGNOSIS — I10 Essential (primary) hypertension: Secondary | ICD-10-CM | POA: Diagnosis not present

## 2022-09-25 DIAGNOSIS — E559 Vitamin D deficiency, unspecified: Secondary | ICD-10-CM | POA: Diagnosis not present

## 2022-09-25 DIAGNOSIS — E1165 Type 2 diabetes mellitus with hyperglycemia: Secondary | ICD-10-CM | POA: Diagnosis not present

## 2022-09-25 DIAGNOSIS — H1013 Acute atopic conjunctivitis, bilateral: Secondary | ICD-10-CM | POA: Diagnosis not present

## 2022-09-25 DIAGNOSIS — J4530 Mild persistent asthma, uncomplicated: Secondary | ICD-10-CM | POA: Diagnosis not present

## 2022-10-11 ENCOUNTER — Ambulatory Visit: Payer: Medicare Other | Admitting: Podiatry

## 2022-10-13 ENCOUNTER — Other Ambulatory Visit: Payer: Self-pay | Admitting: Internal Medicine

## 2022-10-17 DIAGNOSIS — G894 Chronic pain syndrome: Secondary | ICD-10-CM | POA: Diagnosis not present

## 2022-10-17 DIAGNOSIS — M47816 Spondylosis without myelopathy or radiculopathy, lumbar region: Secondary | ICD-10-CM | POA: Diagnosis not present

## 2022-10-17 DIAGNOSIS — Z79891 Long term (current) use of opiate analgesic: Secondary | ICD-10-CM | POA: Diagnosis not present

## 2022-10-29 DIAGNOSIS — I10 Essential (primary) hypertension: Secondary | ICD-10-CM | POA: Diagnosis not present

## 2022-10-29 DIAGNOSIS — E1165 Type 2 diabetes mellitus with hyperglycemia: Secondary | ICD-10-CM | POA: Diagnosis not present

## 2022-10-29 DIAGNOSIS — J4530 Mild persistent asthma, uncomplicated: Secondary | ICD-10-CM | POA: Diagnosis not present

## 2022-10-29 DIAGNOSIS — E559 Vitamin D deficiency, unspecified: Secondary | ICD-10-CM | POA: Diagnosis not present

## 2022-10-29 DIAGNOSIS — Z794 Long term (current) use of insulin: Secondary | ICD-10-CM | POA: Diagnosis not present

## 2022-10-29 DIAGNOSIS — E782 Mixed hyperlipidemia: Secondary | ICD-10-CM | POA: Diagnosis not present

## 2022-11-14 DIAGNOSIS — M47816 Spondylosis without myelopathy or radiculopathy, lumbar region: Secondary | ICD-10-CM | POA: Diagnosis not present

## 2022-11-14 DIAGNOSIS — G894 Chronic pain syndrome: Secondary | ICD-10-CM | POA: Diagnosis not present

## 2022-12-18 DIAGNOSIS — M47816 Spondylosis without myelopathy or radiculopathy, lumbar region: Secondary | ICD-10-CM | POA: Diagnosis not present

## 2022-12-18 DIAGNOSIS — G894 Chronic pain syndrome: Secondary | ICD-10-CM | POA: Diagnosis not present

## 2022-12-26 DIAGNOSIS — E1165 Type 2 diabetes mellitus with hyperglycemia: Secondary | ICD-10-CM | POA: Diagnosis not present

## 2022-12-26 DIAGNOSIS — E559 Vitamin D deficiency, unspecified: Secondary | ICD-10-CM | POA: Diagnosis not present

## 2022-12-26 DIAGNOSIS — E782 Mixed hyperlipidemia: Secondary | ICD-10-CM | POA: Diagnosis not present

## 2022-12-26 DIAGNOSIS — R051 Acute cough: Secondary | ICD-10-CM | POA: Diagnosis not present

## 2022-12-26 DIAGNOSIS — L03116 Cellulitis of left lower limb: Secondary | ICD-10-CM | POA: Diagnosis not present

## 2022-12-26 DIAGNOSIS — Z794 Long term (current) use of insulin: Secondary | ICD-10-CM | POA: Diagnosis not present

## 2022-12-26 DIAGNOSIS — I10 Essential (primary) hypertension: Secondary | ICD-10-CM | POA: Diagnosis not present

## 2022-12-26 DIAGNOSIS — J4530 Mild persistent asthma, uncomplicated: Secondary | ICD-10-CM | POA: Diagnosis not present

## 2023-01-06 ENCOUNTER — Other Ambulatory Visit: Payer: Self-pay | Admitting: Internal Medicine

## 2023-01-06 DIAGNOSIS — E1165 Type 2 diabetes mellitus with hyperglycemia: Secondary | ICD-10-CM

## 2023-01-08 DIAGNOSIS — M47816 Spondylosis without myelopathy or radiculopathy, lumbar region: Secondary | ICD-10-CM | POA: Diagnosis not present

## 2023-01-08 DIAGNOSIS — G894 Chronic pain syndrome: Secondary | ICD-10-CM | POA: Diagnosis not present

## 2023-01-29 DIAGNOSIS — Z794 Long term (current) use of insulin: Secondary | ICD-10-CM | POA: Diagnosis not present

## 2023-01-29 DIAGNOSIS — E1165 Type 2 diabetes mellitus with hyperglycemia: Secondary | ICD-10-CM | POA: Diagnosis not present

## 2023-01-29 DIAGNOSIS — I10 Essential (primary) hypertension: Secondary | ICD-10-CM | POA: Diagnosis not present

## 2023-01-29 DIAGNOSIS — J4530 Mild persistent asthma, uncomplicated: Secondary | ICD-10-CM | POA: Diagnosis not present

## 2023-01-29 DIAGNOSIS — E782 Mixed hyperlipidemia: Secondary | ICD-10-CM | POA: Diagnosis not present

## 2023-01-29 DIAGNOSIS — E559 Vitamin D deficiency, unspecified: Secondary | ICD-10-CM | POA: Diagnosis not present

## 2023-02-14 DIAGNOSIS — M25562 Pain in left knee: Secondary | ICD-10-CM | POA: Diagnosis not present

## 2023-02-14 DIAGNOSIS — G894 Chronic pain syndrome: Secondary | ICD-10-CM | POA: Diagnosis not present

## 2023-02-19 DIAGNOSIS — I10 Essential (primary) hypertension: Secondary | ICD-10-CM | POA: Diagnosis not present

## 2023-02-19 DIAGNOSIS — E559 Vitamin D deficiency, unspecified: Secondary | ICD-10-CM | POA: Diagnosis not present

## 2023-02-19 DIAGNOSIS — H00014 Hordeolum externum left upper eyelid: Secondary | ICD-10-CM | POA: Diagnosis not present

## 2023-02-19 DIAGNOSIS — E782 Mixed hyperlipidemia: Secondary | ICD-10-CM | POA: Diagnosis not present

## 2023-02-19 DIAGNOSIS — L089 Local infection of the skin and subcutaneous tissue, unspecified: Secondary | ICD-10-CM | POA: Diagnosis not present

## 2023-02-19 DIAGNOSIS — J4530 Mild persistent asthma, uncomplicated: Secondary | ICD-10-CM | POA: Diagnosis not present

## 2023-02-19 DIAGNOSIS — E1165 Type 2 diabetes mellitus with hyperglycemia: Secondary | ICD-10-CM | POA: Diagnosis not present

## 2023-02-19 DIAGNOSIS — Z794 Long term (current) use of insulin: Secondary | ICD-10-CM | POA: Diagnosis not present

## 2023-02-26 DIAGNOSIS — I10 Essential (primary) hypertension: Secondary | ICD-10-CM | POA: Diagnosis not present

## 2023-02-26 DIAGNOSIS — J4530 Mild persistent asthma, uncomplicated: Secondary | ICD-10-CM | POA: Diagnosis not present

## 2023-02-26 DIAGNOSIS — E559 Vitamin D deficiency, unspecified: Secondary | ICD-10-CM | POA: Diagnosis not present

## 2023-02-26 DIAGNOSIS — E782 Mixed hyperlipidemia: Secondary | ICD-10-CM | POA: Diagnosis not present

## 2023-02-26 DIAGNOSIS — E1165 Type 2 diabetes mellitus with hyperglycemia: Secondary | ICD-10-CM | POA: Diagnosis not present

## 2023-02-26 DIAGNOSIS — Z794 Long term (current) use of insulin: Secondary | ICD-10-CM | POA: Diagnosis not present

## 2023-03-13 ENCOUNTER — Ambulatory Visit (INDEPENDENT_AMBULATORY_CARE_PROVIDER_SITE_OTHER): Payer: Medicare Other | Admitting: Podiatry

## 2023-03-13 ENCOUNTER — Encounter: Payer: Self-pay | Admitting: Podiatry

## 2023-03-13 DIAGNOSIS — B351 Tinea unguium: Secondary | ICD-10-CM

## 2023-03-13 DIAGNOSIS — M79675 Pain in left toe(s): Secondary | ICD-10-CM

## 2023-03-13 DIAGNOSIS — E119 Type 2 diabetes mellitus without complications: Secondary | ICD-10-CM

## 2023-03-13 DIAGNOSIS — M2142 Flat foot [pes planus] (acquired), left foot: Secondary | ICD-10-CM

## 2023-03-13 DIAGNOSIS — M79674 Pain in right toe(s): Secondary | ICD-10-CM | POA: Diagnosis not present

## 2023-03-13 DIAGNOSIS — M2141 Flat foot [pes planus] (acquired), right foot: Secondary | ICD-10-CM

## 2023-03-13 DIAGNOSIS — R140 Abdominal distension (gaseous): Secondary | ICD-10-CM | POA: Insufficient documentation

## 2023-03-13 DIAGNOSIS — E1165 Type 2 diabetes mellitus with hyperglycemia: Secondary | ICD-10-CM
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
## 2023-03-21 DIAGNOSIS — M25562 Pain in left knee: Secondary | ICD-10-CM | POA: Diagnosis not present

## 2023-03-21 DIAGNOSIS — Z79891 Long term (current) use of opiate analgesic: Secondary | ICD-10-CM | POA: Diagnosis not present

## 2023-04-08 DIAGNOSIS — E119 Type 2 diabetes mellitus without complications: Secondary | ICD-10-CM | POA: Diagnosis not present

## 2023-04-08 DIAGNOSIS — E782 Mixed hyperlipidemia: Secondary | ICD-10-CM | POA: Diagnosis not present

## 2023-04-08 DIAGNOSIS — I10 Essential (primary) hypertension: Secondary | ICD-10-CM | POA: Diagnosis not present

## 2023-04-08 DIAGNOSIS — R0789 Other chest pain: Secondary | ICD-10-CM | POA: Diagnosis not present

## 2023-04-24 DIAGNOSIS — G894 Chronic pain syndrome: Secondary | ICD-10-CM | POA: Diagnosis not present

## 2023-04-24 DIAGNOSIS — J4530 Mild persistent asthma, uncomplicated: Secondary | ICD-10-CM | POA: Diagnosis not present

## 2023-04-24 DIAGNOSIS — E559 Vitamin D deficiency, unspecified: Secondary | ICD-10-CM | POA: Diagnosis not present

## 2023-04-24 DIAGNOSIS — E782 Mixed hyperlipidemia: Secondary | ICD-10-CM | POA: Diagnosis not present

## 2023-04-24 DIAGNOSIS — E1165 Type 2 diabetes mellitus with hyperglycemia: Secondary | ICD-10-CM | POA: Diagnosis not present

## 2023-04-24 DIAGNOSIS — I10 Essential (primary) hypertension: Secondary | ICD-10-CM | POA: Diagnosis not present

## 2023-04-24 DIAGNOSIS — Z794 Long term (current) use of insulin: Secondary | ICD-10-CM | POA: Diagnosis not present

## 2023-04-25 ENCOUNTER — Other Ambulatory Visit: Payer: Self-pay

## 2023-04-25 ENCOUNTER — Emergency Department (HOSPITAL_BASED_OUTPATIENT_CLINIC_OR_DEPARTMENT_OTHER)
Admission: EM | Admit: 2023-04-25 | Discharge: 2023-04-25 | Disposition: A | Discharge status: Home / Self Care | Payer: Medicare Other | Attending: Emergency Medicine | Admitting: Emergency Medicine

## 2023-04-25 ENCOUNTER — Encounter (HOSPITAL_BASED_OUTPATIENT_CLINIC_OR_DEPARTMENT_OTHER): Payer: Self-pay | Admitting: Emergency Medicine

## 2023-04-25 DIAGNOSIS — Z7982 Long term (current) use of aspirin: Secondary | ICD-10-CM | POA: Diagnosis not present

## 2023-04-25 DIAGNOSIS — M545 Low back pain, unspecified: Secondary | ICD-10-CM | POA: Insufficient documentation

## 2023-04-25 MED ORDER — OXYCODONE HCL 5 MG PO TABS
2.5000 mg | ORAL_TABLET | Freq: Once | ORAL | Status: AC
  Administered 2023-04-25: 2.5 mg via ORAL
  Filled 2023-04-25: qty 1

## 2023-04-25 MED ORDER — OXYCODONE-ACETAMINOPHEN 7.5-325 MG PO TABS: 1.0000 | ORAL_TABLET | Freq: Four times a day (QID) | ORAL | Status: DC

## 2023-04-25 MED ORDER — OXYCODONE-ACETAMINOPHEN 5-325 MG PO TABS
1.0000 | ORAL_TABLET | Freq: Once | ORAL | Status: AC
  Administered 2023-04-25: 1 via ORAL
  Filled 2023-04-25: qty 1

## 2023-04-25 NOTE — Discharge Instructions (Addendum)
Evaluation of your lower back pain was overall reassuring.  Suspect it is chronic.  Recommend you return to the pain clinic to discuss pain management moving forward.  In the meantime you can treat conservatively with ice, gentle stretching.  Also recommend Tylenol and ibuprofen.

## 2023-04-25 NOTE — ED Provider Notes (Signed)
[place] [facility] AT [facility] Provider Note   CSN: [id number] Arrival date & time: 04/25/23  1233     History  Chief Complaint  Patient presents with   Back Pain   HPI Breanna Berger is a 62 y.o. female with history of chronic back pain presenting for back pain.  States it has been going on for about a year.  Denies any new trauma.  States the pain has not changed in any way.  Denies urinary symptoms.  Normally seen at pain clinic for her back pain.  States she routinely takes "Percocet 7.5s".  States she had difficulty refilling her medications because she was late to an appointment.  Denies saddle anesthesia, denies fever, denies urinary or bowel changes.   Back Pain      Home Medications Prior to Admission medications   Medication Sig Start Date End Date Taking? Authorizing Provider  albuterol (PROVENTIL HFA;VENTOLIN HFA) 108 (90 BASE) MCG/ACT inhaler Inhale 2 puffs into the lungs every 6 (six) hours as needed for wheezing or shortness of breath. 10/07/13   Storm Frisk, MD  aspirin 81 MG chewable tablet     [provider]  atorvastatin (LIPITOR) 20 MG tablet Take 1 tablet by mouth at bedtime. 10/31/20   [provider]  Blood Glucose Monitoring Suppl (ACCU-CHEK GUIDE ME) w/Device KIT Use to check blood sugar 3 times a day 12/25/19   Carlus Pavlov, MD  Cholecalciferol 25 MCG (1000 UT) capsule Take by mouth. 10/29/18   [provider]  Continuous Blood Gluc Sensor (FREESTYLE LIBRE 2 SENSOR) MISC 1 Device by Does not apply route every 14 (fourteen) days. 07/27/21   Shamleffer, Konrad Dolores, MD  glucose blood (ACCU-CHEK GUIDE) test strip Use to check blood sugar 3 times a day. 12/25/19   Carlus Pavlov, MD  icosapent Ethyl (VASCEPA) 1 g capsule     [provider]  Insulin Pen Needle (BD PEN NEEDLE MICRO U/F) 32G X 6 MM MISC 1 Device by Does not apply route daily in the afternoon. 10/25/21   Shamleffer,  Konrad Dolores, MD  LANTUS SOLOSTAR 100 UNIT/ML Solostar Pen Inject 36 Units into the skin at bedtime. 10/25/21   Shamleffer, Konrad Dolores, MD  LINZESS 290 MCG CAPS capsule Take 290 mcg by mouth daily. 02/09/20   [provider]  metFORMIN (GLUCOPHAGE) 1000 MG tablet Take 1 tablet (1,000 mg total) by mouth 2 (two) times daily with a meal. 10/25/21   Shamleffer, Konrad Dolores, MD  metoprolol succinate (TOPROL-XL) 50 MG 24 hr tablet Take 50 mg by mouth daily. Take with or immediately following a meal.    [provider]  nitroGLYCERIN (NITROSTAT) 0.4 MG SL tablet  03/16/20   [provider]  NONFORMULARY OR COMPOUNDED ITEM Antifungal solution: Terbinafine 3%, Fluconazole 2%, Tea Tree Oil 5%, Urea 10%, Ibuprofen 2% in DMSO suspension #58mL 08/02/20   Vivi Barrack, DPM  Olopatadine HCl 0.2 % SOLN     [provider]  oxyCODONE-acetaminophen (PERCOCET) 7.5-325 MG tablet Take 1 tablet by mouth 4 (four) times daily. 02/23/21   [provider]  tirzepatide [facility]) 5 MG/0.5ML Pen Inject 5 mg into the skin once a week. 10/25/21   Shamleffer, Konrad Dolores, MD      Allergies    Erythromycin, Keflex [cephalexin], and Doxycycline hyclate    Review of Systems   Review of Systems  Musculoskeletal:  Positive for back pain.    Physical Exam Updated Vital Signs BP (!) 157/89 (  BP Location: Right Arm)   Pulse 83   Temp 97.9 F (36.6 C)   Resp 16   SpO2 99%  Physical Exam Constitutional:      Appearance: Normal appearance.  HENT:     Head: Normocephalic.     Nose: Nose normal.  Eyes:     Conjunctiva/sclera: Conjunctivae normal.  Pulmonary:     Effort: Pulmonary effort is normal.  Musculoskeletal:     Lumbar back: Tenderness present.     Comments: Mild generalized tenderness about the lumbar spine and paraspinal regions bilaterally.  No CVA tenderness.  No tenderness of the midline.  Neurological:     Mental Status: She is alert.   Psychiatric:        Mood and Affect: Mood normal.     ED Results / Procedures / Treatments   Labs (all labs ordered are listed, but only abnormal results are displayed) Labs Reviewed - No data to display  EKG None  Radiology No results found.  Procedures Procedures    Medications Ordered in ED Medications  oxyCODONE-acetaminophen (PERCOCET/ROXICET) 5-325 MG per tablet 1 tablet (has no administration in time range)    And  oxyCODONE (Oxy IR/ROXICODONE) immediate release tablet 2.5 mg (has no administration in time range)    ED Course/ Medical Decision Making/ A&P                                 Medical Decision Making Risk Prescription drug management.   62 year old well-appearing female presenting for chronic back pain.  Exam notable for generalized lower back tenderness.  Suspect this is chronic given lack of red flag symptoms and pain that she has had for over a year now.  Also without new trauma.  Treated with Percocet.  Advised to return to the pain clinic to discuss management moving forward.  Vital stable.  Discharged home with condition.        Final Clinical Impression(s) / ED Diagnoses Final diagnoses:  Low back pain, unspecified back pain laterality, unspecified chronicity, unspecified whether sciatica present    Rx / DC Orders ED Discharge Orders     None         Gareth Eagle, PA-C 04/25/23 1552    Melene Plan, DO 04/25/23 1656

## 2023-04-25 NOTE — ED Notes (Signed)

## 2023-04-25 NOTE — ED Notes (Signed)
Pt provided with peanut butter crackers per request

## 2023-04-25 NOTE — ED Triage Notes (Signed)
Lower back pain into right leg. Started Sunday Seen at pain management they did not fill her medication Out of her medication

## 2023-05-16 DIAGNOSIS — J40 Bronchitis, not specified as acute or chronic: Secondary | ICD-10-CM | POA: Diagnosis not present

## 2023-05-16 DIAGNOSIS — J069 Acute upper respiratory infection, unspecified: Secondary | ICD-10-CM | POA: Diagnosis not present

## 2023-05-16 DIAGNOSIS — R051 Acute cough: Secondary | ICD-10-CM | POA: Diagnosis not present

## 2023-05-20 ENCOUNTER — Ambulatory Visit (INDEPENDENT_AMBULATORY_CARE_PROVIDER_SITE_OTHER): Payer: Medicare Other

## 2023-05-20 ENCOUNTER — Ambulatory Visit: Payer: Medicare Other | Admitting: Podiatry

## 2023-05-20 ENCOUNTER — Encounter: Payer: Self-pay | Admitting: Podiatry

## 2023-05-20 VITALS — Ht 69.0 in | Wt 279.0 lb

## 2023-05-20 DIAGNOSIS — S91302A Unspecified open wound, left foot, initial encounter: Secondary | ICD-10-CM | POA: Diagnosis not present

## 2023-05-20 DIAGNOSIS — L923 Foreign body granuloma of the skin and subcutaneous tissue: Secondary | ICD-10-CM | POA: Diagnosis not present

## 2023-05-20 DIAGNOSIS — M79675 Pain in left toe(s): Secondary | ICD-10-CM | POA: Diagnosis not present

## 2023-05-20 DIAGNOSIS — M79674 Pain in right toe(s): Secondary | ICD-10-CM | POA: Diagnosis not present

## 2023-05-20 DIAGNOSIS — E1165 Type 2 diabetes mellitus with hyperglycemia: Secondary | ICD-10-CM

## 2023-05-20 DIAGNOSIS — B351 Tinea unguium: Secondary | ICD-10-CM | POA: Diagnosis not present

## 2023-05-20 MED ORDER — GENTAMICIN SULFATE 0.1 % EX CREA: TOPICAL_CREAM | CUTANEOUS | 0 refills | Status: DC

## 2023-05-20 NOTE — Patient Instructions (Addendum)
CONTINUE ANTIBIOTICS YOU HAVE BEEN PRESCRIBED UNTIL ALL ARE GONE.  DRESSING CHANGES LEFT FOOT:   PHARMACY SHOPPING LIST: Wound Cleanser OR DIAL ANTIBACTERIAL SOAP 2 x 2 inch sterile gauze for cleaning wound Gentamicin Cream  KEEP LEFT FOOT DRY AT ALL TIMES!!!!  CLEANSE LEFT FOOT WITH ANTIBACTERIAL SOAP.  DAB DRY WITH GAUZE SPONGE.  APPLY A LIGHT AMOUNT OF Gentamicin Cream TO LEFT FOOT  APPLY FABRIC BAND-AID  DO NOT WALK BAREFOOT!!!  IF YOU EXPERIENCE ANY FEVER, CHILLS, NIGHTSWEATS, NAUSEA OR VOMITING, ELEVATED OR LOW BLOOD SUGARS, REPORT TO EMERGENCY ROOM.  IF YOU EXPERIENCE INCREASED REDNESS, PAIN, SWELLING, DISCOLORATION, ODOR, PUS, DRAINAGE OR WARMTH OF YOUR FOOT, REPORT TO EMERGENCY ROOM.  Skin Foreign Body A skin foreign body is an object that is stuck in the skin. Common objects that get stuck in the skin include: Wood splinters. Glass or fiberglass slivers. Rocks or gravel. Metallic objects, such as nails, needles, fish hooks, and BBs. Thorns and cactus spines. Foreign bodies may damage tissue or cause infection. If the foreign body does not cause any pain or infection, it may be okay to leave it in the skin. A growth called a granuloma may form around a foreign body that is left in the skin. What are the causes? This condition is caused by an object getting lodged under the skin, usually by accident. What increases the risk? Children who play in areas with wood, metal, or glass are at a higher risk of getting a foreign body. Adults may get a skin foreign body after breaking glass or while working with wood, fiberglass, or stone. In some cases, the object may get stuck in an open wound after an injury. What are the signs or symptoms? Symptoms of this condition include: Pain or tenderness. A feeling of something being stuck under the skin. Redness. Swelling. How is this diagnosed? This condition is diagnosed based on: Your medical history and symptoms. A  physical exam. Imaging tests, such as: X-rays. CT scans. Ultrasounds. How is this treated? Treatment for this condition depends on what the foreign body is, where it is, and whether it is causing infection or other symptoms. Treatment may involve: Flushing the affected area with a salt water solution to remove dirt or debris. Removing all or part of the object with a needle and metal tweezers. In some cases, an incision may be made in the skin to allow access to the object. Waiting to remove the object until it moves closer to the surface of the skin. This may take several days. Leaving the object in place. This may be done if the object is not causing any symptoms or if removal will cause more damage to the skin or tissue. Taking antibiotic medicines or using antibiotic ointment to treat or prevent infection. Having a surgical procedure to remove a foreign body that is deep inside the tissue or that has been covered by a granuloma. Follow these instructions at home: Wound or incision care  If the foreign body was removed, follow instructions from your health care provider about how to take care of your wound or incision. Make sure you: Wash your hands with soap and water for at least 20 seconds before and after you change your bandage (dressing). If soap and water are not available, use hand sanitizer. Change your dressing as told by your health care provider. Leave stitches (sutures), skin glue, or adhesive strips in place. These skin closures may need to stay in place for 2 weeks or  longer. If adhesive strip edges start to loosen and curl up, you may trim the loose edges. Do not remove adhesive strips completely unless your health care provider tells you to do that. Check your wound or incision every day for signs of infection. This is especially important if the foreign body was left in place in the skin. Check for: More redness, swelling, or pain. More fluid or blood. Warmth. Pus or a  bad smell. If the foreign body was in your lip, you may be directed to rinse your mouth with a salt water mixture 3-4 times per day or as needed. To make salt water, completely dissolve -1 tsp (3-6 g) of salt in 1 cup (237 mL) of warm water. General instructions Take over-the-counter and prescription medicines only as told by your health care provider. If you were prescribed an antibiotic medicine or ointment, use it as told by your health care provider. Do not stop using the antibiotic even if you start to feel better. Keep all follow-up visits. This is important. Contact a health care provider if: You develop more pain or other new symptoms around the area where the object entered the skin. You have more redness, swelling, or pain around your wound or incision. You have more fluid or blood coming from your wound or incision. Your wound or incision feels warm to the touch. You have pus or a bad smell coming from your wound or incision. You have a fever. Get help right away if: You have severe pain that does not get better with medicine. Summary A skin foreign body is an object that is stuck in the skin. Common objects that get stuck in the skin include wood, glass, rocks, thorns, and fiberglass slivers. Treatment for this condition depends on what the foreign body is, where it is, and whether it is causing infection or other symptoms. Treatment may include removing the foreign body or leaving it in place. It is important to watch the wound or incision for signs of infection, especially if the object was left in place in the skin. This information is not intended to replace advice given to you by your health care provider. Make sure you discuss any questions you have with your health care provider. Document Revised: 02/17/2021 Document Reviewed: 02/17/2021 Elsevier Patient Education  2024 [employer].

## 2023-05-23 NOTE — Progress Notes (Signed)
Subjective:  Patient ID: Breanna Berger, female    DOB: 1960/08/31,  MRN: [id number]  62 y.o. female presents preventative diabetic foot care and painful elongated mycotic toenails 1-5 bilaterally which are tender when wearing enclosed shoe gear. Pain is relieved with periodic professional debridement. Chief Complaint  Patient presents with   Nail Problem    Pt is here for [facility], last A1C was 8, PCP is Dr Erenest Rasher and LOV was a week ago.   New problem(s): with chief concern of injury to left foot. Injury occurred several days ago. Injury recurred as a result of stepping on a piece of broken glass at home. Patient states aggravating factor(s) is/are weight bearing.  Patient is on Amoxicillin.  PCP is Norm Salt, [place].  Allergies  Allergen Reactions   Erythromycin Hives, Itching and Swelling   Keflex [Cephalexin] Hives, Itching and Swelling   Doxycycline Hyclate Rash    Review of Systems: Negative except as noted in the HPI.   Objective:  Breanna Berger is a pleasant 62 y.o. female morbidly obese in NAD. AAO x 3.  Vascular Examination: Capillary refill time to digits immediate b/l. Palpable PT pulse(s) b/l LE. Palpable DP pulse(s) right foot Faintly palpable DP pulse(s) left foot. No pain with calf compression b/l. Lower extremity skin temperature gradient within normal limits. No cyanosis or clubbing noted b/l LE.  Neurological Examination: Sensation grossly intact b/l with 10 gram monofilament. Vibratory sensation intact b/l.  Dermatological Examination: Laceration noted plantarlateral aspect of left foot. No erythema, no edema, no drainage, no fluctuance.  Pedal skin with normal turgor, texture and tone b/l. No interdigital macerations noted. Toenails 1-5 b/l thick, discolored, elongated with subungual debris and pain on dorsal palpation. No hyperkeratotic lesions noted b/l.   Musculoskeletal Examination: Muscle strength 5/5 to b/l LE.  No pain, crepitus noted b/l. No  gross pedal deformities. Patient ambulates independently without assistive aids.   Xray findings left foot: No gas in tissues left foot. No evidence of fracture left foot. No foreign body evident left foot.   Last A1c:       No data to display           Assessment:   1. Pain due to onychomycosis of toenails of both feet   2. Foreign body granuloma of skin and subcutaneous tissue   3. Open wound of left foot, initial encounter   4. Uncontrolled type 2 diabetes mellitus with hyperglycemia (HCC)    Plan:  -Consent given for treatment as described below: -Examined patient. -Continue amoxicillin until all are gone. -Rx sent to pharmacy for Gentamicin Cream to apply to left foot once daily. Dispensed printed instructions. -Xrays left foot taken and reviewed with patient. -Patient to continue soft, supportive shoe gear daily. -Toenails 1-5 b/l were debrided in length and girth with sterile nail nippers and dremel without iatrogenic bleeding.  -Patient scheduled to see Dr. Ardelle Anton in 3 weeks  for follow up of open wound left foot secondary to FBO injury. -Patient/POA to call should there be question/concern in the interim.  Return in about 9 weeks (around 07/22/2023).  Freddie Breech, DPM      [facility] LOCATION: [address], [place] [address]  Office [phone]   [facility] LOCATION: [address] [place], [place] [address] Office [phone]

## 2023-05-28 ENCOUNTER — Ambulatory Visit: Payer: Medicare Other | Admitting: Podiatry

## 2023-05-29 DIAGNOSIS — I10 Essential (primary) hypertension: Secondary | ICD-10-CM | POA: Diagnosis not present

## 2023-05-29 DIAGNOSIS — J069 Acute upper respiratory infection, unspecified: Secondary | ICD-10-CM | POA: Diagnosis not present

## 2023-05-29 DIAGNOSIS — J4530 Mild persistent asthma, uncomplicated: Secondary | ICD-10-CM | POA: Diagnosis not present

## 2023-05-29 DIAGNOSIS — Z794 Long term (current) use of insulin: Secondary | ICD-10-CM | POA: Diagnosis not present

## 2023-05-29 DIAGNOSIS — E782 Mixed hyperlipidemia: Secondary | ICD-10-CM | POA: Diagnosis not present

## 2023-05-29 DIAGNOSIS — E559 Vitamin D deficiency, unspecified: Secondary | ICD-10-CM | POA: Diagnosis not present

## 2023-05-29 DIAGNOSIS — E1165 Type 2 diabetes mellitus with hyperglycemia: Secondary | ICD-10-CM | POA: Diagnosis not present

## 2023-06-07 DIAGNOSIS — Z794 Long term (current) use of insulin: Secondary | ICD-10-CM | POA: Diagnosis not present

## 2023-06-07 DIAGNOSIS — E1165 Type 2 diabetes mellitus with hyperglycemia: Secondary | ICD-10-CM | POA: Diagnosis not present

## 2023-06-18 ENCOUNTER — Ambulatory Visit: Payer: Medicare Other | Admitting: Podiatry

## 2023-06-26 DIAGNOSIS — M25561 Pain in right knee: Secondary | ICD-10-CM | POA: Diagnosis not present

## 2023-06-26 DIAGNOSIS — M545 Low back pain, unspecified: Secondary | ICD-10-CM | POA: Diagnosis not present

## 2023-06-26 DIAGNOSIS — G894 Chronic pain syndrome: Secondary | ICD-10-CM | POA: Diagnosis not present

## 2023-06-26 DIAGNOSIS — Z79891 Long term (current) use of opiate analgesic: Secondary | ICD-10-CM | POA: Diagnosis not present

## 2023-07-03 DIAGNOSIS — B349 Viral infection, unspecified: Secondary | ICD-10-CM | POA: Diagnosis not present

## 2023-07-03 DIAGNOSIS — U071 COVID-19: Secondary | ICD-10-CM | POA: Diagnosis not present

## 2023-07-17 ENCOUNTER — Ambulatory Visit: Payer: Medicare Other | Admitting: Podiatry

## 2023-07-17 DIAGNOSIS — Z794 Long term (current) use of insulin: Secondary | ICD-10-CM | POA: Diagnosis not present

## 2023-07-17 DIAGNOSIS — E559 Vitamin D deficiency, unspecified: Secondary | ICD-10-CM | POA: Diagnosis not present

## 2023-07-17 DIAGNOSIS — E1165 Type 2 diabetes mellitus with hyperglycemia: Secondary | ICD-10-CM | POA: Diagnosis not present

## 2023-07-17 DIAGNOSIS — I10 Essential (primary) hypertension: Secondary | ICD-10-CM | POA: Diagnosis not present

## 2023-07-17 DIAGNOSIS — Z0001 Encounter for general adult medical examination with abnormal findings: Secondary | ICD-10-CM | POA: Diagnosis not present

## 2023-07-17 DIAGNOSIS — E782 Mixed hyperlipidemia: Secondary | ICD-10-CM | POA: Diagnosis not present

## 2023-07-24 DIAGNOSIS — G894 Chronic pain syndrome: Secondary | ICD-10-CM | POA: Diagnosis not present

## 2023-07-24 DIAGNOSIS — M545 Low back pain, unspecified: Secondary | ICD-10-CM | POA: Diagnosis not present

## 2023-07-24 DIAGNOSIS — Z79891 Long term (current) use of opiate analgesic: Secondary | ICD-10-CM | POA: Diagnosis not present

## 2023-07-24 DIAGNOSIS — M25561 Pain in right knee: Secondary | ICD-10-CM | POA: Diagnosis not present

## 2023-08-07 ENCOUNTER — Ambulatory Visit: Payer: Medicare Other | Admitting: Skilled Nursing Facility1

## 2023-08-19 ENCOUNTER — Ambulatory Visit (INDEPENDENT_AMBULATORY_CARE_PROVIDER_SITE_OTHER): Payer: Medicare Other | Admitting: Podiatry

## 2023-08-19 DIAGNOSIS — Z91199 Patient's noncompliance with other medical treatment and regimen due to unspecified reason: Secondary | ICD-10-CM

## 2023-08-19 NOTE — Progress Notes (Signed)
 1. No-show for appointment

## 2023-08-21 DIAGNOSIS — M79672 Pain in left foot: Secondary | ICD-10-CM | POA: Diagnosis not present

## 2023-08-21 DIAGNOSIS — Z79891 Long term (current) use of opiate analgesic: Secondary | ICD-10-CM | POA: Diagnosis not present

## 2023-08-21 DIAGNOSIS — M545 Low back pain, unspecified: Secondary | ICD-10-CM | POA: Diagnosis not present

## 2023-08-21 DIAGNOSIS — M25561 Pain in right knee: Secondary | ICD-10-CM | POA: Diagnosis not present

## 2023-08-21 DIAGNOSIS — G894 Chronic pain syndrome: Secondary | ICD-10-CM | POA: Diagnosis not present

## 2023-08-21 DIAGNOSIS — Z1231 Encounter for screening mammogram for malignant neoplasm of breast: Secondary | ICD-10-CM | POA: Diagnosis not present

## 2023-09-18 DIAGNOSIS — G894 Chronic pain syndrome: Secondary | ICD-10-CM | POA: Diagnosis not present

## 2023-09-18 DIAGNOSIS — G8929 Other chronic pain: Secondary | ICD-10-CM | POA: Diagnosis not present

## 2023-09-18 DIAGNOSIS — Z79891 Long term (current) use of opiate analgesic: Secondary | ICD-10-CM | POA: Diagnosis not present

## 2023-09-18 DIAGNOSIS — M79672 Pain in left foot: Secondary | ICD-10-CM | POA: Diagnosis not present

## 2023-09-18 DIAGNOSIS — M25561 Pain in right knee: Secondary | ICD-10-CM | POA: Diagnosis not present

## 2023-09-20 ENCOUNTER — Telehealth: Payer: Self-pay

## 2023-09-20 NOTE — Telephone Encounter (Signed)
 Patient aware that we received a message about a software error by the lab that we were using before 04/2023. The Urine Microalbumin to Creatinine Ratio (urinary proteins) result was miscalculated. After 04/2023, we started to use [facility] and the issue was corrected. We reviewed your urinary protein result and, since it was checked by the previous lab, we are planning to repeat the test at your next visit. In the meantime if you prefer, we can get you retested sooner - please let us know.  We apologize for the inconvenience! [facility]

## 2023-09-23 ENCOUNTER — Ambulatory Visit (INDEPENDENT_AMBULATORY_CARE_PROVIDER_SITE_OTHER): Admitting: Podiatry

## 2023-09-23 DIAGNOSIS — Z91198 Patient's noncompliance with other medical treatment and regimen for other reason: Secondary | ICD-10-CM

## 2023-09-23 NOTE — Progress Notes (Signed)
 1. Failure to attend appointment with reason given    Rescheduled due to late check in per front desk.

## 2023-09-26 ENCOUNTER — Ambulatory Visit: Admitting: Podiatry

## 2023-09-26 ENCOUNTER — Encounter: Payer: Self-pay | Admitting: Podiatry

## 2023-09-26 DIAGNOSIS — M79675 Pain in left toe(s): Secondary | ICD-10-CM | POA: Diagnosis not present

## 2023-09-26 DIAGNOSIS — E1165 Type 2 diabetes mellitus with hyperglycemia: Secondary | ICD-10-CM | POA: Diagnosis not present

## 2023-09-26 DIAGNOSIS — B351 Tinea unguium: Secondary | ICD-10-CM

## 2023-09-26 DIAGNOSIS — M79674 Pain in right toe(s): Secondary | ICD-10-CM | POA: Diagnosis not present

## 2023-09-26 NOTE — Progress Notes (Signed)
This patient returns to my office for at risk foot care.  This patient requires this care by a professional since this patient will be at risk due to having diabetes.  This patient is unable to cut nails herself since the patient cannot reach her nails.These nails are painful walking and wearing shoes.  This patient presents for at risk foot care today.  General Appearance  Alert, conversant and in no acute stress.  Vascular  Dorsalis pedis and posterior tibial  pulses are palpable  bilaterally.  Capillary return is within normal limits  bilaterally. Temperature is within normal limits  bilaterally.  Neurologic  Senn-Weinstein monofilament wire test within normal limits  bilaterally. Muscle power within normal limits bilaterally.  Nails Thick disfigured discolored nails with subungual debris  from hallux to fifth toes bilaterally. No evidence of bacterial infection or drainage bilaterally.  Orthopedic  No limitations of motion  feet .  No crepitus or effusions noted.  No bony pathology or digital deformities noted.  Skin  normotropic skin with no porokeratosis noted bilaterally.  No signs of infections or ulcers noted.     Onychomycosis  Pain in right toes  Pain in left toes  Consent was obtained for treatment procedures.   Mechanical debridement of nails 1-5  bilaterally performed with a nail nipper.  Filed with dremel without incident.    Return office visit     10 weeks                 Told patient to return for periodic foot care and evaluation due to potential at risk complications.   Lennyn Bellanca DPM   

## 2023-10-23 DIAGNOSIS — Z79891 Long term (current) use of opiate analgesic: Secondary | ICD-10-CM | POA: Diagnosis not present

## 2023-10-23 DIAGNOSIS — M545 Low back pain, unspecified: Secondary | ICD-10-CM | POA: Diagnosis not present

## 2023-10-23 DIAGNOSIS — M79672 Pain in left foot: Secondary | ICD-10-CM | POA: Diagnosis not present

## 2023-10-23 DIAGNOSIS — G894 Chronic pain syndrome: Secondary | ICD-10-CM | POA: Diagnosis not present

## 2023-10-23 DIAGNOSIS — M25561 Pain in right knee: Secondary | ICD-10-CM | POA: Diagnosis not present

## 2023-11-13 DIAGNOSIS — L03818 Cellulitis of other sites: Secondary | ICD-10-CM | POA: Diagnosis not present

## 2023-11-22 DIAGNOSIS — G894 Chronic pain syndrome: Secondary | ICD-10-CM | POA: Diagnosis not present

## 2023-11-22 DIAGNOSIS — M545 Low back pain, unspecified: Secondary | ICD-10-CM | POA: Diagnosis not present

## 2023-11-22 DIAGNOSIS — M25561 Pain in right knee: Secondary | ICD-10-CM | POA: Diagnosis not present

## 2023-11-22 DIAGNOSIS — M79672 Pain in left foot: Secondary | ICD-10-CM | POA: Diagnosis not present

## 2023-11-22 DIAGNOSIS — Z79891 Long term (current) use of opiate analgesic: Secondary | ICD-10-CM | POA: Diagnosis not present

## 2023-11-22 DIAGNOSIS — G8929 Other chronic pain: Secondary | ICD-10-CM | POA: Diagnosis not present

## 2023-11-27 DIAGNOSIS — E782 Mixed hyperlipidemia: Secondary | ICD-10-CM | POA: Diagnosis not present

## 2023-11-27 DIAGNOSIS — Z794 Long term (current) use of insulin: Secondary | ICD-10-CM | POA: Diagnosis not present

## 2023-11-27 DIAGNOSIS — E559 Vitamin D deficiency, unspecified: Secondary | ICD-10-CM | POA: Diagnosis not present

## 2023-11-27 DIAGNOSIS — E1165 Type 2 diabetes mellitus with hyperglycemia: Secondary | ICD-10-CM | POA: Diagnosis not present

## 2023-11-27 DIAGNOSIS — I10 Essential (primary) hypertension: Secondary | ICD-10-CM | POA: Diagnosis not present

## 2023-11-27 DIAGNOSIS — Z0001 Encounter for general adult medical examination with abnormal findings: Secondary | ICD-10-CM | POA: Diagnosis not present

## 2023-12-05 ENCOUNTER — Ambulatory Visit: Admitting: Podiatry

## 2023-12-17 ENCOUNTER — Telehealth: Payer: Self-pay

## 2023-12-17 DIAGNOSIS — E782 Mixed hyperlipidemia: Secondary | ICD-10-CM | POA: Diagnosis not present

## 2023-12-17 DIAGNOSIS — Z794 Long term (current) use of insulin: Secondary | ICD-10-CM | POA: Diagnosis not present

## 2023-12-17 DIAGNOSIS — E1165 Type 2 diabetes mellitus with hyperglycemia: Secondary | ICD-10-CM | POA: Diagnosis not present

## 2023-12-17 DIAGNOSIS — I10 Essential (primary) hypertension: Secondary | ICD-10-CM | POA: Diagnosis not present

## 2023-12-17 DIAGNOSIS — E559 Vitamin D deficiency, unspecified: Secondary | ICD-10-CM | POA: Diagnosis not present

## 2023-12-17 NOTE — Progress Notes (Addendum)
 12/17/2023 Name: Breanna Berger MRN: [id number] DOB: 10/25/1960  Chief Complaint  Patient presents with   Diabetes    Breanna Berger is a 63 y.o. year old female who was referred for medication management by their primary care provider, Breanna Bare, MD. They presented for a face to face visit today.   They were referred to the pharmacist by their PCP for assistance in managing diabetes    Subjective: Patient reports fasting blood glucose consistently > 200 mg/dL, despite taking Lantus  30 units nightly. She reports only eating one large meal a day between late afternoon and early evening. She takes Humulin R  6 units prior to this meal. She also reports only taking Metformin  1000mg  once daily, and not on a daily basis. She reports feeling like the Humulin R  works well and she can feel her sugar come down but she doesn't feel like the Lantus  is working. She describes proper administration techniques. Patient saw an endocrinologist in 2023 who started Mounjaro . She stopped it after titrating up to 5 mg due to bloating. She now reports the bloating has persisted, so she's unsure if it was related to the Mounjaro . She was also on a CGM (Freestyle Libre 2) at that time, but reported arm irritation.   Care Team: Primary Care Provider: Catalina Bare, MD ; Next Scheduled Visit: 12/31/23   Medication Access/Adherence  Current Pharmacy:  [id number] - [place], [place] - [address] AT [facility] [address] [place] [address] Phone: [phone] Fax: [phone]  [facility] - [place], [place] - [address] [place] [address] Phone: [phone] Fax: [phone]   Patient reports affordability concerns with their medications: No  Patient reports access/transportation concerns to their pharmacy: No  Patient reports adherence concerns with their medications:  Yes      Diabetes: Patient is not compliant to current regimen as prescribed, listed below as she is currently taking  Current medications: Humulin R  6 units once daily with largest meal, Lantus  30 units daily, Metformin  1000 mg once daily  Medications tried in the past: Mounjaro  - self-discontinued due to bloating, Humulin NPH  *according to fill history, patient last filled Humulin R  (vial) and Humulin NPH (kwikpen) back in 2024  Current glucose readings: 259 mg/dL POC today, pt reportedly fasting Using Accu-chek Guide meter; testing once times daily, but not consistently    Patient denies hypoglycemic s/sx including dizziness, shakiness, sweating. Patient reports hyperglycemic symptoms including polyuria, and polydipsia.  Current meal patterns: only eats once daily, large meal between lunch and dinner - Breakfast: she typically skips but will have coffee with creamer (prefers CoffeeMate [place]) - Lunch: seafood (fried fish, fried shrimp, etc.), mashed potatoes and gravy, large Tea/Lemonade  - Supper: salad (started more recently)  - Snacks not often - but chips/cookies  - Drinks: Juices, Tea, Lemonade (does not drink diet/sugar free beverages), ~50 oz of water daily  Current physical activity: none presently due to physical limitations   Current medication access support: N/A     Objective:   Medications Reviewed Today     Reviewed by Breanna Berger, [facility] (Pharmacist) on 12/17/23 at 1613  Med List Status: <None>   Medication Order Taking? Sig Documenting Provider Last Dose Status Informant  albuterol  (PROVENTIL  HFA;VENTOLIN  HFA) 108 (90 BASE) MCG/ACT inhaler [id number] Yes Inhale 2 puffs into the lungs every 6 (six) hours as needed for wheezing or shortness of breath. Breanna Belvie BRAVO, MD  Active Self           Med Note STARLENE, [name]   Fri Nov 06, 2019  8:27 AM) PRN  albuterol  (PROVENTIL ) (2.5 MG/3ML) 0.083% nebulizer solution [id number] Yes Take 2.5 mg by  nebulization every 6 (six) hours as needed for wheezing or shortness of breath. [provider]  Active   aspirin 81 MG chewable tablet [id number]   [provider]  Active     Discontinued 12/17/23 1543 (Dose change)   atorvastatin (LIPITOR) 40 MG tablet [id number] Yes Take 40 mg by mouth daily. [provider]  Active   Blood Glucose Monitoring Suppl (ACCU-CHEK GUIDE ME) w/Device KIT [id number] Yes Use to check blood sugar 3 times a day Trixie File, MD  Active     Discontinued 12/17/23 1555 (Patient Preference)     Discontinued 12/17/23 1555 (Expired Prescription)   ergocalciferol (VITAMIN D2) 1.25 MG (50000 UT) capsule [id number] Yes Take 50,000 Units by mouth once a week. [provider]  Active   fluticasone  (FLONASE ) 50 MCG/ACT nasal spray [id number] Yes 2 sprays Once Daily. [provider]  Active   fluticasone -salmeterol (ADVAIR) 250-50 MCG/ACT AEPB [id number]  Inhale 1 puff into the lungs in the morning and at bedtime.  Patient not taking: Reported on 12/17/2023   [provider]  Active     Discontinued 12/17/23 1555 (Completed Course)   glucose blood (ACCU-CHEK GUIDE) test strip [id number] Yes Use to check blood sugar 3 times a day. Trixie File, MD  Active      Discontinued 12/17/23 1556 (Patient has not taken in last 30 days)   Insulin  Pen Needle (BD PEN NEEDLE MICRO U/F) 32G X 6 MM MISC [id number] Yes 1 Device by Does not apply route daily in the afternoon. Shamleffer, Donell Cardinal, MD  Active   insulin  regular (NOVOLIN R) 100 units/mL injection [id number] Yes Inject 5 Units into the skin 3 (three) times daily before meals. [provider]  Active   LANTUS  SOLOSTAR 100 UNIT/ML Solostar Pen [id number] Yes Inject 36 Units into the skin at bedtime.  Patient taking differently: Inject 25 Units into the skin at bedtime.   Shamleffer, Ibtehal Jaralla, MD  Active   linaclotide [facility]) 145 MCG CAPS capsule [id number] Yes Take  1 capsule by mouth daily. [provider]  Active     Discontinued 12/17/23 1543 (Dose change)   metFORMIN  (GLUCOPHAGE ) 1000 MG tablet [id number] Yes Take 1 tablet (1,000 mg total) by mouth 2 (two) times daily with a meal.  Patient taking differently: Take 1,000 mg by mouth daily.   Shamleffer, Ibtehal Jaralla, MD  Active   metoprolol  succinate (TOPROL -XL) 50 MG 24 hr tablet [id number] Yes Take 50 mg by mouth daily. Take with or immediately following a meal. [provider]  Active Self  nitroGLYCERIN (NITROSTAT) 0.4 MG SL tablet [id number]   [provider]  Active   NONFORMULARY OR COMPOUNDED ITEM [id number]  Antifungal solution: Terbinafine 3%, Fluconazole  2%, Tea Tree Oil 5%, Urea 10%, Ibuprofen  2% in DMSO suspension #30mL Wagoner, Matthew R, DPM  Active      Discontinued 12/17/23 1600 (Patient has not taken in last 30 days)   oxyCODONE -acetaminophen  (PERCOCET) 7.5-325 MG tablet [id number] Yes Take 1 tablet by mouth 4 (four) times daily. [provider]  Active     Discontinued 12/17/23 1559 (Patient has not taken in last 30 days)               Assessment/Plan:   Diabetes: -  Currently uncontrolled, A1c was 10.7% in June 2025 - Reviewed long term cardiovascular and renal outcomes of uncontrolled blood sugar - Reviewed goal A1c, goal fasting, and goal 2 hour post prandial glucose - Reviewed dietary modifications including eating three small meals daily with healthy snacks, increasing fruit and vegetable intake, reducing simple carbs intake. We discussed trying Crystal Light to increase water intake and give the flavor or tea and lemonade without the added sugar. Provided small meal ideas, patient agreeable to trial them. - Reviewed lifestyle modifications including: increasing physical activity if capable, discussed options for chair exercises, etc.  - Due to patient's noncompliance and poor glycemic control, recommend transitioning back to CGM (Libre 3  Plus) and a re-trial of Mounjaro . Educated on improvements of CGM and GI side effect profile of Mounjaro , patient is agreeable to both. - Recommend to discontinue Humulin R  and initiate Mounjaro  2.5 mg weekly to be titrated up as tolerated, and continue Lantus  25 units daily and Metformin  1000 mg twice daily.  - Patient denies personal or family history of multiple endocrine neoplasia type 2, medullary thyroid  cancer; personal history of pancreatitis or gallbladder disease. - Recommend to check glucose continuously via Freestyle Libre 3 Plus.  - Will collaborate with PCP to get adjust therapy based on recommendations, if he deems clinically appropriate.    Follow Up Plan: Will follow up with patient in 2 weeks on 12/31/23.   Heather Factor, PharmD Clinical Pharmacist  [phone]

## 2023-12-20 DIAGNOSIS — Z79891 Long term (current) use of opiate analgesic: Secondary | ICD-10-CM | POA: Diagnosis not present

## 2023-12-20 DIAGNOSIS — M79672 Pain in left foot: Secondary | ICD-10-CM | POA: Diagnosis not present

## 2023-12-20 DIAGNOSIS — G894 Chronic pain syndrome: Secondary | ICD-10-CM | POA: Diagnosis not present

## 2023-12-20 DIAGNOSIS — M545 Low back pain, unspecified: Secondary | ICD-10-CM | POA: Diagnosis not present

## 2023-12-20 DIAGNOSIS — M25561 Pain in right knee: Secondary | ICD-10-CM | POA: Diagnosis not present

## 2023-12-24 DIAGNOSIS — E1165 Type 2 diabetes mellitus with hyperglycemia: Secondary | ICD-10-CM | POA: Diagnosis not present

## 2023-12-24 DIAGNOSIS — E782 Mixed hyperlipidemia: Secondary | ICD-10-CM | POA: Diagnosis not present

## 2023-12-24 DIAGNOSIS — Z794 Long term (current) use of insulin: Secondary | ICD-10-CM | POA: Diagnosis not present

## 2023-12-24 DIAGNOSIS — M51362 Other intervertebral disc degeneration, lumbar region with discogenic back pain and lower extremity pain: Secondary | ICD-10-CM | POA: Diagnosis not present

## 2023-12-24 DIAGNOSIS — I10 Essential (primary) hypertension: Secondary | ICD-10-CM | POA: Diagnosis not present

## 2023-12-24 DIAGNOSIS — E559 Vitamin D deficiency, unspecified: Secondary | ICD-10-CM | POA: Diagnosis not present

## 2024-01-10 ENCOUNTER — Ambulatory Visit: Admission: EM | Admit: 2024-01-10 | Discharge: 2024-01-10 | Disposition: A | Discharge status: Home / Self Care

## 2024-01-10 DIAGNOSIS — W57XXXA Bitten or stung by nonvenomous insect and other nonvenomous arthropods, initial encounter: Secondary | ICD-10-CM | POA: Diagnosis not present

## 2024-01-10 DIAGNOSIS — L03114 Cellulitis of left upper limb: Secondary | ICD-10-CM | POA: Diagnosis not present

## 2024-01-10 DIAGNOSIS — S60862A Insect bite (nonvenomous) of left wrist, initial encounter: Secondary | ICD-10-CM | POA: Diagnosis not present

## 2024-01-10 DIAGNOSIS — R194 Change in bowel habit: Secondary | ICD-10-CM | POA: Insufficient documentation

## 2024-01-10 DIAGNOSIS — K5730 Diverticulosis of large intestine without perforation or abscess without bleeding: Secondary | ICD-10-CM | POA: Insufficient documentation

## 2024-01-10 MED ORDER — SULFAMETHOXAZOLE-TRIMETHOPRIM 800-160 MG PO TABS
1.0000 | ORAL_TABLET | Freq: Two times a day (BID) | ORAL | 0 refills | Status: AC
Start: 2024-01-10 — End: 2024-01-17

## 2024-01-10 NOTE — ED Triage Notes (Signed)
Patient is waiting in car

## 2024-01-10 NOTE — ED Triage Notes (Signed)
 I have an insect bite/sting on the outside of my left wrist making my arm/hand swell up now, it happened maybe 1-2 days ago. No fever.

## 2024-01-10 NOTE — ED Triage Notes (Signed)
 Last Glucose: 0800am was 222.
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
## 2024-01-13 ENCOUNTER — Encounter: Payer: Self-pay | Admitting: Podiatry

## 2024-01-13 ENCOUNTER — Ambulatory Visit (INDEPENDENT_AMBULATORY_CARE_PROVIDER_SITE_OTHER): Admitting: Podiatry

## 2024-01-13 DIAGNOSIS — B351 Tinea unguium: Secondary | ICD-10-CM

## 2024-01-13 DIAGNOSIS — M79675 Pain in left toe(s): Secondary | ICD-10-CM | POA: Diagnosis not present

## 2024-01-13 DIAGNOSIS — E1165 Type 2 diabetes mellitus with hyperglycemia: Secondary | ICD-10-CM | POA: Diagnosis not present

## 2024-01-13 DIAGNOSIS — M79674 Pain in right toe(s): Secondary | ICD-10-CM

## 2024-01-13 NOTE — Progress Notes (Signed)
This patient returns to my office for at risk foot care.  This patient requires this care by a professional since this patient will be at risk due to having diabetes.  This patient is unable to cut nails herself since the patient cannot reach her nails.These nails are painful walking and wearing shoes.  This patient presents for at risk foot care today.  General Appearance  Alert, conversant and in no acute stress.  Vascular  Dorsalis pedis and posterior tibial  pulses are palpable  bilaterally.  Capillary return is within normal limits  bilaterally. Temperature is within normal limits  bilaterally.  Neurologic  Senn-Weinstein monofilament wire test within normal limits  bilaterally. Muscle power within normal limits bilaterally.  Nails Thick disfigured discolored nails with subungual debris  from hallux to fifth toes bilaterally. No evidence of bacterial infection or drainage bilaterally.  Orthopedic  No limitations of motion  feet .  No crepitus or effusions noted.  No bony pathology or digital deformities noted.  Skin  normotropic skin with no porokeratosis noted bilaterally.  No signs of infections or ulcers noted.     Onychomycosis  Pain in right toes  Pain in left toes  Consent was obtained for treatment procedures.   Mechanical debridement of nails 1-5  bilaterally performed with a nail nipper.  Filed with dremel without incident.    Return office visit     10 weeks                 Told patient to return for periodic foot care and evaluation due to potential at risk complications.   Lennyn Bellanca DPM   

## 2024-01-15 ENCOUNTER — Telehealth: Payer: Self-pay

## 2024-01-15 NOTE — Progress Notes (Signed)
   01/15/2024  Patient ID: Breanna Berger, female   DOB: [birth year], 63 y.o.   MRN: [id number]  Contacted patient to follow up on referral for Diabetes management from Catalina Bare, MD.  The patient was a no show for her last 2 PCP follow up appointments on 12/31/23 and 01/14/24. I was supposed to meet with the patient for a pharmacy follow up post-PCP appointment.   I was unable to reach the patient today. I left a HIPAA compliant voicemail for the patient to return my phone call.   Heather Factor, PharmD Clinical Pharmacist  [phone]

## 2024-01-17 DIAGNOSIS — Z79891 Long term (current) use of opiate analgesic: Secondary | ICD-10-CM | POA: Diagnosis not present

## 2024-01-17 DIAGNOSIS — G8929 Other chronic pain: Secondary | ICD-10-CM | POA: Diagnosis not present

## 2024-01-17 DIAGNOSIS — M545 Low back pain, unspecified: Secondary | ICD-10-CM | POA: Diagnosis not present

## 2024-01-17 DIAGNOSIS — M25561 Pain in right knee: Secondary | ICD-10-CM | POA: Diagnosis not present

## 2024-01-17 DIAGNOSIS — G894 Chronic pain syndrome: Secondary | ICD-10-CM | POA: Diagnosis not present

## 2024-01-17 DIAGNOSIS — M79672 Pain in left foot: Secondary | ICD-10-CM | POA: Diagnosis not present

## 2024-01-20 DIAGNOSIS — E1165 Type 2 diabetes mellitus with hyperglycemia: Secondary | ICD-10-CM | POA: Diagnosis not present

## 2024-01-20 DIAGNOSIS — N3000 Acute cystitis without hematuria: Secondary | ICD-10-CM | POA: Diagnosis not present

## 2024-01-20 DIAGNOSIS — Z794 Long term (current) use of insulin: Secondary | ICD-10-CM | POA: Diagnosis not present

## 2024-01-28 NOTE — Progress Notes (Signed)
   01/28/2024  Patient ID: Breanna Berger, female   DOB: 1961-01-30, 63 y.o.   MRN: [id number]  Contacted patient to follow up on referral for Diabetes management from Catalina Bare, MD.  The patient was a no show for her last 2 PCP follow up appointments on 12/31/23 and 01/14/24. I was supposed to meet with the patient for a pharmacy follow up post-PCP appointment.   I was able to reach the patient today. She has an upcoming PCP appointment on 02/18/24. We will meet after that appointment. Appointment scheduled for 02/18/24 at 3:00 pm.  Heather Factor, PharmD Clinical Pharmacist  [phone]

## 2024-02-04 DIAGNOSIS — R300 Dysuria: Secondary | ICD-10-CM | POA: Diagnosis not present

## 2024-02-04 DIAGNOSIS — M62830 Muscle spasm of back: Secondary | ICD-10-CM | POA: Diagnosis not present

## 2024-02-07 DIAGNOSIS — R300 Dysuria: Secondary | ICD-10-CM | POA: Diagnosis not present

## 2024-02-14 DIAGNOSIS — M79672 Pain in left foot: Secondary | ICD-10-CM | POA: Diagnosis not present

## 2024-02-14 DIAGNOSIS — G8929 Other chronic pain: Secondary | ICD-10-CM | POA: Diagnosis not present

## 2024-02-14 DIAGNOSIS — M545 Low back pain, unspecified: Secondary | ICD-10-CM | POA: Diagnosis not present

## 2024-02-18 ENCOUNTER — Telehealth: Payer: Self-pay

## 2024-02-18 DIAGNOSIS — E1165 Type 2 diabetes mellitus with hyperglycemia: Secondary | ICD-10-CM | POA: Diagnosis not present

## 2024-02-18 DIAGNOSIS — E559 Vitamin D deficiency, unspecified: Secondary | ICD-10-CM | POA: Diagnosis not present

## 2024-02-18 DIAGNOSIS — I10 Essential (primary) hypertension: Secondary | ICD-10-CM | POA: Diagnosis not present

## 2024-02-18 DIAGNOSIS — Z794 Long term (current) use of insulin: Secondary | ICD-10-CM | POA: Diagnosis not present

## 2024-02-18 DIAGNOSIS — E782 Mixed hyperlipidemia: Secondary | ICD-10-CM | POA: Diagnosis not present

## 2024-02-18 NOTE — Progress Notes (Signed)
 02/18/2024 Name: Breanna Berger MRN: [id number] DOB: 1960/12/24  Chief Complaint  Patient presents with   Diabetes    Breanna Berger is a 63 y.o. year old female who was referred for medication management by their primary care provider, Breanna Berger. They presented for a face to face visit today.   They were referred to the pharmacist by their PCP for assistance in managing diabetes    Subjective:  Care Team: Primary Care Provider: Catalina Bare, Berger; Next Scheduled Visit: 03/03/24 {careteamprovider:[id number]}  Medication Access/Adherence  Current Pharmacy:  [id number] - [place], [place] - [address] AT [facility] [address] [place] [address] Phone: [phone] Fax: [phone]  [facility] - [place], [place] - [address] [place] [address] Phone: [phone] Fax: [phone]   Patient reports affordability concerns with their medications: No  Patient reports access/transportation concerns to their pharmacy: No  Patient reports adherence concerns with their medications:  No  Pt is consistent with most medications, but has not been taking her ACEI   Diabetes: Patient adjusts insulin  dosages based on meal intakes, takes 10u of Humulin R  with larger meals, has been taking 32 u Lantus  at bedtime. Taking Metformin  as prescribed   Current medications: *** Medications tried in the past: ***  Current glucose readings: FBG: 180-220, PPBG: 300-350    Patient denies hypoglycemic s/sx including dizziness, shakiness, sweating. Patient reports hyperglycemic symptoms including polyuria, polydipsia, polyphagia, nocturia.  Current meal patterns: patient continues to eat one large meal daily (late lunch/dinner) - Breakfast: coffee, sugar-free creamer, maybe a piece of fruit - Lunch: tuna sandwich, piece of fruit - Supper: baked chicken, beans and rice,  mashed potatoes, broccoli  - Snacks cookies, candy bar,  - Drinks: Tea (more unsweetened), Lemonade, Water 16 oz daily   Current physical activity: mobility issues - knee and ankle issues, has looked into chair exercises  Current medication access support: Patient reports zero or low copays  Macrovascular and Microvascular Risk Reduction:  Statin? yes (Atorvastatin 40mg ); ACEi/ARB? yes (Lisinopril 10mg ) Last urinary albumin/creatinine ratio: 135 Last eye exam:   Last foot exam: No foot exam found Tobacco Use:  Tobacco Use: Low Risk  (02/04/2024)   Received from [facility]   Patient History    Smoking Tobacco Use: Never    Smokeless Tobacco Use: Never    Passive Exposure: Not on Berger  Recent Concern: Tobacco Use - Medium Risk (01/13/2024)   Patient History    Smoking Tobacco Use: Former    Smokeless Tobacco Use: Never    Passive Exposure: Not on Berger   Hypertension:  Current medications: Metoprolol  ER 50 mg daily, *Lisinopril 10 mg daily *patient not taking  Medications previously tried:   Patient has a validated, automated, upper arm home BP cuff Current blood pressure readings readings: 137/80   Patient denies hypotensive s/sx including dizziness, lightheadedness.  Patient denies hypertensive symptoms including headache, chest pain, shortness of breath   Hyperlipidemia/ASCVD Risk Reduction  Current lipid lowering medications: Atorvastatin 40 mg daily  Medications tried in the past:   Antiplatelet regimen:   ASCVD History:  Family History:  Risk Factors:   Current physical activity: ***  Current medication access support: ***  PREVENT Risk Score:  [email or web address].fr - 10 year risk of CVD: *** - 10 year risk of ASCVD: *** - 10 year risk of HF: ***   Objective:     Medications Reviewed Today  Reviewed by Breanna Berger, [facility] (Pharmacist) on 02/18/24 at 1644  Med  List Status: <None>   Medication Order Taking? Sig Documenting Provider Last Dose Status Informant  albuterol  (PROVENTIL  HFA;VENTOLIN  HFA) 108 (90 BASE) MCG/ACT inhaler [id number] Yes Inhale 2 puffs into the lungs every 6 (six) hours as needed for wheezing or shortness of breath. Breanna Berger  Active Self           Med Note Berger, Breanna I   Fri Nov 06, 2019  8:27 AM) PRN  albuterol  (PROVENTIL ) (2.5 MG/3ML) 0.083% nebulizer solution [id number] Yes Take 2.5 mg by nebulization every 6 (six) hours as needed for wheezing or shortness of breath. Provider, Historical, Berger  Active   aspirin 81 MG chewable tablet [id number] Yes  Provider, Historical, Berger  Active   atorvastatin (LIPITOR) 40 MG tablet [id number] Yes Take 40 mg by mouth daily. Provider, Historical, Berger  Active   Blood Glucose Monitoring Suppl (ACCU-CHEK GUIDE ME) w/Device KIT [id number] Yes Use to check blood sugar 3 times a day Breanna File, Berger  Active   cyclobenzaprine  (FLEXERIL ) 10 MG tablet [id number] Yes Take 10 mg by mouth as directed.  Patient taking differently: Take 10 mg by mouth daily as needed.   Provider, Historical, Berger  Active   diclofenac Sodium (VOLTAREN) 1 % GEL [id number] Yes Apply topically as needed. Provider, Historical, Berger  Active     Discontinued 02/18/24 1626    Patient not taking:   Discontinued 02/18/24 1627 (Discontinued by provider)   fluticasone  (FLONASE ) 50 MCG/ACT nasal spray [id number] Yes 2 sprays Once Daily.  Patient taking differently: daily as needed.   Provider, Historical, Berger  Active   fluticasone -salmeterol (ADVAIR) 250-50 MCG/ACT AEPB [id number]  Inhale 1 puff into the lungs in the morning and at bedtime.  Patient not taking: Reported on 12/17/2023   Provider, Historical, Berger  Active   glucose blood (ACCU-CHEK GUIDE) test strip [id number] Yes Use to check blood sugar 3 times a day. Breanna File, Berger  Active   Insulin  Pen Needle (BD PEN NEEDLE MICRO U/F) 32G X 6 MM MISC [id number] Yes 1 Device  by Does not apply route daily in the afternoon. Breanna Berger  Active   insulin  regular (NOVOLIN R) 100 units/mL injection [id number] Yes Inject 5 Units into the skin 3 (three) times daily before meals. Provider, Historical, Berger  Active   LANTUS  SOLOSTAR 100 UNIT/ML Solostar Pen [id number] Yes Inject 36 Units into the skin at bedtime.  Patient taking differently: Inject 32 Units into the skin at bedtime.   Shamleffer, Ibtehal Jaralla, Berger  Active   linaclotide [facility]) 145 MCG CAPS capsule [id number] Yes Take 1 capsule by mouth daily.  Patient taking differently: Take 1 capsule by mouth daily as needed.   Provider, Historical, Berger  Active     Discontinued 02/18/24 1639   metFORMIN  (GLUCOPHAGE ) 1000 MG tablet [id number] Yes Take 1 tablet (1,000 mg total) by mouth 2 (two) times daily with a meal. Shamleffer, Ibtehal Jaralla, Berger  Active   metoprolol  succinate (TOPROL -XL) 50 MG 24 hr tablet [id number] Yes Take 50 mg by mouth daily. Take with or immediately following a meal. Provider, Historical, Berger  Active Self  nitroGLYCERIN (NITROSTAT) 0.4 MG SL tablet [id number]    Patient not taking: Reported on 02/18/2024   Provider, Historical, Berger  Active    Discontinued 02/18/24 1643 (Completed Course)   oxyCODONE -acetaminophen  (PERCOCET) 7.5-325 MG tablet [id number] Yes Take 1 tablet by mouth 4 (four) times daily.  Provider, Historical, Berger  Active               Assessment/Plan:   Diabetes: - Currently uncontrolled; goal A1c <7%. Cardiorenal risk reduction is opportunities for improvement.. Blood pressure is not at goal <130/80. LDL is at goal.  - Reviewed long term cardiovascular and renal outcomes of uncontrolled blood sugar., Reviewed goal A1c, goal fasting, and goal 2 hour post prandial glucose. Recommended to check glucose ***, Reviewed dietary modifications including ***., Reviewed lifestyle modifications including ***., and Reviewed signs and symptoms of hypoglycemia. - {start stop  continue:33488} - Patient denies personal or family history of multiple endocrine neoplasia type 2, medullary thyroid  cancer; personal history of pancreatitis or gallbladder disease., Discussed side effects of gastrointestinal upset/nausea; eating smaller meals, avoiding high-fat foods, and remaining upright after eating may reduce nausea. Discussed that overeating is a major trigger of nausea with this class of medications, as often times patients will start to feel full sooner and may need to decrease portion sizes from what they were previously accustomed to.   Hypertension: - Currently uncontrolled - Reviewed long term cardiovascular and renal outcomes of uncontrolled blood pressure - Reviewed appropriate blood pressure monitoring technique and reviewed goal blood pressure. Recommended to check home blood pressure and heart rate *** - Recommend to ***      Hyperlipidemia/ASCVD Risk Reduction: - Currently controlled.  - Reviewed long term complications of uncontrolled cholesterol - Reviewed dietary recommendations including *** - Reviewed lifestyle recommendations including *** - Recommend to ***  - Meets financial criteria for *** patient assistance program through ***. Will collaborate with provider, CPhT, and patient to pursue assistance.    Follow Up Plan: Will follow up with patient in 2 weeks on 03/03/24  ***

## 2024-03-04 ENCOUNTER — Telehealth: Payer: Self-pay

## 2024-03-04 NOTE — Progress Notes (Signed)
   03/04/24  Patient ID: Breanna Berger, female   DOB: [birth year], 63 y.o.   MRN: [id number]  Contacted patient regarding referral for diabetes from Catalina Bare, MD.  Appointment scheduled for 03/04/23.   The patient agreed to meet with me via telephone encounter today, but I was unable to reach her. I left a HIPAA compliant voicemail for the patient to return my call.   Heather Factor, PharmD Clinical Pharmacist  [phone]

## 2024-03-04 NOTE — Progress Notes (Signed)
   03/03/24  Patient ID: Breanna Berger, female   DOB: [birth year], 63 y.o.   MRN: [id number]  Contacted patient regarding referral for diabetes from Catalina Bare, MD.  Appointment scheduled for 03/04/23.   Patient was supposed to meet with me after her PCP appointment for CGM training. The patient did not stay for our appointment, but did receive 2 more Freestyle Libre 3 Plus sensors from the front desk.   The patient agreed to meet with me via telephone encounter tomorrow.   Heather Factor, PharmD Clinical Pharmacist  [phone]

## 2024-03-18 NOTE — Progress Notes (Signed)
   03/18/24  Patient ID: Breanna Berger, female   DOB: 1961/01/17, 63 y.o.   MRN: [id number]  Contacted patient regarding referral for diabetes from Osei-Bonsu, Zachary, MD.  She was not able to speak with me at this time but is still having issues with her CGM. She will give me a call back to reschedule her appointment.  Heather Factor, PharmD Clinical Pharmacist  [phone]

## 2024-03-19 DIAGNOSIS — M545 Low back pain, unspecified: Secondary | ICD-10-CM | POA: Diagnosis not present

## 2024-03-19 DIAGNOSIS — M25561 Pain in right knee: Secondary | ICD-10-CM | POA: Diagnosis not present

## 2024-03-19 DIAGNOSIS — G8929 Other chronic pain: Secondary | ICD-10-CM | POA: Diagnosis not present

## 2024-03-23 ENCOUNTER — Ambulatory Visit: Admitting: Podiatry

## 2024-05-13 ENCOUNTER — Encounter: Payer: Self-pay | Admitting: *Deleted

## 2024-05-13 NOTE — Progress Notes (Signed)
 Breanna Berger                                          MRN: [id number]   05/13/2024   The [facility] Quality Team Specialist reviewed this patient medical record for the purposes of chart review for care gap closure. The following were reviewed: chart review for care gap closure-diabetic eye exam and glycemic status assessment.    [facility]BCI Quality Team

## 2024-05-26 ENCOUNTER — Telehealth: Payer: Self-pay

## 2024-05-26 NOTE — Progress Notes (Signed)
° °  05/26/2024  Patient ID: Breanna Berger, female   DOB: [birth year], 63 y.o.   MRN: [id number]  Contacted patient regarding follow up for diabetes from Osei-Bonsu, Zachary, MD.  I could not leave a voicemail due to the patient's voicemail being full.   A third unsuccesful telephone outreach was attempted today to contact the patient who was referred to the pharmacy for assistance with diabetes management. The Population Health team is pleased to engage with this patient at any time in the future upon receipt of referral and should he/she be interested in assistance from the [facility] team.   Heather Factor, PharmD Clinical Pharmacist  [phone]
